# Patient Record
Sex: Female | Born: 1945
Health system: Southern US, Community
[De-identification: ages and names within clinical notes are randomized; demographics above are authoritative.]

## PROBLEM LIST (undated history)

## (undated) ENCOUNTER — Ambulatory Visit: Source: Home / Self Care

## (undated) DIAGNOSIS — J45909 Unspecified asthma, uncomplicated: Secondary | ICD-10-CM

## (undated) DIAGNOSIS — F419 Anxiety disorder, unspecified: Secondary | ICD-10-CM

## (undated) DIAGNOSIS — M109 Gout, unspecified: Secondary | ICD-10-CM

## (undated) DIAGNOSIS — F329 Major depressive disorder, single episode, unspecified: Secondary | ICD-10-CM

## (undated) DIAGNOSIS — M549 Dorsalgia, unspecified: Secondary | ICD-10-CM

## (undated) DIAGNOSIS — M779 Enthesopathy, unspecified: Secondary | ICD-10-CM

## (undated) DIAGNOSIS — N182 Chronic kidney disease, stage 2 (mild): Secondary | ICD-10-CM

## (undated) DIAGNOSIS — R7303 Prediabetes: Secondary | ICD-10-CM

## (undated) DIAGNOSIS — R011 Cardiac murmur, unspecified: Secondary | ICD-10-CM

## (undated) DIAGNOSIS — D696 Thrombocytopenia, unspecified: Secondary | ICD-10-CM

## (undated) DIAGNOSIS — E669 Obesity, unspecified: Secondary | ICD-10-CM

## (undated) DIAGNOSIS — M1990 Unspecified osteoarthritis, unspecified site: Secondary | ICD-10-CM

## (undated) DIAGNOSIS — I10 Essential (primary) hypertension: Secondary | ICD-10-CM

## (undated) DIAGNOSIS — E785 Hyperlipidemia, unspecified: Secondary | ICD-10-CM

## (undated) DIAGNOSIS — R0602 Shortness of breath: Secondary | ICD-10-CM

## (undated) DIAGNOSIS — R12 Heartburn: Secondary | ICD-10-CM

## (undated) DIAGNOSIS — L309 Dermatitis, unspecified: Secondary | ICD-10-CM

## (undated) DIAGNOSIS — M2550 Pain in unspecified joint: Secondary | ICD-10-CM

## (undated) DIAGNOSIS — F32A Depression, unspecified: Secondary | ICD-10-CM

## (undated) HISTORY — DX: Pain in unspecified joint: M25.50

## (undated) HISTORY — DX: Obesity, unspecified: E66.9

## (undated) HISTORY — DX: Dorsalgia, unspecified: M54.9

## (undated) HISTORY — DX: Chronic kidney disease, stage 2 (mild): N18.2

## (undated) HISTORY — DX: Enthesopathy, unspecified: M77.9

## (undated) HISTORY — DX: Prediabetes: R73.03

## (undated) HISTORY — DX: Shortness of breath: R06.02

## (undated) HISTORY — DX: Unspecified osteoarthritis, unspecified site: M19.90

## (undated) HISTORY — DX: Heartburn: R12

---

## 1989-04-08 HISTORY — PX: ABDOMINAL HYSTERECTOMY: SHX81

## 1999-01-19 ENCOUNTER — Encounter: Admission: RE | Admit: 1999-01-19 | Discharge: 1999-01-19 | Payer: Self-pay | Admitting: *Deleted

## 1999-11-13 ENCOUNTER — Encounter: Admission: RE | Admit: 1999-11-13 | Discharge: 2000-02-11 | Payer: Self-pay | Admitting: *Deleted

## 2000-02-11 ENCOUNTER — Encounter: Admission: RE | Admit: 2000-02-11 | Discharge: 2000-02-11 | Payer: Self-pay | Admitting: *Deleted

## 2000-02-11 ENCOUNTER — Encounter: Payer: Self-pay | Admitting: *Deleted

## 2000-11-04 ENCOUNTER — Other Ambulatory Visit: Admission: RE | Admit: 2000-11-04 | Discharge: 2000-11-04 | Payer: Self-pay | Admitting: *Deleted

## 2001-01-30 HISTORY — PX: NM MYOCAR PERF WALL MOTION: HXRAD629

## 2001-02-20 ENCOUNTER — Encounter: Admission: RE | Admit: 2001-02-20 | Discharge: 2001-02-20 | Payer: Self-pay | Admitting: *Deleted

## 2001-02-20 ENCOUNTER — Encounter: Payer: Self-pay | Admitting: *Deleted

## 2001-11-23 ENCOUNTER — Encounter: Admission: RE | Admit: 2001-11-23 | Discharge: 2001-11-23 | Payer: Self-pay | Admitting: Family Medicine

## 2001-11-23 ENCOUNTER — Encounter: Payer: Self-pay | Admitting: Family Medicine

## 2002-02-22 ENCOUNTER — Encounter: Admission: RE | Admit: 2002-02-22 | Discharge: 2002-02-22 | Payer: Self-pay | Admitting: Family Medicine

## 2002-02-22 ENCOUNTER — Encounter: Payer: Self-pay | Admitting: Family Medicine

## 2003-02-08 ENCOUNTER — Encounter: Admission: RE | Admit: 2003-02-08 | Discharge: 2003-05-09 | Payer: Self-pay | Admitting: Family Medicine

## 2003-02-25 ENCOUNTER — Encounter: Admission: RE | Admit: 2003-02-25 | Discharge: 2003-02-25 | Payer: Self-pay | Admitting: Family Medicine

## 2003-04-07 ENCOUNTER — Ambulatory Visit: Admission: RE | Admit: 2003-04-07 | Discharge: 2003-04-07 | Payer: Self-pay | Admitting: Family Medicine

## 2003-05-11 ENCOUNTER — Encounter: Admission: RE | Admit: 2003-05-11 | Discharge: 2003-05-11 | Payer: Self-pay | Admitting: Family Medicine

## 2004-03-02 ENCOUNTER — Encounter: Admission: RE | Admit: 2004-03-02 | Discharge: 2004-03-02 | Payer: Self-pay | Admitting: Family Medicine

## 2004-12-19 ENCOUNTER — Encounter: Admission: RE | Admit: 2004-12-19 | Discharge: 2004-12-19 | Payer: Self-pay | Admitting: Family Medicine

## 2005-12-23 ENCOUNTER — Encounter: Admission: RE | Admit: 2005-12-23 | Discharge: 2005-12-23 | Payer: Self-pay | Admitting: Family Medicine

## 2006-01-25 ENCOUNTER — Emergency Department (HOSPITAL_COMMUNITY): Admission: EM | Admit: 2006-01-25 | Discharge: 2006-01-25 | Payer: Self-pay | Admitting: Emergency Medicine

## 2006-02-20 ENCOUNTER — Ambulatory Visit: Payer: Self-pay | Admitting: Hematology and Oncology

## 2006-03-05 LAB — CBC WITH DIFFERENTIAL/PLATELET
BASO%: 0.2 % (ref 0.0–2.0)
Basophils Absolute: 0 10*3/uL (ref 0.0–0.1)
EOS%: 0.9 % (ref 0.0–7.0)
Eosinophils Absolute: 0.1 10*3/uL (ref 0.0–0.5)
HCT: 38.6 % (ref 34.8–46.6)
HGB: 13.1 g/dL (ref 11.6–15.9)
LYMPH%: 19.4 % (ref 14.0–48.0)
MCH: 27.7 pg (ref 26.0–34.0)
MCHC: 33.8 g/dL (ref 32.0–36.0)
MCV: 81.9 fL (ref 81.0–101.0)
MONO#: 0.6 10*3/uL (ref 0.1–0.9)
MONO%: 6.5 % (ref 0.0–13.0)
NEUT#: 6.4 10*3/uL (ref 1.5–6.5)
NEUT%: 73 % (ref 39.6–76.8)
Platelets: 502 10*3/uL — ABNORMAL HIGH (ref 145–400)
RBC: 4.71 10*6/uL (ref 3.70–5.32)
RDW: 14.9 % — ABNORMAL HIGH (ref 11.3–14.5)
WBC: 8.7 10*3/uL (ref 3.9–10.0)
lymph#: 1.7 10*3/uL (ref 0.9–3.3)

## 2006-03-05 LAB — ERYTHROCYTE SEDIMENTATION RATE: Sed Rate: 39 mm/hr (ref 0–30)

## 2006-03-08 HISTORY — PX: DOPPLER ECHOCARDIOGRAPHY: SHX263

## 2006-03-11 LAB — BCR/ABL GENE REARRANGEMENT QNT, PCR
BCR/ABL Quantitative 1: NEGATIVE
BCR/ABL ratio: 0

## 2006-03-11 LAB — COMPREHENSIVE METABOLIC PANEL
ALT: 17 U/L (ref 0–35)
AST: 19 U/L (ref 0–37)
Albumin: 4.5 g/dL (ref 3.5–5.2)
Alkaline Phosphatase: 83 U/L (ref 39–117)
BUN: 17 mg/dL (ref 6–23)
CO2: 30 mEq/L (ref 19–32)
Calcium: 10.6 mg/dL — ABNORMAL HIGH (ref 8.4–10.5)
Chloride: 99 mEq/L (ref 96–112)
Creatinine, Ser: 0.84 mg/dL (ref 0.40–1.20)
Glucose, Bld: 96 mg/dL (ref 70–99)
Potassium: 3.5 mEq/L (ref 3.5–5.3)
Sodium: 140 mEq/L (ref 135–145)
Total Bilirubin: 0.6 mg/dL (ref 0.3–1.2)
Total Protein: 8.6 g/dL — ABNORMAL HIGH (ref 6.0–8.3)

## 2006-03-11 LAB — LACTATE DEHYDROGENASE: LDH: 190 U/L (ref 94–250)

## 2006-03-11 LAB — JAK2 GENOTYPR

## 2006-03-19 LAB — VON WILLEBRAND FACTOR MULTIMER
Factor-VIII Activity: 132 % (ref 75–150)
Ristocetin-Cofactor: 49 % — ABNORMAL LOW (ref 50–150)
Von Willebrand Ag: 98 % normal (ref 61–164)
Von Willebrand Multimers: NORMAL

## 2006-09-10 ENCOUNTER — Ambulatory Visit: Payer: Self-pay | Admitting: Hematology and Oncology

## 2006-09-12 LAB — BASIC METABOLIC PANEL
BUN: 12 mg/dL (ref 6–23)
CO2: 28 mEq/L (ref 19–32)
Calcium: 10.7 mg/dL — ABNORMAL HIGH (ref 8.4–10.5)
Chloride: 99 mEq/L (ref 96–112)
Creatinine, Ser: 0.8 mg/dL (ref 0.40–1.20)
Glucose, Bld: 75 mg/dL (ref 70–99)
Potassium: 3.5 mEq/L (ref 3.5–5.3)
Sodium: 139 mEq/L (ref 135–145)

## 2006-09-12 LAB — CBC WITH DIFFERENTIAL/PLATELET
BASO%: 1.2 % (ref 0.0–2.0)
Basophils Absolute: 0.1 10*3/uL (ref 0.0–0.1)
EOS%: 2 % (ref 0.0–7.0)
Eosinophils Absolute: 0.2 10*3/uL (ref 0.0–0.5)
HCT: 37 % (ref 34.8–46.6)
HGB: 12.7 g/dL (ref 11.6–15.9)
LYMPH%: 21.5 % (ref 14.0–48.0)
MCH: 27.4 pg (ref 26.0–34.0)
MCHC: 34.4 g/dL (ref 32.0–36.0)
MCV: 79.9 fL — ABNORMAL LOW (ref 81.0–101.0)
MONO#: 0.6 10*3/uL (ref 0.1–0.9)
MONO%: 7.5 % (ref 0.0–13.0)
NEUT#: 5.5 10*3/uL (ref 1.5–6.5)
NEUT%: 67.8 % (ref 39.6–76.8)
Platelets: 471 10*3/uL — ABNORMAL HIGH (ref 145–400)
RBC: 4.63 10*6/uL (ref 3.70–5.32)
RDW: 15.2 % — ABNORMAL HIGH (ref 11.3–14.5)
WBC: 8.2 10*3/uL (ref 3.9–10.0)
lymph#: 1.8 10*3/uL (ref 0.9–3.3)

## 2006-12-25 ENCOUNTER — Encounter: Admission: RE | Admit: 2006-12-25 | Discharge: 2006-12-25 | Payer: Self-pay | Admitting: Family Medicine

## 2007-03-11 ENCOUNTER — Ambulatory Visit: Payer: Self-pay | Admitting: Hematology and Oncology

## 2007-03-13 LAB — CBC WITH DIFFERENTIAL/PLATELET
BASO%: 0.3 % (ref 0.0–2.0)
Basophils Absolute: 0 10*3/uL (ref 0.0–0.1)
EOS%: 1.3 % (ref 0.0–7.0)
Eosinophils Absolute: 0.1 10*3/uL (ref 0.0–0.5)
HCT: 35.4 % (ref 34.8–46.6)
HGB: 12.4 g/dL (ref 11.6–15.9)
LYMPH%: 23.7 % (ref 14.0–48.0)
MCH: 28.1 pg (ref 26.0–34.0)
MCHC: 35 g/dL (ref 32.0–36.0)
MCV: 80.5 fL — ABNORMAL LOW (ref 81.0–101.0)
MONO#: 0.5 10*3/uL (ref 0.1–0.9)
MONO%: 6.5 % (ref 0.0–13.0)
NEUT#: 5.6 10*3/uL (ref 1.5–6.5)
NEUT%: 68.2 % (ref 39.6–76.8)
Platelets: 408 10*3/uL — ABNORMAL HIGH (ref 145–400)
RBC: 4.4 10*6/uL (ref 3.70–5.32)
RDW: 14.6 % — ABNORMAL HIGH (ref 11.3–14.5)
WBC: 8.1 10*3/uL (ref 3.9–10.0)
lymph#: 1.9 10*3/uL (ref 0.9–3.3)

## 2007-03-13 LAB — BASIC METABOLIC PANEL
BUN: 11 mg/dL (ref 6–23)
CO2: 30 mEq/L (ref 19–32)
Calcium: 9.9 mg/dL (ref 8.4–10.5)
Chloride: 98 mEq/L (ref 96–112)
Creatinine, Ser: 0.75 mg/dL (ref 0.40–1.20)
Glucose, Bld: 94 mg/dL (ref 70–99)
Potassium: 3.7 mEq/L (ref 3.5–5.3)
Sodium: 137 mEq/L (ref 135–145)

## 2007-03-13 LAB — HEMOGLOBIN A1C: Hgb A1c MFr Bld: 6.1 % (ref 4.6–6.1)

## 2007-03-17 LAB — ANA: Anti Nuclear Antibody(ANA): NEGATIVE

## 2007-12-29 ENCOUNTER — Encounter: Admission: RE | Admit: 2007-12-29 | Discharge: 2007-12-29 | Payer: Self-pay | Admitting: Family Medicine

## 2008-12-30 ENCOUNTER — Encounter: Admission: RE | Admit: 2008-12-30 | Discharge: 2008-12-30 | Payer: Self-pay | Admitting: Internal Medicine

## 2010-01-03 ENCOUNTER — Encounter: Admission: RE | Admit: 2010-01-03 | Discharge: 2010-01-03 | Payer: Self-pay | Admitting: Internal Medicine

## 2010-04-28 ENCOUNTER — Encounter: Payer: Self-pay | Admitting: Family Medicine

## 2010-12-31 ENCOUNTER — Other Ambulatory Visit: Payer: Self-pay | Admitting: Internal Medicine

## 2010-12-31 DIAGNOSIS — Z1231 Encounter for screening mammogram for malignant neoplasm of breast: Secondary | ICD-10-CM

## 2011-01-24 ENCOUNTER — Ambulatory Visit
Admission: RE | Admit: 2011-01-24 | Discharge: 2011-01-24 | Disposition: A | Discharge status: Home / Self Care | Payer: BC Managed Care – PPO | Source: Ambulatory Visit | Attending: Internal Medicine | Admitting: Internal Medicine

## 2011-01-24 DIAGNOSIS — Z1231 Encounter for screening mammogram for malignant neoplasm of breast: Secondary | ICD-10-CM

## 2011-12-31 ENCOUNTER — Other Ambulatory Visit: Payer: Self-pay | Admitting: Internal Medicine

## 2011-12-31 DIAGNOSIS — Z1231 Encounter for screening mammogram for malignant neoplasm of breast: Secondary | ICD-10-CM

## 2012-01-16 HISTORY — PX: DOPPLER ECHOCARDIOGRAPHY: SHX263

## 2012-01-30 ENCOUNTER — Ambulatory Visit
Admission: RE | Admit: 2012-01-30 | Discharge: 2012-01-30 | Disposition: A | Discharge status: Home / Self Care | Payer: BC Managed Care – PPO | Source: Ambulatory Visit | Attending: Internal Medicine | Admitting: Internal Medicine

## 2012-01-30 DIAGNOSIS — Z1231 Encounter for screening mammogram for malignant neoplasm of breast: Secondary | ICD-10-CM

## 2012-04-10 ENCOUNTER — Emergency Department (HOSPITAL_COMMUNITY): Payer: BC Managed Care – PPO

## 2012-04-10 ENCOUNTER — Emergency Department (HOSPITAL_COMMUNITY)
Admission: EM | Admit: 2012-04-10 | Discharge: 2012-04-10 | Disposition: A | Discharge status: Home / Self Care | Payer: BC Managed Care – PPO | Attending: Emergency Medicine | Admitting: Emergency Medicine

## 2012-04-10 ENCOUNTER — Encounter (HOSPITAL_COMMUNITY): Payer: Self-pay | Admitting: Emergency Medicine

## 2012-04-10 DIAGNOSIS — F411 Generalized anxiety disorder: Secondary | ICD-10-CM | POA: Insufficient documentation

## 2012-04-10 DIAGNOSIS — R5381 Other malaise: Secondary | ICD-10-CM | POA: Insufficient documentation

## 2012-04-10 DIAGNOSIS — Z79899 Other long term (current) drug therapy: Secondary | ICD-10-CM | POA: Insufficient documentation

## 2012-04-10 DIAGNOSIS — R6883 Chills (without fever): Secondary | ICD-10-CM | POA: Insufficient documentation

## 2012-04-10 DIAGNOSIS — R42 Dizziness and giddiness: Secondary | ICD-10-CM | POA: Insufficient documentation

## 2012-04-10 DIAGNOSIS — J45909 Unspecified asthma, uncomplicated: Secondary | ICD-10-CM | POA: Insufficient documentation

## 2012-04-10 DIAGNOSIS — R05 Cough: Secondary | ICD-10-CM | POA: Insufficient documentation

## 2012-04-10 DIAGNOSIS — R197 Diarrhea, unspecified: Secondary | ICD-10-CM | POA: Insufficient documentation

## 2012-04-10 DIAGNOSIS — E785 Hyperlipidemia, unspecified: Secondary | ICD-10-CM | POA: Insufficient documentation

## 2012-04-10 DIAGNOSIS — R109 Unspecified abdominal pain: Secondary | ICD-10-CM | POA: Insufficient documentation

## 2012-04-10 DIAGNOSIS — I10 Essential (primary) hypertension: Secondary | ICD-10-CM | POA: Insufficient documentation

## 2012-04-10 DIAGNOSIS — Z872 Personal history of diseases of the skin and subcutaneous tissue: Secondary | ICD-10-CM | POA: Insufficient documentation

## 2012-04-10 DIAGNOSIS — D72829 Elevated white blood cell count, unspecified: Secondary | ICD-10-CM | POA: Insufficient documentation

## 2012-04-10 DIAGNOSIS — R5383 Other fatigue: Secondary | ICD-10-CM | POA: Insufficient documentation

## 2012-04-10 DIAGNOSIS — R059 Cough, unspecified: Secondary | ICD-10-CM | POA: Insufficient documentation

## 2012-04-10 DIAGNOSIS — R112 Nausea with vomiting, unspecified: Secondary | ICD-10-CM | POA: Insufficient documentation

## 2012-04-10 DIAGNOSIS — R51 Headache: Secondary | ICD-10-CM | POA: Insufficient documentation

## 2012-04-10 DIAGNOSIS — M109 Gout, unspecified: Secondary | ICD-10-CM | POA: Insufficient documentation

## 2012-04-10 DIAGNOSIS — R011 Cardiac murmur, unspecified: Secondary | ICD-10-CM | POA: Insufficient documentation

## 2012-04-10 DIAGNOSIS — Z8659 Personal history of other mental and behavioral disorders: Secondary | ICD-10-CM | POA: Insufficient documentation

## 2012-04-10 HISTORY — DX: Unspecified asthma, uncomplicated: J45.909

## 2012-04-10 HISTORY — DX: Gout, unspecified: M10.9

## 2012-04-10 HISTORY — DX: Essential (primary) hypertension: I10

## 2012-04-10 HISTORY — DX: Depression, unspecified: F32.A

## 2012-04-10 HISTORY — DX: Anxiety disorder, unspecified: F41.9

## 2012-04-10 HISTORY — DX: Dermatitis, unspecified: L30.9

## 2012-04-10 HISTORY — DX: Major depressive disorder, single episode, unspecified: F32.9

## 2012-04-10 HISTORY — DX: Hyperlipidemia, unspecified: E78.5

## 2012-04-10 HISTORY — DX: Thrombocytopenia, unspecified: D69.6

## 2012-04-10 HISTORY — DX: Cardiac murmur, unspecified: R01.1

## 2012-04-10 LAB — COMPREHENSIVE METABOLIC PANEL
ALT: 20 U/L (ref 0–35)
AST: 24 U/L (ref 0–37)
Albumin: 4.2 g/dL (ref 3.5–5.2)
Alkaline Phosphatase: 102 U/L (ref 39–117)
BUN: 17 mg/dL (ref 6–23)
CO2: 26 mEq/L (ref 19–32)
Calcium: 10.6 mg/dL — ABNORMAL HIGH (ref 8.4–10.5)
Chloride: 96 mEq/L (ref 96–112)
Creatinine, Ser: 1 mg/dL (ref 0.50–1.10)
GFR calc Af Amer: 67 mL/min — ABNORMAL LOW (ref >90)
GFR calc non Af Amer: 58 mL/min — ABNORMAL LOW (ref >90)
Glucose, Bld: 125 mg/dL — ABNORMAL HIGH (ref 70–99)
Potassium: 3.5 mEq/L (ref 3.5–5.1)
Sodium: 137 mEq/L (ref 135–145)
Total Bilirubin: 0.7 mg/dL (ref 0.3–1.2)
Total Protein: 9 g/dL — ABNORMAL HIGH (ref 6.0–8.3)

## 2012-04-10 LAB — CBC WITH DIFFERENTIAL/PLATELET
Basophils Absolute: 0 10*3/uL (ref 0.0–0.1)
Basophils Relative: 0 % (ref 0–1)
Eosinophils Absolute: 0 10*3/uL (ref 0.0–0.7)
Eosinophils Relative: 0 % (ref 0–5)
HCT: 38.1 % (ref 36.0–46.0)
Hemoglobin: 13.1 g/dL (ref 12.0–15.0)
Lymphocytes Relative: 1 % — ABNORMAL LOW (ref 12–46)
Lymphs Abs: 0.2 10*3/uL — ABNORMAL LOW (ref 0.7–4.0)
MCH: 26.4 pg (ref 26.0–34.0)
MCHC: 34.4 g/dL (ref 30.0–36.0)
MCV: 76.7 fL — ABNORMAL LOW (ref 78.0–100.0)
Monocytes Absolute: 0.4 10*3/uL (ref 0.1–1.0)
Monocytes Relative: 3 % (ref 3–12)
Neutro Abs: 13.5 10*3/uL — ABNORMAL HIGH (ref 1.7–7.7)
Neutrophils Relative %: 96 % — ABNORMAL HIGH (ref 43–77)
Platelets: 500 10*3/uL — ABNORMAL HIGH (ref 150–400)
RBC: 4.97 MIL/uL (ref 3.87–5.11)
RDW: 15 % (ref 11.5–15.5)
WBC: 14 10*3/uL — ABNORMAL HIGH (ref 4.0–10.5)

## 2012-04-10 LAB — LIPASE, BLOOD: Lipase: 25 U/L (ref 11–59)

## 2012-04-10 MED ORDER — ONDANSETRON HCL 4 MG/2ML IJ SOLN
4.0000 mg | Freq: Once | INTRAMUSCULAR | Status: AC
  Administered 2012-04-10: 4 mg via INTRAVENOUS
  Filled 2012-04-10: qty 2

## 2012-04-10 MED ORDER — ONDANSETRON 8 MG PO TBDP: 8.0000 mg | ORAL_TABLET | Freq: Three times a day (TID) | ORAL | Status: DC | PRN

## 2012-04-10 MED ORDER — SODIUM CHLORIDE 0.9 % IV BOLUS (SEPSIS)
1000.0000 mL | Freq: Once | INTRAVENOUS | Status: AC
  Administered 2012-04-10: 1000 mL via INTRAVENOUS

## 2012-04-10 MED ORDER — SODIUM CHLORIDE 0.9 % IV SOLN: Freq: Once | INTRAVENOUS | Status: DC

## 2012-04-10 MED ORDER — ACETAMINOPHEN 325 MG PO TABS
650.0000 mg | ORAL_TABLET | Freq: Once | ORAL | Status: AC
  Administered 2012-04-10: 650 mg via ORAL
  Filled 2012-04-10: qty 2

## 2012-04-10 MED ORDER — DIPHENOXYLATE-ATROPINE 2.5-0.025 MG PO TABS
1.0000 | ORAL_TABLET | Freq: Once | ORAL | Status: AC
  Administered 2012-04-10: 1 via ORAL
  Filled 2012-04-10: qty 1

## 2012-04-10 NOTE — ED Notes (Addendum)
Pt here via ems for c/o n/v/ started last pm diarrhea,that started this am

## 2012-04-10 NOTE — ED Provider Notes (Signed)
History     CSN: [id number]  Arrival date & time 04/10/12  4098   First MD Initiated Contact with Patient 04/10/12 1018      Chief Complaint  Patient presents with  . Emesis    (Consider location/radiation/quality/duration/timing/severity/associated sxs/prior treatment) HPI Sonia Beck is a 67 y.o. female who presents to ED with complaint of nausea, vomiting, abdominal pain onset around 11pm last night. States started with watery diarrhea, followed by abdominal discomfort and vomiting. States unable to keep anything down including sips of water this morning. Reports also cough, chills. Nothing making symptoms better or worse. Reports more than 20 episodes of watery diarrhea over night and vomiting. Did not try taking any medications prior to coming in. No hx of abdominal problems or surgeries.   Past Medical History  Diagnosis Date  . Hypertension   . Hyperlipidemia   . Gout   . Depression   . Asthma   . Cardiac murmur   . Eczema   . Thrombocytopenia   . Anxiety     No past surgical history on file.  No family history on file.  History  Substance Use Topics  . Smoking status: Never Smoker   . Smokeless tobacco: Not on file  . Alcohol Use: No    OB History    Grav Para Term Preterm Abortions TAB SAB Ect Mult Living                  Review of Systems  Constitutional: Positive for chills and fatigue. Negative for fever.  HENT: Negative for congestion, sore throat, neck pain and neck stiffness.   Respiratory: Positive for cough. Negative for chest tightness and shortness of breath.   Cardiovascular: Negative.   Gastrointestinal: Positive for nausea, vomiting, abdominal pain and diarrhea. Negative for blood in stool.  Genitourinary: Negative for dysuria, frequency and flank pain.  Musculoskeletal: Negative for myalgias.  Skin: Negative.   Neurological: Positive for dizziness, weakness and light-headedness.  Psychiatric/Behavioral: The patient is  nervous/anxious.     Allergies  Review of patient's allergies indicates not on file.  Home Medications  No current outpatient prescriptions on file.  BP 175/82  Pulse 83  Temp 98.5 F (36.9 C) (Oral)  Resp 20  SpO2 96%  Physical Exam  Nursing note and vitals reviewed. Constitutional: She is oriented to person, place, and time. She appears well-developed and well-nourished. No distress.  Eyes: Conjunctivae normal are normal.  Neck: Neck supple.  Cardiovascular: Normal rate, regular rhythm and normal heart sounds.   Pulmonary/Chest: Effort normal and breath sounds normal. No respiratory distress. She has no wheezes. She has no rales.  Abdominal: Soft. Bowel sounds are normal. She exhibits no distension. There is tenderness. There is no rebound and no guarding.       Diffuse abdominal tenderness  Musculoskeletal: She exhibits no edema.  Neurological: She is alert and oriented to person, place, and time.  Skin: Skin is warm and dry.  Psychiatric: She has a normal mood and affect. Her behavior is normal.    ED Course  Procedures (including critical care time)  10:42 AM Pt seen and examined, pt with n/v/d onset abut 12 hrs ago. Pt non toxic appearing. No acute abdomen on exam. VS normal. Will get labs, IVfluids, anti emetics.   Results for orders placed during the hospital encounter of 04/10/12  CBC WITH DIFFERENTIAL      Component Value Range   WBC 14.0 (*) 4.0 - 10.5 K/uL   RBC  4.97  3.87 - 5.11 MIL/uL   Hemoglobin 13.1  12.0 - 15.0 g/dL   HCT 16.1  09.6 - 04.5 %   MCV 76.7 (*) 78.0 - 100.0 fL   MCH 26.4  26.0 - 34.0 pg   MCHC 34.4  30.0 - 36.0 g/dL   RDW 40.9  81.1 - 91.4 %   Platelets 500 (*) 150 - 400 K/uL   Neutrophils Relative 96 (*) 43 - 77 %   Neutro Abs 13.5 (*) 1.7 - 7.7 K/uL   Lymphocytes Relative 1 (*) 12 - 46 %   Lymphs Abs 0.2 (*) 0.7 - 4.0 K/uL   Monocytes Relative 3  3 - 12 %   Monocytes Absolute 0.4  0.1 - 1.0 K/uL   Eosinophils Relative 0  0 - 5 %     Eosinophils Absolute 0.0  0.0 - 0.7 K/uL   Basophils Relative 0  0 - 1 %   Basophils Absolute 0.0  0.0 - 0.1 K/uL  COMPREHENSIVE METABOLIC PANEL      Component Value Range   Sodium 137  135 - 145 mEq/L   Potassium 3.5  3.5 - 5.1 mEq/L   Chloride 96  96 - 112 mEq/L   CO2 26  19 - 32 mEq/L   Glucose, Bld 125 (*) 70 - 99 mg/dL   BUN 17  6 - 23 mg/dL   Creatinine, Ser 7.82  0.50 - 1.10 mg/dL   Calcium 95.6 (*) 8.4 - 10.5 mg/dL   Total Protein 9.0 (*) 6.0 - 8.3 g/dL   Albumin 4.2  3.5 - 5.2 g/dL   AST 24  0 - 37 U/L   ALT 20  0 - 35 U/L   Alkaline Phosphatase 102  39 - 117 U/L   Total Bilirubin 0.7  0.3 - 1.2 mg/dL   GFR calc non Af Amer 58 (*) >90 mL/min   GFR calc Af Amer 67 (*) >90 mL/min  LIPASE, BLOOD      Component Value Range   Lipase 25  11 - 59 U/L   Dg Abd Acute W/chest  04/10/2012  *RADIOLOGY REPORT*  Clinical Data: Nausea, vomiting, diarrhea, abdominal pain  ACUTE ABDOMEN SERIES (ABDOMEN 2 VIEW & CHEST 1 VIEW)  Comparison: None.  Findings:  Normal cardiac silhouette and mediastinal contours.  There is mild diffuse thickening of the interstitium.  There is mild elevation of the right hemidiaphragm.  No focal airspace opacity.  No definite pleural effusion or pneumothorax.  Nonobstructive bowel gas pattern.  No definite pneumoperitoneum, pneumatosis or portal venous gas.  Lumbar spine degenerative change is suspected though incompletely evaluated.  Degenerative change of the pubic symphysis.  IMPRESSION: 1.  Findings suggestive of airways disease.  No focal airspace opacities to suggest pneumonia. 2.  Nonobstructive bowel gas pattern.   Original Report Authenticated By: Tacey Ruiz, MD     2:00 PM Pt feeling much better, tolerating POs. Abdominal exam shows abdomen is soft, non tender at this time. Pt stable for d/c home with anti emetics    1. Nausea vomiting and diarrhea   2. Leukocytosis       MDM  Pt with n/v/d onset yesterday. Her labs and VS unremarkable other  than elevated WBC of 14.0.  Pt was treated with IV Fluids, zofran, lomotil. Her n/v improved, abdominal pain resolved, pt does continue to have diarrhea. Given non tender abdomen, doubt any peritonitis. Do not think any imaging is necessary at this time, however, instructed to  return if worsening. This was discussed with Dr. Rhunette Croft, who examined pt as well and agrees with the plan. Pt tolerating PO fluids prior to d/c home.   Filed Vitals:   04/10/12 1316  BP: 146/65  Pulse: 88  Temp: 100.2 F (37.9 C)  Resp: [address] Aleene Swanner, [place] 04/10/12 1417

## 2012-04-10 NOTE — ED Notes (Signed)
RN to obtain labs with start of IV 

## 2012-04-10 NOTE — ED Provider Notes (Signed)
Medical screening examination/treatment/procedure(s) were conducted as a shared visit with non-physician practitioner(s) and myself.  I personally evaluated the patient during the encounter.  Pt comes in with cc of abd pain. Pt had acute nausea, emesis, diarrhea. Has abd pain - mild, that is epigastric. She had leukocytosis, so I went to evaluate her for possible CT. Her abd exam at my evaluation is completely non tender. Pt has good followup, and she was requested to return to the ER immediately if she starts having fevers, worsening pain, vomiting, bloody diarrhea, lightheadedness.    Derwood Kaplan, MD 04/10/12 4132

## 2012-07-06 DIAGNOSIS — F329 Major depressive disorder, single episode, unspecified: Secondary | ICD-10-CM | POA: Diagnosis not present

## 2012-07-06 DIAGNOSIS — M109 Gout, unspecified: Secondary | ICD-10-CM | POA: Diagnosis not present

## 2012-07-06 DIAGNOSIS — I10 Essential (primary) hypertension: Secondary | ICD-10-CM | POA: Diagnosis not present

## 2012-07-06 DIAGNOSIS — E785 Hyperlipidemia, unspecified: Secondary | ICD-10-CM | POA: Diagnosis not present

## 2012-07-06 DIAGNOSIS — F3289 Other specified depressive episodes: Secondary | ICD-10-CM | POA: Diagnosis not present

## 2012-08-04 DIAGNOSIS — J309 Allergic rhinitis, unspecified: Secondary | ICD-10-CM | POA: Diagnosis not present

## 2012-09-12 DIAGNOSIS — R059 Cough, unspecified: Secondary | ICD-10-CM | POA: Diagnosis not present

## 2012-09-12 DIAGNOSIS — R05 Cough: Secondary | ICD-10-CM | POA: Diagnosis not present

## 2012-09-12 DIAGNOSIS — J019 Acute sinusitis, unspecified: Secondary | ICD-10-CM | POA: Diagnosis not present

## 2012-09-16 DIAGNOSIS — J019 Acute sinusitis, unspecified: Secondary | ICD-10-CM | POA: Diagnosis not present

## 2012-09-16 DIAGNOSIS — I10 Essential (primary) hypertension: Secondary | ICD-10-CM | POA: Diagnosis not present

## 2012-10-05 DIAGNOSIS — M109 Gout, unspecified: Secondary | ICD-10-CM | POA: Diagnosis not present

## 2012-10-05 DIAGNOSIS — I10 Essential (primary) hypertension: Secondary | ICD-10-CM | POA: Diagnosis not present

## 2012-10-12 DIAGNOSIS — I10 Essential (primary) hypertension: Secondary | ICD-10-CM | POA: Diagnosis not present

## 2012-10-12 DIAGNOSIS — E785 Hyperlipidemia, unspecified: Secondary | ICD-10-CM | POA: Diagnosis not present

## 2012-10-12 DIAGNOSIS — F411 Generalized anxiety disorder: Secondary | ICD-10-CM | POA: Diagnosis not present

## 2012-10-12 DIAGNOSIS — M109 Gout, unspecified: Secondary | ICD-10-CM | POA: Diagnosis not present

## 2012-11-08 ENCOUNTER — Encounter: Payer: Self-pay | Admitting: *Deleted

## 2012-11-09 ENCOUNTER — Encounter: Payer: Self-pay | Admitting: Cardiology

## 2012-11-10 ENCOUNTER — Ambulatory Visit (INDEPENDENT_AMBULATORY_CARE_PROVIDER_SITE_OTHER): Payer: Medicare Other | Admitting: Cardiology

## 2012-11-10 ENCOUNTER — Encounter: Payer: Self-pay | Admitting: Cardiology

## 2012-11-10 VITALS — BP 140/70 | HR 57 | Ht 61.0 in | Wt 159.4 lb

## 2012-11-10 DIAGNOSIS — E785 Hyperlipidemia, unspecified: Secondary | ICD-10-CM

## 2012-11-10 DIAGNOSIS — E669 Obesity, unspecified: Secondary | ICD-10-CM

## 2012-11-10 DIAGNOSIS — R011 Cardiac murmur, unspecified: Secondary | ICD-10-CM | POA: Diagnosis not present

## 2012-11-10 DIAGNOSIS — I10 Essential (primary) hypertension: Secondary | ICD-10-CM

## 2012-11-10 DIAGNOSIS — R9431 Abnormal electrocardiogram [ECG] [EKG]: Secondary | ICD-10-CM | POA: Diagnosis not present

## 2012-11-10 NOTE — Patient Instructions (Addendum)
No changes to current medication  You are doing very well. Follow up appointment in 2 years   .Your physician wants you to follow-up in 2 years -Dr Gar Gibbon will receive a reminder letter in the mail two months in advance. If you don't receive a letter, please call our office to schedule the follow-up appointment.

## 2012-11-11 ENCOUNTER — Other Ambulatory Visit: Payer: Self-pay

## 2012-11-13 ENCOUNTER — Encounter: Payer: Self-pay | Admitting: Cardiology

## 2012-11-13 DIAGNOSIS — E7849 Other hyperlipidemia: Secondary | ICD-10-CM | POA: Insufficient documentation

## 2012-11-13 DIAGNOSIS — E669 Obesity, unspecified: Secondary | ICD-10-CM | POA: Insufficient documentation

## 2012-11-13 DIAGNOSIS — R011 Cardiac murmur, unspecified: Secondary | ICD-10-CM | POA: Insufficient documentation

## 2012-11-13 DIAGNOSIS — I10 Essential (primary) hypertension: Secondary | ICD-10-CM | POA: Insufficient documentation

## 2012-11-13 DIAGNOSIS — R010 Benign and innocent cardiac murmurs: Secondary | ICD-10-CM | POA: Insufficient documentation

## 2012-11-13 NOTE — Progress Notes (Signed)
Patient ID: Sonia Beck, female   DOB: Jun 09, 1945, 67 y.o.   MRN: [id number] PCP: Thayer Headings, MD  Clinic Note: Chief Complaint  Patient presents with  . ROV 1 year    No complaints   HPI: Sonia Beck is a 67 y.o. female with a PMH below who presents today for one-year followup. Her most notable issue had been difficult to control blood pressure, and morbid obesity. Once our last year she weighed 224 pounds with a BMI of 41 she gained 14 pounds at that time. Her blood pressure 160/90 mmHg.  Interval History: Sonia Beck has been working with Dr. Thea Silversmith diligently over the past year with th MediFast diet plan that would exercise. This is a lifelong eating plan where she started off with a bars and certain meals and is now on the maintenance phase. She's lost 65 pounds. She is working out almost everyday of the week. 4 days a week at the [facility] to gradual exercise and cardiovascular status. On the other days she's walking at home. She looks remarkably well and is smiling. She is actually no complaints beside that she hopes she can keep her weight off. She is still off of losartan altogether. She is on half a dose of amlodipine now at 5 mg. She's cut her Crestor dose in half as well.  Otherwise, her cardiac review of systems is as follows: no chest pain or dyspnea on exertion negative for - edema, irregular heartbeat, loss of consciousness, murmur, orthopnea, palpitations, paroxysmal nocturnal dyspnea, rapid heart rate or shortness of breath. No lightheadedness, dizziness, wooziness, syncope/near-syncope. No TIA or amaurosis fugax in nature no melena, hematochezia nature it. No claudication.  Past Medical History  Diagnosis Date  . Hypertension   . Hyperlipidemia   . Gout   . Depression   . Asthma   . Cardiac murmur Echo October 2013    Aortic sclerosis, not stenosis  . Eczema   . Thrombocytopenia   . Anxiety   . Obesity (BMI 30-39.9)     Former weight 224  pounds with BMI of 41 -- 65 pound weight loss in one year    Prior Cardiac Evaluation and Past Surgical History: Past Surgical History  Procedure Laterality Date  . Doppler echocardiography  03/2006    ESSENTIALLY norm,andno valvular lesions despite hearing murmur  . Nm myocar perf wall motion  01/30/2001    EF 69% ,LOW RISK,mild ant wall thinning due to breast attenuation  . Doppler echocardiography  01/16/2012    ef =>55% lv norm   Allergies  Allergen Reactions  . Erythromycin     Stomach cramps.  . Fosamax (Alendronate)     Joint pain.   Current Outpatient Prescriptions  Medication Sig Dispense Refill  . albuterol (PROVENTIL HFA;VENTOLIN HFA) 108 (90 BASE) MCG/ACT inhaler Inhale 2 puffs into the lungs every 6 (six) hours as needed. For shortness of breath.      . ALPRAZolam (XANAX) 0.25 MG tablet Take 0.25 mg by mouth 2 (two) times daily as needed. For anxiety.      [name] Kitchen amLODipine (NORVASC) 5 MG tablet Take 5 mg by mouth daily.      . budesonide (PULMICORT) 180 MCG/ACT inhaler Inhale 1 puff into the lungs 2 (two) times daily.      . calcium-vitamin D (OSCAL WITH D) 500-200 MG-UNIT per tablet Take 1 tablet by mouth daily.      [name] Kitchen FLUoxetine (PROZAC) 20 MG capsule Take 20 mg by mouth  daily.      . montelukast (SINGULAIR) 10 MG tablet Take 10 mg by mouth at bedtime.      . potassium chloride SA (K-DUR,KLOR-CON) 20 MEQ tablet Take 20 mEq by mouth daily.      . rosuvastatin (CRESTOR) 5 MG tablet Take 5 mg by mouth daily.      [name] Kitchen triamterene-hydrochlorothiazide (MAXZIDE-25) 37.5-25 MG per tablet Take 1 tablet by mouth daily.       No current facility-administered medications for this visit.    History   Social History  . Marital Status: Single    Spouse Name: N/A    Number of Children: N/A  . Years of Education: N/A   Occupational History  . Not on file.   Social History Main Topics  . Smoking status: Never Smoker   . Smokeless tobacco: Never Used  . Alcohol Use: No  .  Drug Use: No  . Sexually Active: Not on file   Other Topics Concern  . Not on file   Social History Narrative   She is a single with one child and adopted grandson who lives with her. She does not drink or smoke.      She is now on the maintenance phase of the MediFast diet. Exercising at least 4 times a week at the [facility] doing cardio exercises, otherwise walking at home and the other days, and remaining active otherwise.   ROS: A comprehensive Review of Systems - Negative except Minor aches and pains if she overdoes it with exercise.  PHYSICAL EXAM BP 140/70  Pulse 57  Ht 5\' 1"  (1.549 m)  Wt 159 lb 6.4 oz (72.303 kg)  BMI 30.13 kg/m2 General appearance: alert, cooperative, appears stated age, no distress, moderately obese and well-nourished and well-groomed. Dramatically improved weight overall. Very pleasant mood mass effect. HEENT: [place]/AT, EOMI, MMM, anicteric sclera Neck: no adenopathy, no carotid bruit, no JVD, supple, symmetrical, trachea midline and thyroid not enlarged, symmetric, no tenderness/mass/nodules Lungs: clear to auscultation bilaterally, normal percussion bilaterally and nonlabored, good air movement Heart: regular rate and rhythm, S1, S2 normal, no S3 or S4, systolic murmur: systolic ejection 1/6, crescendo, decrescendo and Early peaking at 2nd right intercostal space, no click and no rub Abdomen: soft, non-tender; bowel sounds normal; no masses,  no organomegaly and remains mildly obese but much less so Extremities: extremities normal, atraumatic, no cyanosis or edema, no edema, redness or tenderness in the calves or thighs and no ulcers, gangrene or trophic changes Pulses: 2+ and symmetric Neurologic: Grossly normal  [id number] today: Yes Rate: 57 , Rhythm: Sinus bradycardia, nonspecific ST-T changes, otherwise normal.  Recent Labs: 10/05/2012  Total cholesterol 135, triglycerides 45, HDL 53, LDL 73 -- outstanding  Chemistries notable for  potassium of 3.8, BUN/creatinine 15/0.8,  TSH low normal 0.4 to; uric acid 6.2  CBC stable with H&H 12.1/36.8, platelets 419 and WBC 8.5.  ASSESSMENT / PLAN: Extremely stable overall. Blood pressure much better controlled as is her cholesterol with weight loss.  I congratulated with her and Dr. Thea Silversmith for how well she has done with this diet and exercise program. [facility] [place] has gone into this with full force, and has reaped  the benefits.  Weight is stable she is, I think she is fine for 2 year followup. She can come back sooner if need be. She does want to go back to sleep for piece of mind sake but is happy to you that she is doing well enough to  be at the two-year followup visits.  Obesity (BMI 30-39.9) Very impressive diet and exercise plan with weight loss and 65 pounds. Now the focus will be on maintaining this loss. I congratulated her efforts. With these results her blood pressure and cholesterol will be better controlled.  She still is BMI of just over 30, so she still meets the obese criteria, but is almost below that threshold.  Cardiac murmur She usually do you have that her murmur was most from aortic sclerosis not stenosis. I explained her the difference, and she is very happy.  Hypertension Much better control, with much less medication. She is actually cut back on the medications that we were having the pile on previously for poorly controlled hypertension.  Hyperlipidemia Excellent control. On lower dose of Crestor. Weight loss has had a dramatic affect was as well.   Orders Placed This Encounter  Procedures  . EKG 12-Lead   Meds ordered this encounter  Medication adjustments   . rosuvastatin (CRESTOR) 5 MG tablet    Sig: Take 5 mg by mouth daily.  [name] Kitchen triamterene-hydrochlorothiazide (MAXZIDE-25) 37.5-25 MG per tablet    Sig: Take 1 tablet by mouth daily.  [name] Kitchen amLODipine (NORVASC) 5 MG tablet    Sig: Take 5 mg by mouth daily.    Sonia Beck W. Herbie Baltimore, M.D., M.S. [facility]  [facility] [address] [place], [place]  [address]  [phone] Pager # [phone]

## 2012-11-13 NOTE — Assessment & Plan Note (Signed)
Much better control, with much less medication. She is actually cut back on the medications that we were having the pile on previously for poorly controlled hypertension.

## 2012-11-13 NOTE — Assessment & Plan Note (Addendum)
Very impressive diet and exercise plan with weight loss and 65 pounds. Now the focus will be on maintaining this loss. I congratulated her efforts. With these results her blood pressure and cholesterol will be better controlled.  She still is BMI of just over 30, so she still meets the obese criteria, but is almost below that threshold.

## 2012-11-13 NOTE — Assessment & Plan Note (Signed)
She usually do you have that her murmur was most from aortic sclerosis not stenosis. I explained her the difference, and she is very happy.

## 2012-11-13 NOTE — Assessment & Plan Note (Signed)
Excellent control. On lower dose of Crestor. Weight loss has had a dramatic affect was as well.

## 2012-12-02 ENCOUNTER — Telehealth: Payer: Self-pay | Admitting: *Deleted

## 2012-12-02 NOTE — Telephone Encounter (Signed)
Error

## 2012-12-15 DIAGNOSIS — M6281 Muscle weakness (generalized): Secondary | ICD-10-CM | POA: Diagnosis not present

## 2012-12-15 DIAGNOSIS — M19079 Primary osteoarthritis, unspecified ankle and foot: Secondary | ICD-10-CM | POA: Diagnosis not present

## 2012-12-17 DIAGNOSIS — M19079 Primary osteoarthritis, unspecified ankle and foot: Secondary | ICD-10-CM | POA: Diagnosis not present

## 2012-12-17 DIAGNOSIS — M6281 Muscle weakness (generalized): Secondary | ICD-10-CM | POA: Diagnosis not present

## 2012-12-22 DIAGNOSIS — M19079 Primary osteoarthritis, unspecified ankle and foot: Secondary | ICD-10-CM | POA: Diagnosis not present

## 2012-12-22 DIAGNOSIS — M6281 Muscle weakness (generalized): Secondary | ICD-10-CM | POA: Diagnosis not present

## 2012-12-24 DIAGNOSIS — M19079 Primary osteoarthritis, unspecified ankle and foot: Secondary | ICD-10-CM | POA: Diagnosis not present

## 2012-12-24 DIAGNOSIS — M6281 Muscle weakness (generalized): Secondary | ICD-10-CM | POA: Diagnosis not present

## 2013-01-05 DIAGNOSIS — M6281 Muscle weakness (generalized): Secondary | ICD-10-CM | POA: Diagnosis not present

## 2013-01-05 DIAGNOSIS — M19079 Primary osteoarthritis, unspecified ankle and foot: Secondary | ICD-10-CM | POA: Diagnosis not present

## 2013-01-19 DIAGNOSIS — M6281 Muscle weakness (generalized): Secondary | ICD-10-CM | POA: Diagnosis not present

## 2013-01-19 DIAGNOSIS — M19079 Primary osteoarthritis, unspecified ankle and foot: Secondary | ICD-10-CM | POA: Diagnosis not present

## 2013-01-21 DIAGNOSIS — M6281 Muscle weakness (generalized): Secondary | ICD-10-CM | POA: Diagnosis not present

## 2013-01-21 DIAGNOSIS — H2510 Age-related nuclear cataract, unspecified eye: Secondary | ICD-10-CM | POA: Diagnosis not present

## 2013-01-21 DIAGNOSIS — I10 Essential (primary) hypertension: Secondary | ICD-10-CM | POA: Diagnosis not present

## 2013-01-21 DIAGNOSIS — H5210 Myopia, unspecified eye: Secondary | ICD-10-CM | POA: Diagnosis not present

## 2013-01-21 DIAGNOSIS — H25099 Other age-related incipient cataract, unspecified eye: Secondary | ICD-10-CM | POA: Diagnosis not present

## 2013-01-21 DIAGNOSIS — M19079 Primary osteoarthritis, unspecified ankle and foot: Secondary | ICD-10-CM | POA: Diagnosis not present

## 2013-01-21 DIAGNOSIS — H52229 Regular astigmatism, unspecified eye: Secondary | ICD-10-CM | POA: Diagnosis not present

## 2013-01-26 ENCOUNTER — Other Ambulatory Visit: Payer: Self-pay

## 2013-01-26 DIAGNOSIS — M19079 Primary osteoarthritis, unspecified ankle and foot: Secondary | ICD-10-CM | POA: Diagnosis not present

## 2013-01-26 DIAGNOSIS — M6281 Muscle weakness (generalized): Secondary | ICD-10-CM | POA: Diagnosis not present

## 2013-01-26 DIAGNOSIS — Z1231 Encounter for screening mammogram for malignant neoplasm of breast: Secondary | ICD-10-CM

## 2013-01-28 DIAGNOSIS — I10 Essential (primary) hypertension: Secondary | ICD-10-CM | POA: Diagnosis not present

## 2013-01-28 DIAGNOSIS — E785 Hyperlipidemia, unspecified: Secondary | ICD-10-CM | POA: Diagnosis not present

## 2013-01-28 DIAGNOSIS — Z23 Encounter for immunization: Secondary | ICD-10-CM | POA: Diagnosis not present

## 2013-01-28 DIAGNOSIS — M109 Gout, unspecified: Secondary | ICD-10-CM | POA: Diagnosis not present

## 2013-01-28 DIAGNOSIS — F411 Generalized anxiety disorder: Secondary | ICD-10-CM | POA: Diagnosis not present

## 2013-02-02 DIAGNOSIS — M6281 Muscle weakness (generalized): Secondary | ICD-10-CM | POA: Diagnosis not present

## 2013-02-02 DIAGNOSIS — M19079 Primary osteoarthritis, unspecified ankle and foot: Secondary | ICD-10-CM | POA: Diagnosis not present

## 2013-02-04 DIAGNOSIS — M19079 Primary osteoarthritis, unspecified ankle and foot: Secondary | ICD-10-CM | POA: Diagnosis not present

## 2013-02-04 DIAGNOSIS — M6281 Muscle weakness (generalized): Secondary | ICD-10-CM | POA: Diagnosis not present

## 2013-02-09 DIAGNOSIS — M6281 Muscle weakness (generalized): Secondary | ICD-10-CM | POA: Diagnosis not present

## 2013-02-09 DIAGNOSIS — M19079 Primary osteoarthritis, unspecified ankle and foot: Secondary | ICD-10-CM | POA: Diagnosis not present

## 2013-02-11 ENCOUNTER — Other Ambulatory Visit: Payer: Self-pay

## 2013-02-11 DIAGNOSIS — M19079 Primary osteoarthritis, unspecified ankle and foot: Secondary | ICD-10-CM | POA: Diagnosis not present

## 2013-02-11 DIAGNOSIS — M6281 Muscle weakness (generalized): Secondary | ICD-10-CM | POA: Diagnosis not present

## 2013-02-18 DIAGNOSIS — M6281 Muscle weakness (generalized): Secondary | ICD-10-CM | POA: Diagnosis not present

## 2013-02-18 DIAGNOSIS — M19079 Primary osteoarthritis, unspecified ankle and foot: Secondary | ICD-10-CM | POA: Diagnosis not present

## 2013-02-23 ENCOUNTER — Ambulatory Visit
Admission: RE | Admit: 2013-02-23 | Discharge: 2013-02-23 | Disposition: A | Discharge status: Home / Self Care | Payer: Medicare Other | Source: Ambulatory Visit

## 2013-02-23 DIAGNOSIS — Z1231 Encounter for screening mammogram for malignant neoplasm of breast: Secondary | ICD-10-CM

## 2013-02-23 DIAGNOSIS — M6281 Muscle weakness (generalized): Secondary | ICD-10-CM | POA: Diagnosis not present

## 2013-02-23 DIAGNOSIS — M19079 Primary osteoarthritis, unspecified ankle and foot: Secondary | ICD-10-CM | POA: Diagnosis not present

## 2013-02-25 ENCOUNTER — Other Ambulatory Visit: Payer: Self-pay | Admitting: Internal Medicine

## 2013-02-25 DIAGNOSIS — M19079 Primary osteoarthritis, unspecified ankle and foot: Secondary | ICD-10-CM | POA: Diagnosis not present

## 2013-02-25 DIAGNOSIS — R928 Other abnormal and inconclusive findings on diagnostic imaging of breast: Secondary | ICD-10-CM

## 2013-02-25 DIAGNOSIS — Z23 Encounter for immunization: Secondary | ICD-10-CM | POA: Diagnosis not present

## 2013-02-25 DIAGNOSIS — M6281 Muscle weakness (generalized): Secondary | ICD-10-CM | POA: Diagnosis not present

## 2013-03-02 DIAGNOSIS — M6281 Muscle weakness (generalized): Secondary | ICD-10-CM | POA: Diagnosis not present

## 2013-03-02 DIAGNOSIS — M19079 Primary osteoarthritis, unspecified ankle and foot: Secondary | ICD-10-CM | POA: Diagnosis not present

## 2013-03-11 ENCOUNTER — Ambulatory Visit
Admission: RE | Admit: 2013-03-11 | Discharge: 2013-03-11 | Disposition: A | Discharge status: Home / Self Care | Payer: Medicare Other | Source: Ambulatory Visit | Attending: Internal Medicine | Admitting: Internal Medicine

## 2013-03-11 DIAGNOSIS — R928 Other abnormal and inconclusive findings on diagnostic imaging of breast: Secondary | ICD-10-CM

## 2013-03-16 DIAGNOSIS — M6281 Muscle weakness (generalized): Secondary | ICD-10-CM | POA: Diagnosis not present

## 2013-03-16 DIAGNOSIS — M19079 Primary osteoarthritis, unspecified ankle and foot: Secondary | ICD-10-CM | POA: Diagnosis not present

## 2013-03-23 DIAGNOSIS — M6281 Muscle weakness (generalized): Secondary | ICD-10-CM | POA: Diagnosis not present

## 2013-03-23 DIAGNOSIS — M19079 Primary osteoarthritis, unspecified ankle and foot: Secondary | ICD-10-CM | POA: Diagnosis not present

## 2013-03-25 DIAGNOSIS — M19079 Primary osteoarthritis, unspecified ankle and foot: Secondary | ICD-10-CM | POA: Diagnosis not present

## 2013-03-25 DIAGNOSIS — M6281 Muscle weakness (generalized): Secondary | ICD-10-CM | POA: Diagnosis not present

## 2013-03-30 DIAGNOSIS — R110 Nausea: Secondary | ICD-10-CM | POA: Diagnosis not present

## 2013-03-30 DIAGNOSIS — J019 Acute sinusitis, unspecified: Secondary | ICD-10-CM | POA: Diagnosis not present

## 2013-03-30 DIAGNOSIS — R300 Dysuria: Secondary | ICD-10-CM | POA: Diagnosis not present

## 2013-05-04 DIAGNOSIS — M19079 Primary osteoarthritis, unspecified ankle and foot: Secondary | ICD-10-CM | POA: Diagnosis not present

## 2013-05-04 DIAGNOSIS — M6281 Muscle weakness (generalized): Secondary | ICD-10-CM | POA: Diagnosis not present

## 2013-05-06 DIAGNOSIS — M6281 Muscle weakness (generalized): Secondary | ICD-10-CM | POA: Diagnosis not present

## 2013-05-06 DIAGNOSIS — M19079 Primary osteoarthritis, unspecified ankle and foot: Secondary | ICD-10-CM | POA: Diagnosis not present

## 2013-05-11 DIAGNOSIS — M6281 Muscle weakness (generalized): Secondary | ICD-10-CM | POA: Diagnosis not present

## 2013-05-11 DIAGNOSIS — M19079 Primary osteoarthritis, unspecified ankle and foot: Secondary | ICD-10-CM | POA: Diagnosis not present

## 2013-05-13 DIAGNOSIS — M6281 Muscle weakness (generalized): Secondary | ICD-10-CM | POA: Diagnosis not present

## 2013-05-13 DIAGNOSIS — M19079 Primary osteoarthritis, unspecified ankle and foot: Secondary | ICD-10-CM | POA: Diagnosis not present

## 2013-05-18 DIAGNOSIS — M19079 Primary osteoarthritis, unspecified ankle and foot: Secondary | ICD-10-CM | POA: Diagnosis not present

## 2013-05-18 DIAGNOSIS — M6281 Muscle weakness (generalized): Secondary | ICD-10-CM | POA: Diagnosis not present

## 2013-05-20 DIAGNOSIS — M6281 Muscle weakness (generalized): Secondary | ICD-10-CM | POA: Diagnosis not present

## 2013-05-20 DIAGNOSIS — M19079 Primary osteoarthritis, unspecified ankle and foot: Secondary | ICD-10-CM | POA: Diagnosis not present

## 2013-06-22 DIAGNOSIS — M19079 Primary osteoarthritis, unspecified ankle and foot: Secondary | ICD-10-CM | POA: Diagnosis not present

## 2013-06-22 DIAGNOSIS — M6281 Muscle weakness (generalized): Secondary | ICD-10-CM | POA: Diagnosis not present

## 2013-06-24 DIAGNOSIS — M6281 Muscle weakness (generalized): Secondary | ICD-10-CM | POA: Diagnosis not present

## 2013-06-24 DIAGNOSIS — M19079 Primary osteoarthritis, unspecified ankle and foot: Secondary | ICD-10-CM | POA: Diagnosis not present

## 2013-07-01 DIAGNOSIS — M19079 Primary osteoarthritis, unspecified ankle and foot: Secondary | ICD-10-CM | POA: Diagnosis not present

## 2013-07-01 DIAGNOSIS — M6281 Muscle weakness (generalized): Secondary | ICD-10-CM | POA: Diagnosis not present

## 2013-07-06 DIAGNOSIS — M19079 Primary osteoarthritis, unspecified ankle and foot: Secondary | ICD-10-CM | POA: Diagnosis not present

## 2013-07-06 DIAGNOSIS — M6281 Muscle weakness (generalized): Secondary | ICD-10-CM | POA: Diagnosis not present

## 2013-07-08 DIAGNOSIS — M19079 Primary osteoarthritis, unspecified ankle and foot: Secondary | ICD-10-CM | POA: Diagnosis not present

## 2013-07-08 DIAGNOSIS — M6281 Muscle weakness (generalized): Secondary | ICD-10-CM | POA: Diagnosis not present

## 2013-07-20 DIAGNOSIS — M6281 Muscle weakness (generalized): Secondary | ICD-10-CM | POA: Diagnosis not present

## 2013-07-20 DIAGNOSIS — M19079 Primary osteoarthritis, unspecified ankle and foot: Secondary | ICD-10-CM | POA: Diagnosis not present

## 2013-07-22 DIAGNOSIS — M19079 Primary osteoarthritis, unspecified ankle and foot: Secondary | ICD-10-CM | POA: Diagnosis not present

## 2013-07-22 DIAGNOSIS — M6281 Muscle weakness (generalized): Secondary | ICD-10-CM | POA: Diagnosis not present

## 2013-07-26 DIAGNOSIS — M899 Disorder of bone, unspecified: Secondary | ICD-10-CM | POA: Diagnosis not present

## 2013-07-26 DIAGNOSIS — Z0000 Encounter for general adult medical examination without abnormal findings: Secondary | ICD-10-CM | POA: Diagnosis not present

## 2013-07-26 DIAGNOSIS — E669 Obesity, unspecified: Secondary | ICD-10-CM | POA: Diagnosis not present

## 2013-07-26 DIAGNOSIS — I10 Essential (primary) hypertension: Secondary | ICD-10-CM | POA: Diagnosis not present

## 2013-07-26 DIAGNOSIS — M949 Disorder of cartilage, unspecified: Secondary | ICD-10-CM | POA: Diagnosis not present

## 2013-07-26 DIAGNOSIS — M109 Gout, unspecified: Secondary | ICD-10-CM | POA: Diagnosis not present

## 2013-07-27 DIAGNOSIS — M19079 Primary osteoarthritis, unspecified ankle and foot: Secondary | ICD-10-CM | POA: Diagnosis not present

## 2013-07-27 DIAGNOSIS — M6281 Muscle weakness (generalized): Secondary | ICD-10-CM | POA: Diagnosis not present

## 2013-08-02 ENCOUNTER — Other Ambulatory Visit: Payer: Self-pay | Admitting: Internal Medicine

## 2013-08-02 ENCOUNTER — Other Ambulatory Visit: Payer: Self-pay

## 2013-08-02 DIAGNOSIS — R921 Mammographic calcification found on diagnostic imaging of breast: Secondary | ICD-10-CM

## 2013-08-03 DIAGNOSIS — M6281 Muscle weakness (generalized): Secondary | ICD-10-CM | POA: Diagnosis not present

## 2013-08-03 DIAGNOSIS — M19079 Primary osteoarthritis, unspecified ankle and foot: Secondary | ICD-10-CM | POA: Diagnosis not present

## 2013-08-05 DIAGNOSIS — I10 Essential (primary) hypertension: Secondary | ICD-10-CM | POA: Diagnosis not present

## 2013-08-05 DIAGNOSIS — M109 Gout, unspecified: Secondary | ICD-10-CM | POA: Diagnosis not present

## 2013-08-05 DIAGNOSIS — M6281 Muscle weakness (generalized): Secondary | ICD-10-CM | POA: Diagnosis not present

## 2013-08-05 DIAGNOSIS — F329 Major depressive disorder, single episode, unspecified: Secondary | ICD-10-CM | POA: Diagnosis not present

## 2013-08-05 DIAGNOSIS — M19079 Primary osteoarthritis, unspecified ankle and foot: Secondary | ICD-10-CM | POA: Diagnosis not present

## 2013-08-05 DIAGNOSIS — E785 Hyperlipidemia, unspecified: Secondary | ICD-10-CM | POA: Diagnosis not present

## 2013-08-10 DIAGNOSIS — M6281 Muscle weakness (generalized): Secondary | ICD-10-CM | POA: Diagnosis not present

## 2013-08-10 DIAGNOSIS — M19079 Primary osteoarthritis, unspecified ankle and foot: Secondary | ICD-10-CM | POA: Diagnosis not present

## 2013-08-12 DIAGNOSIS — M19079 Primary osteoarthritis, unspecified ankle and foot: Secondary | ICD-10-CM | POA: Diagnosis not present

## 2013-08-12 DIAGNOSIS — M6281 Muscle weakness (generalized): Secondary | ICD-10-CM | POA: Diagnosis not present

## 2013-09-22 ENCOUNTER — Ambulatory Visit
Admission: RE | Admit: 2013-09-22 | Discharge: 2013-09-22 | Disposition: A | Discharge status: Home / Self Care | Payer: Medicare Other | Source: Ambulatory Visit | Attending: Internal Medicine | Admitting: Internal Medicine

## 2013-09-22 DIAGNOSIS — R928 Other abnormal and inconclusive findings on diagnostic imaging of breast: Secondary | ICD-10-CM | POA: Diagnosis not present

## 2013-09-22 DIAGNOSIS — R921 Mammographic calcification found on diagnostic imaging of breast: Secondary | ICD-10-CM

## 2013-09-30 ENCOUNTER — Telehealth: Payer: Self-pay | Admitting: Cardiology

## 2013-09-30 NOTE — Telephone Encounter (Signed)
Please call him,concerning Sonia Beck.

## 2013-10-04 NOTE — Telephone Encounter (Signed)
Dr Thea SilversmithMackenzie called again today,wanted to make sure Dr Herbie BaltimoreHarding had received his message. He wants to talk to him,concerning Sonia Beck.

## 2013-10-06 NOTE — Telephone Encounter (Signed)
Will try to call on Thursday -- will have time then.  Marykay LexHARDING,DAVID W, MD

## 2014-01-21 ENCOUNTER — Other Ambulatory Visit: Payer: Self-pay

## 2014-02-04 DIAGNOSIS — M109 Gout, unspecified: Secondary | ICD-10-CM | POA: Diagnosis not present

## 2014-02-04 DIAGNOSIS — I10 Essential (primary) hypertension: Secondary | ICD-10-CM | POA: Diagnosis not present

## 2014-02-04 DIAGNOSIS — M859 Disorder of bone density and structure, unspecified: Secondary | ICD-10-CM | POA: Diagnosis not present

## 2014-02-04 DIAGNOSIS — E785 Hyperlipidemia, unspecified: Secondary | ICD-10-CM | POA: Diagnosis not present

## 2014-02-11 DIAGNOSIS — E785 Hyperlipidemia, unspecified: Secondary | ICD-10-CM | POA: Diagnosis not present

## 2014-02-11 DIAGNOSIS — Z23 Encounter for immunization: Secondary | ICD-10-CM | POA: Diagnosis not present

## 2014-02-11 DIAGNOSIS — F329 Major depressive disorder, single episode, unspecified: Secondary | ICD-10-CM | POA: Diagnosis not present

## 2014-02-11 DIAGNOSIS — I10 Essential (primary) hypertension: Secondary | ICD-10-CM | POA: Diagnosis not present

## 2014-02-11 DIAGNOSIS — M109 Gout, unspecified: Secondary | ICD-10-CM | POA: Diagnosis not present

## 2014-03-09 DIAGNOSIS — H2513 Age-related nuclear cataract, bilateral: Secondary | ICD-10-CM | POA: Diagnosis not present

## 2014-03-09 DIAGNOSIS — H40013 Open angle with borderline findings, low risk, bilateral: Secondary | ICD-10-CM | POA: Diagnosis not present

## 2014-03-09 DIAGNOSIS — H25013 Cortical age-related cataract, bilateral: Secondary | ICD-10-CM | POA: Diagnosis not present

## 2014-03-22 ENCOUNTER — Other Ambulatory Visit: Payer: Self-pay | Admitting: Internal Medicine

## 2014-03-22 DIAGNOSIS — R921 Mammographic calcification found on diagnostic imaging of breast: Secondary | ICD-10-CM

## 2014-03-29 DIAGNOSIS — H40013 Open angle with borderline findings, low risk, bilateral: Secondary | ICD-10-CM | POA: Diagnosis not present

## 2014-04-07 ENCOUNTER — Ambulatory Visit
Admission: RE | Admit: 2014-04-07 | Discharge: 2014-04-07 | Disposition: A | Discharge status: Home / Self Care | Payer: Medicare Other | Source: Ambulatory Visit | Attending: Internal Medicine | Admitting: Internal Medicine

## 2014-04-07 DIAGNOSIS — R921 Mammographic calcification found on diagnostic imaging of breast: Secondary | ICD-10-CM

## 2014-05-12 IMAGING — MG MM DIAGNOSTIC LTD RIGHT
2 series · 2 of 2 positions shown · non-contrast
Comparison: 02/23/2013

CLINICAL DATA: Calcifications right breast identified on recent
screening mammogram/tomogram.

EXAM:
DIGITAL DIAGNOSTIC  RIGHT MAMMOGRAM

[R CC]
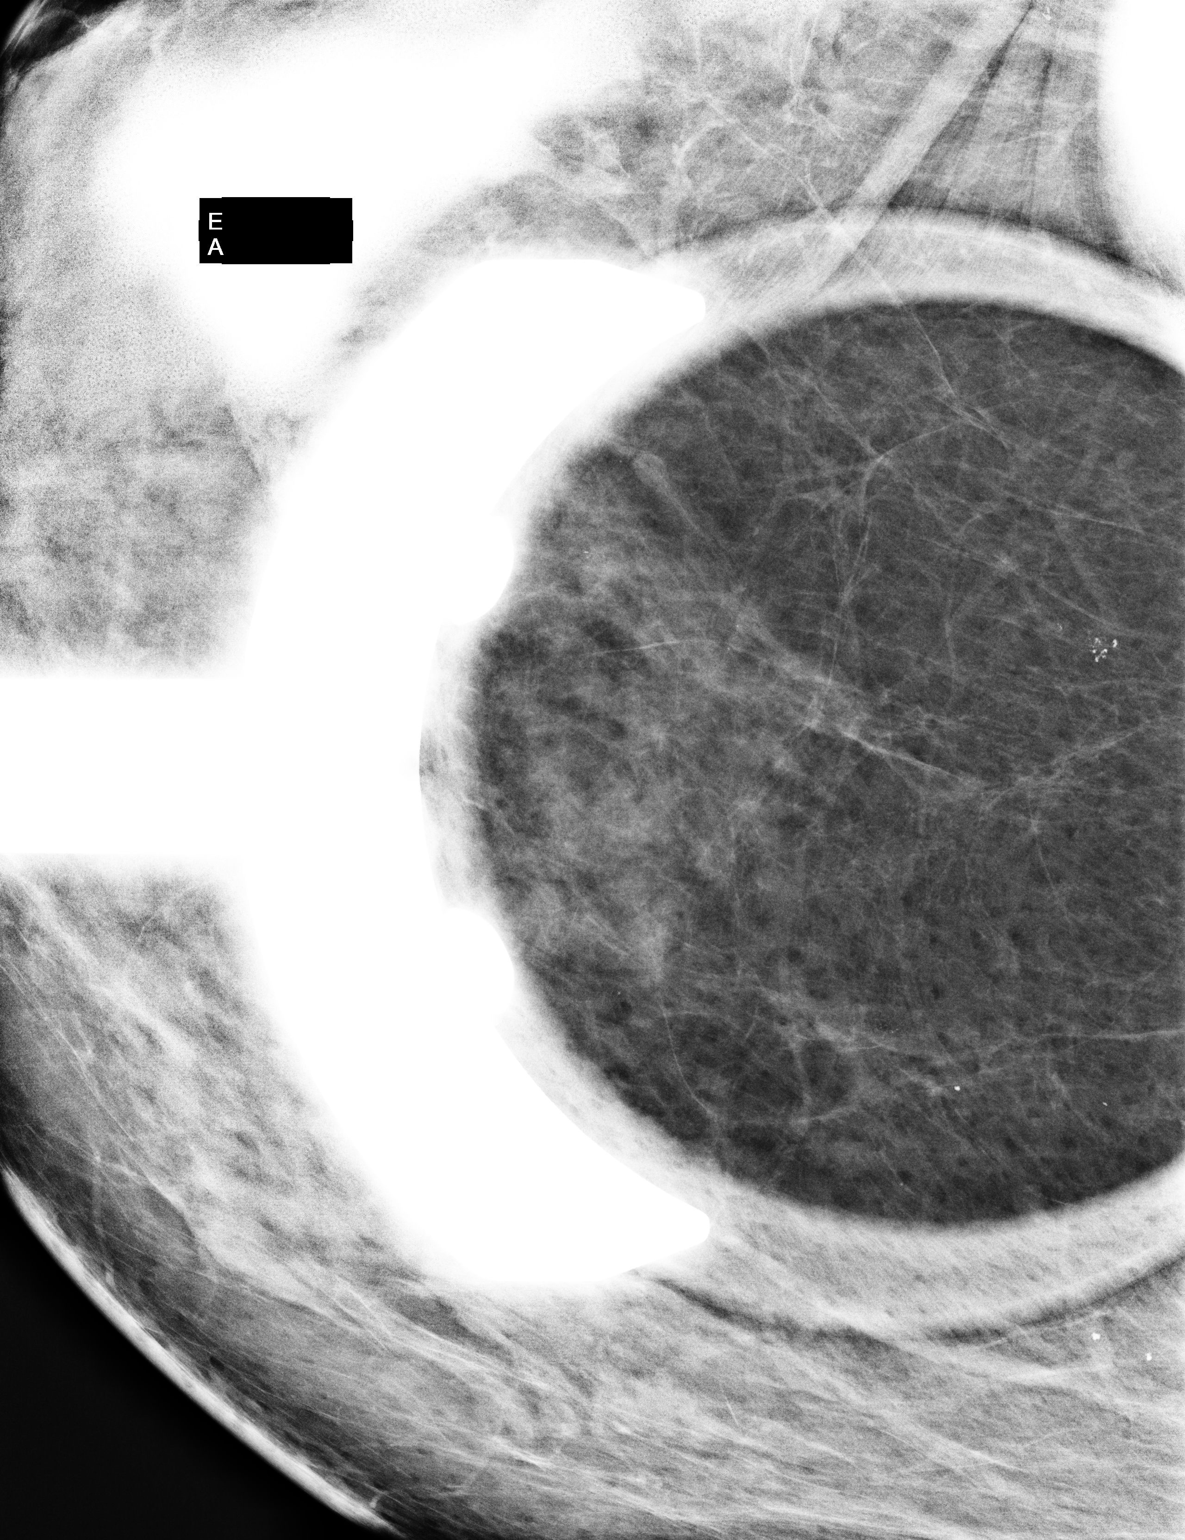

[R ML]
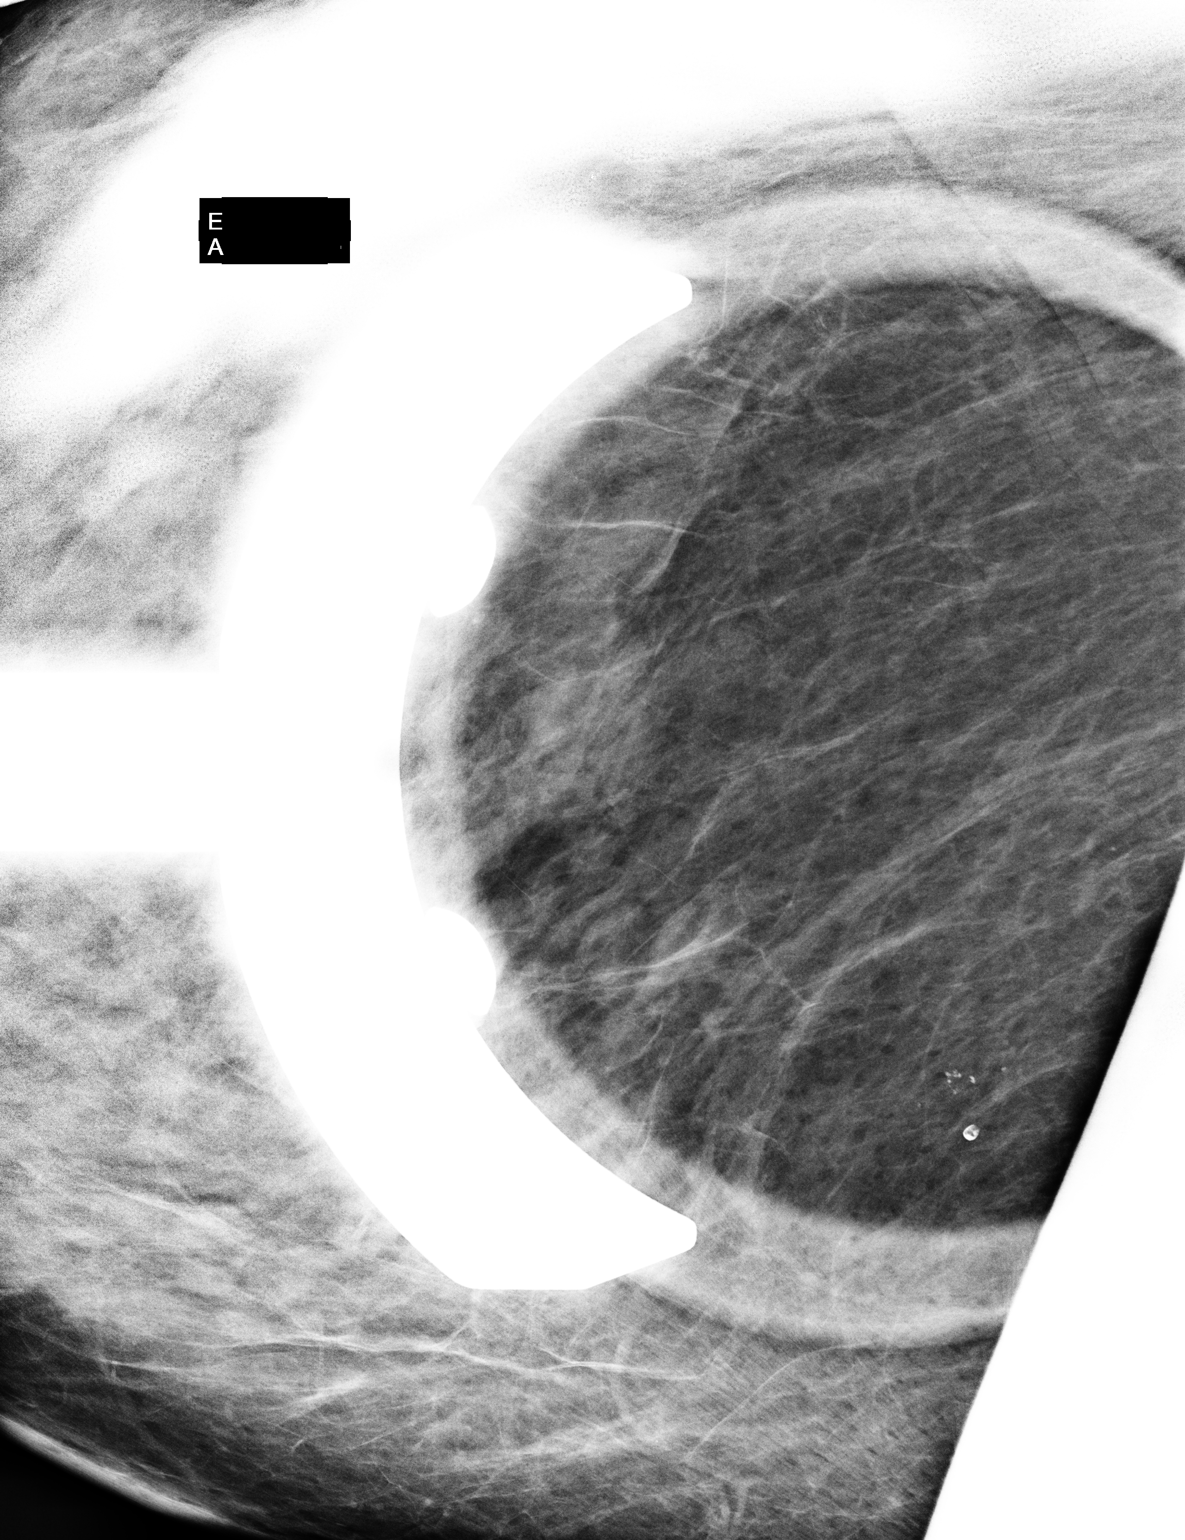

[2 of 2 positions shown; findings below may reference images not displayed]

ACR Breast Density Category c: The breast tissue is heterogeneously
dense, which may obscure small masses.
FINDINGS: Magnification views of the lower inner quadrant of the right breast
demonstrate a 3 mm cluster of coarse calcifications, several of
which have lucent centers. These calcifications are favored to be
developing within fibroadenomatoid changes/degenerating
fibroadenoma. It is noted that the patient has long-standing benign
fibroadenoma-related calcifications in the medial (opposite) breast.
There are no suspicious linear forms.
IMPRESSION: Probable benign discuss that probably benign calcifications in the
right breast.

RECOMMENDATION:
Diagnostic right mammogram in 6 months.

I have discussed the findings and recommendations with the patient.
Results were also provided in writing at the conclusion of the
visit. If applicable, a reminder letter will be sent to the patient
regarding the next appointment.

BI-RADS CATEGORY  3: Probably benign finding(s) - short interval
follow-up suggested.

## 2014-06-28 DIAGNOSIS — J309 Allergic rhinitis, unspecified: Secondary | ICD-10-CM | POA: Diagnosis not present

## 2014-07-05 DIAGNOSIS — Z01419 Encounter for gynecological examination (general) (routine) without abnormal findings: Secondary | ICD-10-CM | POA: Diagnosis not present

## 2014-07-05 DIAGNOSIS — L298 Other pruritus: Secondary | ICD-10-CM | POA: Diagnosis not present

## 2014-07-05 DIAGNOSIS — K130 Diseases of lips: Secondary | ICD-10-CM | POA: Diagnosis not present

## 2014-07-05 DIAGNOSIS — I10 Essential (primary) hypertension: Secondary | ICD-10-CM | POA: Diagnosis not present

## 2014-07-15 DIAGNOSIS — I10 Essential (primary) hypertension: Secondary | ICD-10-CM | POA: Diagnosis not present

## 2014-07-29 DIAGNOSIS — I10 Essential (primary) hypertension: Secondary | ICD-10-CM | POA: Diagnosis not present

## 2014-08-08 DIAGNOSIS — I10 Essential (primary) hypertension: Secondary | ICD-10-CM | POA: Diagnosis not present

## 2014-08-08 DIAGNOSIS — Z1389 Encounter for screening for other disorder: Secondary | ICD-10-CM | POA: Diagnosis not present

## 2014-08-08 DIAGNOSIS — Z0000 Encounter for general adult medical examination without abnormal findings: Secondary | ICD-10-CM | POA: Diagnosis not present

## 2014-08-08 DIAGNOSIS — M859 Disorder of bone density and structure, unspecified: Secondary | ICD-10-CM | POA: Diagnosis not present

## 2014-08-08 DIAGNOSIS — E785 Hyperlipidemia, unspecified: Secondary | ICD-10-CM | POA: Diagnosis not present

## 2014-08-08 DIAGNOSIS — M109 Gout, unspecified: Secondary | ICD-10-CM | POA: Diagnosis not present

## 2014-08-15 DIAGNOSIS — M8580 Other specified disorders of bone density and structure, unspecified site: Secondary | ICD-10-CM | POA: Diagnosis not present

## 2014-08-15 DIAGNOSIS — Z23 Encounter for immunization: Secondary | ICD-10-CM | POA: Diagnosis not present

## 2014-08-15 DIAGNOSIS — I10 Essential (primary) hypertension: Secondary | ICD-10-CM | POA: Diagnosis not present

## 2014-08-15 DIAGNOSIS — E785 Hyperlipidemia, unspecified: Secondary | ICD-10-CM | POA: Diagnosis not present

## 2014-08-15 DIAGNOSIS — M109 Gout, unspecified: Secondary | ICD-10-CM | POA: Diagnosis not present

## 2014-10-03 ENCOUNTER — Other Ambulatory Visit: Payer: Self-pay

## 2014-11-04 ENCOUNTER — Encounter: Payer: Self-pay | Admitting: Cardiology

## 2014-11-04 ENCOUNTER — Ambulatory Visit (INDEPENDENT_AMBULATORY_CARE_PROVIDER_SITE_OTHER): Payer: Medicare Other | Admitting: Cardiology

## 2014-11-04 VITALS — BP 126/72 | HR 70 | Ht 62.0 in | Wt 177.0 lb

## 2014-11-04 DIAGNOSIS — E785 Hyperlipidemia, unspecified: Secondary | ICD-10-CM | POA: Diagnosis not present

## 2014-11-04 DIAGNOSIS — R011 Cardiac murmur, unspecified: Secondary | ICD-10-CM | POA: Diagnosis not present

## 2014-11-04 DIAGNOSIS — I10 Essential (primary) hypertension: Secondary | ICD-10-CM | POA: Diagnosis not present

## 2014-11-04 DIAGNOSIS — E669 Obesity, unspecified: Secondary | ICD-10-CM | POA: Diagnosis not present

## 2014-11-04 NOTE — Progress Notes (Signed)
PCP: Thayer Headings, MD  Clinic Note: Chief Complaint  Patient presents with  . Follow-up    2year followup;  no chest pain, no shortness of breath, no edema, no pain in legs, no cramping in legs, no lightheadedness, no dizziness    HPI: Sonia Beck is a 69 y.o. female with a PMH below who presents today for 2 yr f/u. Her most issue has been moderate obesity and difficult to control blood pressure. She had started on a diet regimen with Dr. Ronne Binning [facility]) and has lost quite a bit of weight.   She had previously been able to stop them her blood pressure medications with her weight loss, but now is back on most of them. Restarted on Cozaar 50 mg (not currently reported) along with amlodipine and Maxzide.  Sonia Beck was last seen in 11/2012 - per our plan was for 2 yr f/u  Recent Hospitalizations: n/a  Studies Reviewed: no new studies  Interval History: Sonia Beck presents today really without any major complaints or symptoms. The bee sting she notes that she does feel that more fatigued during the more strenuous portion of her diet cycle. She has admittedly "gotten off the wagon "and gained back some weight".  She is now in the 170s. Dr. Ronne Binning recommended a goal weight of 150, which she thinks may be a little too much. She states that she just gets so hungry when she is on the initial stages of the cycle.  From a cardiac standpoint, she really denies any symptoms to suggest angina or heart failure. She occasionally has some palpitations but nothing significant enough to consider an arrhythmia.  No chest pain or shortness of breath with rest or exertion.  No PND, orthopnea or edema.  No lightheadedness, dizziness, weakness or syncope/near syncope. No TIA/amaurosis fugax symptoms.  No claudication.  Gets more tired when on MediFast diet - starts to get more fatigued & feels like she needs to eat. Had a bad month of July. PCP goal wgt is 150 & she is not sure of that. (Gained  17 lb since last visit)  Past Medical History  Diagnosis Date  . Hypertension   . Hyperlipidemia   . Gout   . Depression   . Asthma   . Cardiac murmur Echo October 2013    Aortic sclerosis, not stenosis  . Eczema   . Thrombocytopenia   . Anxiety   . Obesity (BMI 30-39.9)     Former weight 224 pounds with BMI of 41 -- 65 pound weight loss in one year    Past Surgical History  Procedure Laterality Date  . Doppler echocardiography  03/2006    ESSENTIALLY norm,andno valvular lesions despite hearing murmur  . Nm myocar perf wall motion  01/30/2001    EF 69% ,LOW RISK,mild ant wall thinning due to breast attenuation  . Doppler echocardiography  01/16/2012    ef =>55% lv norm    ROS: A comprehensive was performed. Review of Systems  Constitutional: Positive for malaise/fatigue (Plan titrating her diet). Negative for weight loss.  Gastrointestinal: Negative for constipation and blood in stool.  Genitourinary: Negative for hematuria.  Musculoskeletal: Negative for falls.  Neurological: Negative for dizziness.  Endo/Heme/Allergies: Does not bruise/bleed easily.  Psychiatric/Behavioral: Negative.   All other systems reviewed and are negative.  Prior to Admission medications   Medication Sig Start Date End Date Taking? Authorizing Provider  albuterol (PROVENTIL HFA;VENTOLIN HFA) 108 (90 BASE) MCG/ACT inhaler Inhale 2 puffs into the lungs every 6 (six)  hours as needed. For shortness of breath.   Yes Historical Provider, MD  ALPRAZolam (XANAX) 0.25 MG tablet Take 0.25 mg by mouth 2 (two) times daily as needed. For anxiety.   Yes Historical Provider, MD  amLODipine (NORVASC) 10 MG tablet Take 1 tablet by mouth daily. Take 1 tab daily 11/01/14  Yes Historical Provider, MD  calcium-vitamin D (OSCAL WITH D) 500-200 MG-UNIT per tablet Take 1 tablet by mouth daily.   Yes Historical Provider, MD  FLUoxetine (PROZAC) 20 MG capsule Take 20 mg by mouth daily.   Yes Historical Provider, MD    montelukast (SINGULAIR) 10 MG tablet Take 10 mg by mouth at bedtime.   Yes Historical Provider, MD  potassium chloride SA (K-DUR,KLOR-CON) 20 MEQ tablet Take 20 mEq by mouth daily.   Yes Historical Provider, MD  rosuvastatin (CRESTOR) 5 MG tablet Take 5 mg by mouth once a week.   Yes Historical Provider, MD  triamterene-hydrochlorothiazide (MAXZIDE-25) 37.5-25 MG per tablet Take 1 tablet by mouth daily.   Yes Historical Provider, MD  losartan (COZAAR) 50 MG tablet Take 1 tablet by mouth daily. Take 1 tab daily 08/08/14   Historical Provider, MD  ondansetron (ZOFRAN) 8 MG tablet As needed 08/15/14   Historical Provider, MD     Allergies  Allergen Reactions  . Erythromycin     Stomach cramps.  . Fosamax [Alendronate]     Joint pain.    History   Social History  . Marital Status: Single    Spouse Name: N/A  . Number of Children: N/A  . Years of Education: N/A   Occupational History  . Not on file.   Social History Main Topics  . Smoking status: Never Smoker   . Smokeless tobacco: Never Used  . Alcohol Use: No  . Drug Use: No  . Sexual Activity: Not on file   Other Topics Concern  . Not on file   Social History Narrative   She is a single with one child and adopted grandson who lives with her. She does not drink or smoke.      She is now on the maintenance phase of the MediFast diet. Exercising at least 4 times a week [place] doing cardio exercises, otherwise walking at home and the other days, and remaining active otherwise.   Family History  Problem Relation Age of Onset  . Heart disease Mother   . Heart attack Brother 55   Mother had Thoracic Aneurysm Rupture  Wt Readings from Last 3 Encounters:  11/04/14 80.287 kg (177 lb)  11/10/12 72.303 kg (159 lb 6.4 oz)    PHYSICAL EXAM BP 126/72 mmHg  Pulse 70  Ht 5\' 2"  (1.575 m)  Wt 80.287 kg (177 lb)  BMI 32.37 kg/m2 General appearance: alert, cooperative, appears stated age, no distress,  moderately obese and well-nourished and well-groomed. Very pleasant mood & effect. HEENT: [place]/AT, EOMI, MMM, anicteric sclera Neck: no adenopathy, no carotid bruit, no JVD, supple, symmetrical, trachea midline and thyroid not enlarged, symmetric, no tenderness/mass/nodules Lungs: clear to auscultation bilaterally, normal percussion bilaterally and nonlabored, good air movement Heart: regular rate and rhythm, S1, S2 normal, no S3 or S4, systolic murmur: systolic ejection 1/6, crescendo, decrescendo and Early peaking at 2nd right intercostal space, no click and no rub Abdomen: soft, non-tender; bowel sounds normal; no masses, no organomegaly and remains mildly obese but much less so Extremities: extremities normal, atraumatic, no cyanosis or edema, no edema, redness or tenderness  in the calves or thighs and no ulcers, gangrene or trophic changes Pulses: 2+ and symmetric Neurologic: Grossly normal   Adult ECG Report  Rate: 70 ;  Rhythm: normal sinus rhythm and Nonspecific ST and T wave abnormality  Narrative Interpretation: stable EKG   Other studies Reviewed: Additional studies/ records that were reviewed today include:  Recent Labs:  None available     ASSESSMENT / PLAN: Problem List Items Addressed This Visit    Cardiac murmur (Chronic)    Likely related to aortic sclerosis. I don't hear much of her murmur to suggest that this is any worse. At this point, I don't think we need to recheck an echocardiogram, however it may be prudent to consider rechecking an echocardiogram if the murmur does change.  She also is concerned about her mothers history of thoracic aortic aneurysm rupture. She does have risk factors of hypertension and dyslipidemia. I plan to discuss appropriate screening methods with vascular surgery to determine the appropriate test. This probably can be checked/ordered at the time of her followup visit which we will make one year.      Relevant Orders   EKG 12-Lead    Essential hypertension (Chronic)    Unfortunately, she had to go back on her medications. Currently well controlled we'll drug dose of amlodipine, Maxzide and Cozaar.      Relevant Medications   amLODipine (NORVASC) 10 MG tablet   rosuvastatin (CRESTOR) 5 MG tablet   losartan (COZAAR) 50 MG tablet   Other Relevant Orders   EKG 12-Lead   Hyperlipidemia - Primary (Chronic)    On low-dose Crestor. Monitored by PCP. Not sure what is happening with her labs now that she's gained back some of her weight. Hopefully as she gets back into the weight loss plan, this will stabilize.      Relevant Medications   amLODipine (NORVASC) 10 MG tablet   rosuvastatin (CRESTOR) 5 MG tablet   losartan (COZAAR) 50 MG tablet   Other Relevant Orders   EKG 12-Lead   Obesity (BMI 30-39.9) (Chronic)    He gained about 12 pounds since her last visit 2 years ago. By Dr. Zebedee Iba recommendations her, her goal weight is 150 pounds. However I think maybe 155 may be more reasonable.         Current medicines are reviewed at length with the patient today. (+/- concerns) n/a The following changes have been made: n/a  NO CHANGE IN MEDICATION   WILL CONTACT YOU AFTER DISCUSSING WITH VASCULAR SURGEON---FAMILY HX OF AAA  Your physician wants you to follow-up in 12 MONTHS DR HARDING.   Marykay Lex, M.D., M.S. Interventional Cardiologist   Pager # [phone]

## 2014-11-04 NOTE — Progress Notes (Deleted)
Patient ID: Sonia Beck, female   DOB: [birth year], 69 y.o.   MRN: [id number] PCP: Thayer Headings, MD  Clinic Note: Chief Complaint  Patient presents with  . Follow-up    2year followup;  no chest pain, no shortness of breath, no edema, no pain in legs, no cramping in legs, no lightheadedness, no dizziness   HPI: Sonia Beck is a 69 y.o. female with a PMH below who presents today for ~2-year followup. Her most notable issue had been difficult to control blood pressure, and morbid obesity.   Last visit - 11/2012  Interval History: Sonia Beck has been working with Dr. Thea Silversmith diligently over the past year with th MediFast diet plan that would exercise. This is a lifelong eating plan where she started off with a bars and certain meals and is now on the maintenance phase. She's lost 65 pounds. She is working out almost everyday of the week. 4 days a week at [place] to gradual exercise and cardiovascular status. On the other days she's walking at home. She looks remarkably well and is smiling. She is actually no complaints beside that she hopes she can keep her weight off. She is still off of losartan altogether. She is on half a dose of amlodipine now at 5 mg. She's cut her Crestor dose in half as well.  Otherwise, her cardiac review of systems is as follows: no chest pain or dyspnea on exertion negative for - edema, irregular heartbeat, loss of consciousness, murmur, orthopnea, palpitations, paroxysmal nocturnal dyspnea, rapid heart rate or shortness of breath. No lightheadedness, dizziness, wooziness, syncope/near-syncope. No TIA or amaurosis fugax in nature no melena, hematochezia nature it. No claudication.  Past Medical History  Diagnosis Date  . Hypertension   . Hyperlipidemia   . Gout   . Depression   . Asthma   . Cardiac murmur Echo October 2013    Aortic sclerosis, not stenosis  . Eczema   . Thrombocytopenia   . Anxiety   . Obesity (BMI 30-39.9)     Former weight 224 pounds with BMI of 41 -- 65 pound weight loss in one year    Prior Cardiac Evaluation and Past Surgical History: Past Surgical History  Procedure Laterality Date  . Doppler echocardiography  03/2006    ESSENTIALLY norm,andno valvular lesions despite hearing murmur  . Nm myocar perf wall motion  01/30/2001    EF 69% ,LOW RISK,mild ant wall thinning due to breast attenuation  . Doppler echocardiography  01/16/2012    ef =>55% lv norm   Allergies  Allergen Reactions  . Erythromycin     Stomach cramps.  . Fosamax [Alendronate]     Joint pain.   Current Outpatient Prescriptions  Medication Sig Dispense Refill  . albuterol (PROVENTIL HFA;VENTOLIN HFA) 108 (90 BASE) MCG/ACT inhaler Inhale 2 puffs into the lungs every 6 (six) hours as needed. For shortness of breath.    . ALPRAZolam (XANAX) 0.25 MG tablet Take 0.25 mg by mouth 2 (two) times daily as needed. For anxiety.    [name] Kitchen amLODipine (NORVASC) 10 MG tablet Take 1 tablet by mouth daily. Take 1 tab daily    . calcium-vitamin D (OSCAL WITH D) 500-200 MG-UNIT per tablet Take 1 tablet by mouth daily.    [name] Kitchen FLUoxetine (PROZAC) 20 MG capsule Take 20 mg by mouth daily.    . montelukast (SINGULAIR) 10 MG tablet Take 10 mg by mouth at bedtime.    . potassium chloride SA (K-DUR,KLOR-CON)  20 MEQ tablet Take 20 mEq by mouth daily.    . rosuvastatin (CRESTOR) 5 MG tablet Take 5 mg by mouth once a week.    . triamterene-hydrochlorothiazide (MAXZIDE-25) 37.5-25 MG per tablet Take 1 tablet by mouth daily.    [name] Kitchen losartan (COZAAR) 50 MG tablet Take 1 tablet by mouth daily. Take 1 tab daily    . ondansetron (ZOFRAN) 8 MG tablet As needed     No current facility-administered medications for this visit.    History   Social History  . Marital Status: Single    Spouse Name: N/A  . Number of Children: N/A  . Years of Education: N/A   Occupational History  . Not on file.   Social History Main Topics  . Smoking status: Never Smoker     . Smokeless tobacco: Never Used  . Alcohol Use: No  . Drug Use: No  . Sexual Activity: Not on file   Other Topics Concern  . Not on file   Social History Narrative   She is a single with one child and adopted grandson who lives with her. She does not drink or smoke.      She is now on the maintenance phase of the MediFast diet. Exercising at least 4 times a week at [place] doing cardio exercises, otherwise walking at home and the other days, and remaining active otherwise.   ROS: A comprehensive Review of Systems - Negative except Minor aches and pains if she overdoes it with exercise.  PHYSICAL EXAM BP 126/72 mmHg  Pulse 70  Ht 5\' 2"  (1.575 m)  Wt 80.287 kg (177 lb)  BMI 32.37 kg/m2 General appearance: alert, cooperative, appears stated age, no distress, moderately obese and well-nourished and well-groomed. Dramatically improved weight overall. Very pleasant mood mass effect. HEENT: [place]/AT, EOMI, MMM, anicteric sclera Neck: no adenopathy, no carotid bruit, no JVD, supple, symmetrical, trachea midline and thyroid not enlarged, symmetric, no tenderness/mass/nodules Lungs: clear to auscultation bilaterally, normal percussion bilaterally and nonlabored, good air movement Heart: regular rate and rhythm, S1, S2 normal, no S3 or S4, systolic murmur: systolic ejection 1/6, crescendo, decrescendo and Early peaking at 2nd right intercostal space, no click and no rub Abdomen: soft, non-tender; bowel sounds normal; no masses,  no organomegaly and remains mildly obese but much less so Extremities: extremities normal, atraumatic, no cyanosis or edema, no edema, redness or tenderness in the calves or thighs and no ulcers, gangrene or trophic changes Pulses: 2+ and symmetric Neurologic: Grossly normal  YQM:VHQIONGEX today: Yes Rate: 57 , Rhythm: Sinus bradycardia, nonspecific ST-T changes, otherwise normal.  Recent Labs: 10/05/2012  Total cholesterol 135, triglycerides 45,  HDL 53, LDL 73 -- outstanding  Chemistries notable for potassium of 3.8, BUN/creatinine 15/0.8,  TSH low normal 0.4 to; uric acid 6.2  CBC stable with H&H 12.1/36.8, platelets 419 and WBC 8.5.  ASSESSMENT / PLAN: Extremely stable overall. Blood pressure much better controlled as is her cholesterol with weight loss.  I congratulated with her and Dr. Thea Silversmith for how well she has done with this diet and exercise program. Sonia Beck has gone into this with full force, and has reaped  the benefits.  Weight is stable she is, I think she is fine for 2 year followup. She can come back sooner if need be. She does want to go back to sleep for piece of mind sake but is happy to you that she is doing well enough to be at the two-year  followup visits.  No problem-specific assessment & plan notes found for this encounter.  No orders of the defined types were placed in this encounter.   Meds ordered this encounter  Medication adjustments   . rosuvastatin (CRESTOR) 5 MG tablet    Sig: Take 5 mg by mouth daily.  [name] Kitchen triamterene-hydrochlorothiazide (MAXZIDE-25) 37.5-25 MG per tablet    Sig: Take 1 tablet by mouth daily.  [name] Kitchen amLODipine (NORVASC) 5 MG tablet    Sig: Take 5 mg by mouth daily.    DAVID W. Herbie Baltimore, M.D., M.S. [facility] [address] [place], [place]  [address]  [phone] Pager # [phone]

## 2014-11-04 NOTE — Patient Instructions (Addendum)
NO CHANGE IN MEDICATION   WILL CONTACT YOU AFTER DISCUSSING WITH VASCULAR SURGEON---FAMILY HX OF AAA  Your physician wants you to follow-up in 12 MONTHS DR HARDING.  You will receive a reminder letter in the mail two months in advance. If you don't receive a letter, please call our office to schedule the follow-up appointment.

## 2014-11-06 ENCOUNTER — Encounter: Payer: Self-pay | Admitting: Cardiology

## 2014-11-06 NOTE — Assessment & Plan Note (Addendum)
Likely related to aortic sclerosis. I don't hear much of her murmur to suggest that this is any worse. At this point, I don't think we need to recheck an echocardiogram, however it may be prudent to consider rechecking an echocardiogram if the murmur does change.  She also is concerned about her mothers history of thoracic aortic aneurysm rupture. She does have risk factors of hypertension and dyslipidemia. I plan to discuss appropriate screening methods with vascular surgery to determine the appropriate test. This probably can be checked/ordered at the time of her followup visit which we will make one year.

## 2014-11-06 NOTE — Assessment & Plan Note (Signed)
Unfortunately, she had to go back on her medications. Currently well controlled we'll drug dose of amlodipine, Maxzide and Cozaar.

## 2014-11-06 NOTE — Assessment & Plan Note (Signed)
On low-dose Crestor. Monitored by PCP. Not sure what is happening with her labs now that she's gained back some of her weight. Hopefully as she gets back into the weight loss plan, this will stabilize.

## 2014-11-06 NOTE — Assessment & Plan Note (Signed)
He gained about 12 pounds since her last visit 2 years ago. By Dr. Zebedee Iba recommendations her, her goal weight is 150 pounds. However I think maybe 155 may be more reasonable.

## 2015-02-09 DIAGNOSIS — Z23 Encounter for immunization: Secondary | ICD-10-CM | POA: Diagnosis not present

## 2015-03-07 DIAGNOSIS — I10 Essential (primary) hypertension: Secondary | ICD-10-CM | POA: Diagnosis not present

## 2015-03-07 DIAGNOSIS — M109 Gout, unspecified: Secondary | ICD-10-CM | POA: Diagnosis not present

## 2015-03-07 DIAGNOSIS — E785 Hyperlipidemia, unspecified: Secondary | ICD-10-CM | POA: Diagnosis not present

## 2015-03-07 DIAGNOSIS — M8580 Other specified disorders of bone density and structure, unspecified site: Secondary | ICD-10-CM | POA: Diagnosis not present

## 2015-03-07 DIAGNOSIS — E559 Vitamin D deficiency, unspecified: Secondary | ICD-10-CM | POA: Diagnosis not present

## 2015-03-14 DIAGNOSIS — M25561 Pain in right knee: Secondary | ICD-10-CM | POA: Diagnosis not present

## 2015-03-14 DIAGNOSIS — H40013 Open angle with borderline findings, low risk, bilateral: Secondary | ICD-10-CM | POA: Diagnosis not present

## 2015-03-14 DIAGNOSIS — I129 Hypertensive chronic kidney disease with stage 1 through stage 4 chronic kidney disease, or unspecified chronic kidney disease: Secondary | ICD-10-CM | POA: Diagnosis not present

## 2015-03-14 DIAGNOSIS — M179 Osteoarthritis of knee, unspecified: Secondary | ICD-10-CM | POA: Diagnosis not present

## 2015-03-14 DIAGNOSIS — H25013 Cortical age-related cataract, bilateral: Secondary | ICD-10-CM | POA: Diagnosis not present

## 2015-03-14 DIAGNOSIS — F339 Major depressive disorder, recurrent, unspecified: Secondary | ICD-10-CM | POA: Diagnosis not present

## 2015-03-14 DIAGNOSIS — N182 Chronic kidney disease, stage 2 (mild): Secondary | ICD-10-CM | POA: Diagnosis not present

## 2015-03-14 DIAGNOSIS — D696 Thrombocytopenia, unspecified: Secondary | ICD-10-CM | POA: Diagnosis not present

## 2015-03-14 DIAGNOSIS — H2513 Age-related nuclear cataract, bilateral: Secondary | ICD-10-CM | POA: Diagnosis not present

## 2015-03-23 DIAGNOSIS — J45909 Unspecified asthma, uncomplicated: Secondary | ICD-10-CM | POA: Diagnosis not present

## 2015-03-23 DIAGNOSIS — M1990 Unspecified osteoarthritis, unspecified site: Secondary | ICD-10-CM | POA: Diagnosis not present

## 2015-03-23 DIAGNOSIS — M25569 Pain in unspecified knee: Secondary | ICD-10-CM | POA: Diagnosis not present

## 2015-05-31 DIAGNOSIS — M25561 Pain in right knee: Secondary | ICD-10-CM | POA: Diagnosis not present

## 2015-05-31 DIAGNOSIS — R262 Difficulty in walking, not elsewhere classified: Secondary | ICD-10-CM | POA: Diagnosis not present

## 2015-05-31 DIAGNOSIS — M6281 Muscle weakness (generalized): Secondary | ICD-10-CM | POA: Diagnosis not present

## 2015-06-06 DIAGNOSIS — M25561 Pain in right knee: Secondary | ICD-10-CM | POA: Diagnosis not present

## 2015-06-06 DIAGNOSIS — M6281 Muscle weakness (generalized): Secondary | ICD-10-CM | POA: Diagnosis not present

## 2015-06-06 DIAGNOSIS — R262 Difficulty in walking, not elsewhere classified: Secondary | ICD-10-CM | POA: Diagnosis not present

## 2015-06-08 DIAGNOSIS — M6281 Muscle weakness (generalized): Secondary | ICD-10-CM | POA: Diagnosis not present

## 2015-06-08 DIAGNOSIS — M25561 Pain in right knee: Secondary | ICD-10-CM | POA: Diagnosis not present

## 2015-06-08 DIAGNOSIS — R262 Difficulty in walking, not elsewhere classified: Secondary | ICD-10-CM | POA: Diagnosis not present

## 2015-06-12 DIAGNOSIS — R262 Difficulty in walking, not elsewhere classified: Secondary | ICD-10-CM | POA: Diagnosis not present

## 2015-06-12 DIAGNOSIS — M25561 Pain in right knee: Secondary | ICD-10-CM | POA: Diagnosis not present

## 2015-06-12 DIAGNOSIS — M6281 Muscle weakness (generalized): Secondary | ICD-10-CM | POA: Diagnosis not present

## 2015-06-15 DIAGNOSIS — M6281 Muscle weakness (generalized): Secondary | ICD-10-CM | POA: Diagnosis not present

## 2015-06-15 DIAGNOSIS — R262 Difficulty in walking, not elsewhere classified: Secondary | ICD-10-CM | POA: Diagnosis not present

## 2015-06-15 DIAGNOSIS — M25561 Pain in right knee: Secondary | ICD-10-CM | POA: Diagnosis not present

## 2015-06-19 DIAGNOSIS — R262 Difficulty in walking, not elsewhere classified: Secondary | ICD-10-CM | POA: Diagnosis not present

## 2015-06-19 DIAGNOSIS — M25561 Pain in right knee: Secondary | ICD-10-CM | POA: Diagnosis not present

## 2015-06-19 DIAGNOSIS — M6281 Muscle weakness (generalized): Secondary | ICD-10-CM | POA: Diagnosis not present

## 2015-06-20 DIAGNOSIS — H40013 Open angle with borderline findings, low risk, bilateral: Secondary | ICD-10-CM | POA: Diagnosis not present

## 2015-06-26 DIAGNOSIS — M6281 Muscle weakness (generalized): Secondary | ICD-10-CM | POA: Diagnosis not present

## 2015-06-26 DIAGNOSIS — R262 Difficulty in walking, not elsewhere classified: Secondary | ICD-10-CM | POA: Diagnosis not present

## 2015-06-26 DIAGNOSIS — M25561 Pain in right knee: Secondary | ICD-10-CM | POA: Diagnosis not present

## 2015-06-28 DIAGNOSIS — M6281 Muscle weakness (generalized): Secondary | ICD-10-CM | POA: Diagnosis not present

## 2015-06-28 DIAGNOSIS — M25561 Pain in right knee: Secondary | ICD-10-CM | POA: Diagnosis not present

## 2015-06-28 DIAGNOSIS — R262 Difficulty in walking, not elsewhere classified: Secondary | ICD-10-CM | POA: Diagnosis not present

## 2015-07-03 DIAGNOSIS — M25561 Pain in right knee: Secondary | ICD-10-CM | POA: Diagnosis not present

## 2015-07-03 DIAGNOSIS — M6281 Muscle weakness (generalized): Secondary | ICD-10-CM | POA: Diagnosis not present

## 2015-07-03 DIAGNOSIS — R262 Difficulty in walking, not elsewhere classified: Secondary | ICD-10-CM | POA: Diagnosis not present

## 2015-07-05 DIAGNOSIS — M25561 Pain in right knee: Secondary | ICD-10-CM | POA: Diagnosis not present

## 2015-07-05 DIAGNOSIS — M6281 Muscle weakness (generalized): Secondary | ICD-10-CM | POA: Diagnosis not present

## 2015-07-05 DIAGNOSIS — R262 Difficulty in walking, not elsewhere classified: Secondary | ICD-10-CM | POA: Diagnosis not present

## 2015-07-10 ENCOUNTER — Other Ambulatory Visit: Payer: Self-pay | Admitting: Internal Medicine

## 2015-07-10 DIAGNOSIS — R921 Mammographic calcification found on diagnostic imaging of breast: Secondary | ICD-10-CM

## 2015-07-11 DIAGNOSIS — J309 Allergic rhinitis, unspecified: Secondary | ICD-10-CM | POA: Diagnosis not present

## 2015-07-12 DIAGNOSIS — M25561 Pain in right knee: Secondary | ICD-10-CM | POA: Diagnosis not present

## 2015-07-12 DIAGNOSIS — M6281 Muscle weakness (generalized): Secondary | ICD-10-CM | POA: Diagnosis not present

## 2015-07-12 DIAGNOSIS — R262 Difficulty in walking, not elsewhere classified: Secondary | ICD-10-CM | POA: Diagnosis not present

## 2015-07-17 DIAGNOSIS — R262 Difficulty in walking, not elsewhere classified: Secondary | ICD-10-CM | POA: Diagnosis not present

## 2015-07-17 DIAGNOSIS — M25561 Pain in right knee: Secondary | ICD-10-CM | POA: Diagnosis not present

## 2015-07-17 DIAGNOSIS — M6281 Muscle weakness (generalized): Secondary | ICD-10-CM | POA: Diagnosis not present

## 2015-07-18 ENCOUNTER — Ambulatory Visit
Admission: RE | Admit: 2015-07-18 | Discharge: 2015-07-18 | Disposition: A | Discharge status: Home / Self Care | Payer: Medicare Other | Source: Ambulatory Visit | Attending: Internal Medicine | Admitting: Internal Medicine

## 2015-07-18 DIAGNOSIS — R921 Mammographic calcification found on diagnostic imaging of breast: Secondary | ICD-10-CM | POA: Diagnosis not present

## 2015-07-19 DIAGNOSIS — M25561 Pain in right knee: Secondary | ICD-10-CM | POA: Diagnosis not present

## 2015-07-19 DIAGNOSIS — R262 Difficulty in walking, not elsewhere classified: Secondary | ICD-10-CM | POA: Diagnosis not present

## 2015-07-19 DIAGNOSIS — M6281 Muscle weakness (generalized): Secondary | ICD-10-CM | POA: Diagnosis not present

## 2015-07-26 DIAGNOSIS — M25561 Pain in right knee: Secondary | ICD-10-CM | POA: Diagnosis not present

## 2015-07-26 DIAGNOSIS — R262 Difficulty in walking, not elsewhere classified: Secondary | ICD-10-CM | POA: Diagnosis not present

## 2015-07-26 DIAGNOSIS — M6281 Muscle weakness (generalized): Secondary | ICD-10-CM | POA: Diagnosis not present

## 2015-07-31 DIAGNOSIS — R262 Difficulty in walking, not elsewhere classified: Secondary | ICD-10-CM | POA: Diagnosis not present

## 2015-07-31 DIAGNOSIS — M6281 Muscle weakness (generalized): Secondary | ICD-10-CM | POA: Diagnosis not present

## 2015-07-31 DIAGNOSIS — M25561 Pain in right knee: Secondary | ICD-10-CM | POA: Diagnosis not present

## 2015-08-02 DIAGNOSIS — M25561 Pain in right knee: Secondary | ICD-10-CM | POA: Diagnosis not present

## 2015-08-02 DIAGNOSIS — M6281 Muscle weakness (generalized): Secondary | ICD-10-CM | POA: Diagnosis not present

## 2015-08-02 DIAGNOSIS — R262 Difficulty in walking, not elsewhere classified: Secondary | ICD-10-CM | POA: Diagnosis not present

## 2015-08-07 DIAGNOSIS — M6281 Muscle weakness (generalized): Secondary | ICD-10-CM | POA: Diagnosis not present

## 2015-08-07 DIAGNOSIS — R262 Difficulty in walking, not elsewhere classified: Secondary | ICD-10-CM | POA: Diagnosis not present

## 2015-08-07 DIAGNOSIS — M25561 Pain in right knee: Secondary | ICD-10-CM | POA: Diagnosis not present

## 2015-08-09 DIAGNOSIS — R262 Difficulty in walking, not elsewhere classified: Secondary | ICD-10-CM | POA: Diagnosis not present

## 2015-08-09 DIAGNOSIS — M6281 Muscle weakness (generalized): Secondary | ICD-10-CM | POA: Diagnosis not present

## 2015-08-09 DIAGNOSIS — M25561 Pain in right knee: Secondary | ICD-10-CM | POA: Diagnosis not present

## 2015-08-14 DIAGNOSIS — R262 Difficulty in walking, not elsewhere classified: Secondary | ICD-10-CM | POA: Diagnosis not present

## 2015-08-14 DIAGNOSIS — M25561 Pain in right knee: Secondary | ICD-10-CM | POA: Diagnosis not present

## 2015-08-14 DIAGNOSIS — M6281 Muscle weakness (generalized): Secondary | ICD-10-CM | POA: Diagnosis not present

## 2015-08-16 DIAGNOSIS — R262 Difficulty in walking, not elsewhere classified: Secondary | ICD-10-CM | POA: Diagnosis not present

## 2015-08-16 DIAGNOSIS — M25561 Pain in right knee: Secondary | ICD-10-CM | POA: Diagnosis not present

## 2015-08-16 DIAGNOSIS — M6281 Muscle weakness (generalized): Secondary | ICD-10-CM | POA: Diagnosis not present

## 2015-08-24 DIAGNOSIS — R05 Cough: Secondary | ICD-10-CM | POA: Diagnosis not present

## 2015-08-28 DIAGNOSIS — M6281 Muscle weakness (generalized): Secondary | ICD-10-CM | POA: Diagnosis not present

## 2015-08-28 DIAGNOSIS — R262 Difficulty in walking, not elsewhere classified: Secondary | ICD-10-CM | POA: Diagnosis not present

## 2015-08-28 DIAGNOSIS — M25561 Pain in right knee: Secondary | ICD-10-CM | POA: Diagnosis not present

## 2015-09-07 DIAGNOSIS — M6281 Muscle weakness (generalized): Secondary | ICD-10-CM | POA: Diagnosis not present

## 2015-09-07 DIAGNOSIS — M25561 Pain in right knee: Secondary | ICD-10-CM | POA: Diagnosis not present

## 2015-09-07 DIAGNOSIS — R262 Difficulty in walking, not elsewhere classified: Secondary | ICD-10-CM | POA: Diagnosis not present

## 2015-09-11 DIAGNOSIS — R262 Difficulty in walking, not elsewhere classified: Secondary | ICD-10-CM | POA: Diagnosis not present

## 2015-09-11 DIAGNOSIS — M25561 Pain in right knee: Secondary | ICD-10-CM | POA: Diagnosis not present

## 2015-09-11 DIAGNOSIS — M6281 Muscle weakness (generalized): Secondary | ICD-10-CM | POA: Diagnosis not present

## 2015-09-12 DIAGNOSIS — M6281 Muscle weakness (generalized): Secondary | ICD-10-CM | POA: Diagnosis not present

## 2015-09-12 DIAGNOSIS — M25561 Pain in right knee: Secondary | ICD-10-CM | POA: Diagnosis not present

## 2015-09-12 DIAGNOSIS — R262 Difficulty in walking, not elsewhere classified: Secondary | ICD-10-CM | POA: Diagnosis not present

## 2015-09-20 DIAGNOSIS — M25561 Pain in right knee: Secondary | ICD-10-CM | POA: Diagnosis not present

## 2015-09-20 DIAGNOSIS — R262 Difficulty in walking, not elsewhere classified: Secondary | ICD-10-CM | POA: Diagnosis not present

## 2015-09-20 DIAGNOSIS — M6281 Muscle weakness (generalized): Secondary | ICD-10-CM | POA: Diagnosis not present

## 2015-09-26 DIAGNOSIS — M109 Gout, unspecified: Secondary | ICD-10-CM | POA: Diagnosis not present

## 2015-09-26 DIAGNOSIS — Z1389 Encounter for screening for other disorder: Secondary | ICD-10-CM | POA: Diagnosis not present

## 2015-09-26 DIAGNOSIS — M859 Disorder of bone density and structure, unspecified: Secondary | ICD-10-CM | POA: Diagnosis not present

## 2015-09-26 DIAGNOSIS — M8580 Other specified disorders of bone density and structure, unspecified site: Secondary | ICD-10-CM | POA: Diagnosis not present

## 2015-09-26 DIAGNOSIS — E785 Hyperlipidemia, unspecified: Secondary | ICD-10-CM | POA: Diagnosis not present

## 2015-09-26 DIAGNOSIS — I10 Essential (primary) hypertension: Secondary | ICD-10-CM | POA: Diagnosis not present

## 2015-09-26 DIAGNOSIS — E559 Vitamin D deficiency, unspecified: Secondary | ICD-10-CM | POA: Diagnosis not present

## 2015-09-26 DIAGNOSIS — I129 Hypertensive chronic kidney disease with stage 1 through stage 4 chronic kidney disease, or unspecified chronic kidney disease: Secondary | ICD-10-CM | POA: Diagnosis not present

## 2015-09-26 DIAGNOSIS — Z0000 Encounter for general adult medical examination without abnormal findings: Secondary | ICD-10-CM | POA: Diagnosis not present

## 2015-10-03 DIAGNOSIS — F339 Major depressive disorder, recurrent, unspecified: Secondary | ICD-10-CM | POA: Diagnosis not present

## 2015-10-03 DIAGNOSIS — I129 Hypertensive chronic kidney disease with stage 1 through stage 4 chronic kidney disease, or unspecified chronic kidney disease: Secondary | ICD-10-CM | POA: Diagnosis not present

## 2015-10-03 DIAGNOSIS — E785 Hyperlipidemia, unspecified: Secondary | ICD-10-CM | POA: Diagnosis not present

## 2015-10-05 DIAGNOSIS — R262 Difficulty in walking, not elsewhere classified: Secondary | ICD-10-CM | POA: Diagnosis not present

## 2015-10-05 DIAGNOSIS — M6281 Muscle weakness (generalized): Secondary | ICD-10-CM | POA: Diagnosis not present

## 2015-10-05 DIAGNOSIS — M25561 Pain in right knee: Secondary | ICD-10-CM | POA: Diagnosis not present

## 2015-12-01 ENCOUNTER — Other Ambulatory Visit: Payer: Self-pay

## 2016-01-14 ENCOUNTER — Emergency Department (HOSPITAL_COMMUNITY): Payer: Medicare Other

## 2016-01-14 ENCOUNTER — Encounter (HOSPITAL_COMMUNITY): Payer: Self-pay

## 2016-01-14 ENCOUNTER — Emergency Department (HOSPITAL_COMMUNITY)
Admission: EM | Admit: 2016-01-14 | Discharge: 2016-01-14 | Disposition: A | Discharge status: Home / Self Care | Payer: Medicare Other | Attending: Emergency Medicine | Admitting: Emergency Medicine

## 2016-01-14 DIAGNOSIS — R112 Nausea with vomiting, unspecified: Secondary | ICD-10-CM | POA: Diagnosis not present

## 2016-01-14 DIAGNOSIS — R42 Dizziness and giddiness: Secondary | ICD-10-CM | POA: Diagnosis not present

## 2016-01-14 DIAGNOSIS — I10 Essential (primary) hypertension: Secondary | ICD-10-CM | POA: Diagnosis not present

## 2016-01-14 DIAGNOSIS — J45909 Unspecified asthma, uncomplicated: Secondary | ICD-10-CM | POA: Insufficient documentation

## 2016-01-14 DIAGNOSIS — R404 Transient alteration of awareness: Secondary | ICD-10-CM | POA: Diagnosis not present

## 2016-01-14 DIAGNOSIS — E876 Hypokalemia: Secondary | ICD-10-CM

## 2016-01-14 DIAGNOSIS — R109 Unspecified abdominal pain: Secondary | ICD-10-CM | POA: Diagnosis not present

## 2016-01-14 LAB — CBC WITH DIFFERENTIAL/PLATELET
Basophils Absolute: 0 10*3/uL (ref 0.0–0.1)
Basophils Relative: 0 %
Eosinophils Absolute: 0 10*3/uL (ref 0.0–0.7)
Eosinophils Relative: 0 %
HCT: 37.4 % (ref 36.0–46.0)
Hemoglobin: 12.7 g/dL (ref 12.0–15.0)
Lymphocytes Relative: 16 %
Lymphs Abs: 1.9 10*3/uL (ref 0.7–4.0)
MCH: 26.2 pg (ref 26.0–34.0)
MCHC: 34 g/dL (ref 30.0–36.0)
MCV: 77.1 fL — ABNORMAL LOW (ref 78.0–100.0)
Monocytes Absolute: 0.6 10*3/uL (ref 0.1–1.0)
Monocytes Relative: 5 %
Neutro Abs: 8.9 10*3/uL — ABNORMAL HIGH (ref 1.7–7.7)
Neutrophils Relative %: 79 %
Platelets: 446 10*3/uL — ABNORMAL HIGH (ref 150–400)
RBC: 4.85 MIL/uL (ref 3.87–5.11)
RDW: 15.6 % — ABNORMAL HIGH (ref 11.5–15.5)
WBC: 11.4 10*3/uL — ABNORMAL HIGH (ref 4.0–10.5)

## 2016-01-14 LAB — URINALYSIS, ROUTINE W REFLEX MICROSCOPIC
Bilirubin Urine: NEGATIVE
Glucose, UA: NEGATIVE mg/dL
Hgb urine dipstick: NEGATIVE
Ketones, ur: NEGATIVE mg/dL
Leukocytes, UA: NEGATIVE
Nitrite: NEGATIVE
Protein, ur: NEGATIVE mg/dL
Specific Gravity, Urine: 1.01 (ref 1.005–1.030)
pH: 8 (ref 5.0–8.0)

## 2016-01-14 LAB — COMPREHENSIVE METABOLIC PANEL
ALT: 14 U/L (ref 14–54)
AST: 24 U/L (ref 15–41)
Albumin: 4.1 g/dL (ref 3.5–5.0)
Alkaline Phosphatase: 92 U/L (ref 38–126)
Anion gap: 12 (ref 5–15)
BUN: 11 mg/dL (ref 6–20)
CO2: 26 mmol/L (ref 22–32)
Calcium: 9.8 mg/dL (ref 8.9–10.3)
Chloride: 98 mmol/L — ABNORMAL LOW (ref 101–111)
Creatinine, Ser: 0.71 mg/dL (ref 0.44–1.00)
GFR calc Af Amer: >60 mL/min (ref >60)
GFR calc non Af Amer: >60 mL/min (ref >60)
Glucose, Bld: 123 mg/dL — ABNORMAL HIGH (ref 65–99)
Potassium: 2.8 mmol/L — ABNORMAL LOW (ref 3.5–5.1)
Sodium: 136 mmol/L (ref 135–145)
Total Bilirubin: 0.5 mg/dL (ref 0.3–1.2)
Total Protein: 9.2 g/dL — ABNORMAL HIGH (ref 6.5–8.1)

## 2016-01-14 LAB — TROPONIN I: Troponin I: <0.03 ng/mL (ref <0.03)

## 2016-01-14 LAB — LIPASE, BLOOD: Lipase: 27 U/L (ref 11–51)

## 2016-01-14 MED ORDER — ONDANSETRON HCL 4 MG/2ML IJ SOLN
4.0000 mg | Freq: Once | INTRAMUSCULAR | Status: AC
  Administered 2016-01-14: 4 mg via INTRAVENOUS
  Filled 2016-01-14: qty 2

## 2016-01-14 MED ORDER — POTASSIUM CHLORIDE CRYS ER 20 MEQ PO TBCR
40.0000 meq | EXTENDED_RELEASE_TABLET | Freq: Once | ORAL | Status: AC
  Administered 2016-01-14: 40 meq via ORAL
  Filled 2016-01-14: qty 2

## 2016-01-14 MED ORDER — SIMETHICONE 40 MG/0.6ML PO SUSP (UNIT DOSE)
40.0000 mg | Freq: Once | ORAL | Status: AC
  Administered 2016-01-14: 40 mg via ORAL
  Filled 2016-01-14: qty 0.6

## 2016-01-14 MED ORDER — POTASSIUM CHLORIDE 10 MEQ/100ML IV SOLN
10.0000 meq | Freq: Once | INTRAVENOUS | Status: AC
  Administered 2016-01-14: 10 meq via INTRAVENOUS
  Filled 2016-01-14: qty 100

## 2016-01-14 NOTE — ED Triage Notes (Signed)
She states she felt badly while at church today; felt a bit lightheaded and then vomited. She became diaphoretic at the time she vomited, which she states happens whenever she vomits. She had also c/o some right-sided abd./flank area discomfort, which has abated somewhat. She ambulates to our b.r. And back upon arrival to E.D. Without issue.

## 2016-01-14 NOTE — ED Notes (Signed)
Pts O2 was 93% while ambulating

## 2016-01-14 NOTE — Discharge Instructions (Signed)
You have been evaluated for your symptoms.  No evidence of heart complications on our exam.  Your potassium level is low, please take your supplementation and follow up with your doctor next week for recheck.  Return to the ER if your condition worsen or if you have other concerns.

## 2016-01-14 NOTE — ED Provider Notes (Signed)
[facility]-EMERGENCY DEPT Provider Note   CSN: [id number] Arrival date & time: 01/14/16  1226     History   Chief Complaint Chief Complaint  Patient presents with  . Emesis    HPI Sonia Beck is a 70 y.o. female.  HPI   70 year old female with history of asthma, hypertension, thrombocytopenia presenting with concerns of nausea. Patient reports morning she developed some "gas pain" in her epigastric region. Pain eventually resides to the right upper quadrant. She went to church when this pain developed. She states she has pain like this in the past which usually resolves with taking Tylenol. She was asking around the Tylenol but no one has it. Although the pain subsided patient became nauseous and diaphoretic.  She had a bowel movement with some loose stools and gas pain seems to improve. While sitting on the bench with the nausea, church members encourage patient to come to ER for further evaluation. Patient was brought here via EMS. Aside from mild nausea at this time, she has no other symptoms. Pt denies fever, chills, headache, chest pain, short of breath, productive cough, hemoptysis, back pain, dysuria, numbness or weakness. She denies any prior history of cardiac disease. She is a nonsmoker.   Past Medical History:  Diagnosis Date  . Anxiety   . Asthma   . Cardiac murmur Echo October 2013   Aortic sclerosis, not stenosis  . Depression   . Eczema   . Gout   . Hyperlipidemia   . Hypertension   . Obesity (BMI 30-39.9)    Former weight 224 pounds with BMI of 41 -- 65 pound weight loss in one year  . Thrombocytopenia [facility](HCC)     Patient Active Problem List   Diagnosis Date Noted  . Obesity (BMI 30-39.9) 11/13/2012  . Cardiac murmur   . Essential hypertension   . Hyperlipidemia     Past Surgical History:  Procedure Laterality Date  . DOPPLER ECHOCARDIOGRAPHY  03/2006   ESSENTIALLY norm,andno valvular lesions despite hearing murmur  . DOPPLER ECHOCARDIOGRAPHY  01/16/2012    ef =>55% lv norm  . NM MYOCAR PERF WALL MOTION  01/30/2001   EF 69% ,LOW RISK,mild ant wall thinning due to breast attenuation    OB History    No data available       Home Medications    Prior to Admission medications   Medication Sig Start Date End Date Taking? Authorizing Provider  albuterol (PROVENTIL HFA;VENTOLIN HFA) 108 (90 BASE) MCG/ACT inhaler Inhale 2 puffs into the lungs every 6 (six) hours as needed. For shortness of breath.    Historical Provider, MD  ALPRAZolam Prudy Feeler(XANAX) 0.25 MG tablet Take 0.25 mg by mouth 2 (two) times daily as needed. For anxiety.    Historical Provider, MD  amLODipine (NORVASC) 10 MG tablet Take 1 tablet by mouth daily. Take 1 tab daily 11/01/14   Historical Provider, MD  calcium-vitamin D (OSCAL WITH D) 500-200 MG-UNIT per tablet Take 1 tablet by mouth daily.    Historical Provider, MD  FLUoxetine (PROZAC) 20 MG capsule Take 20 mg by mouth daily.    Historical Provider, MD  losartan (COZAAR) 50 MG tablet Take 1 tablet by mouth daily. Take 1 tab daily 08/08/14   Historical Provider, MD  montelukast (SINGULAIR) 10 MG tablet Take 10 mg by mouth at bedtime.    Historical Provider, MD  ondansetron (ZOFRAN) 8 MG tablet As needed 08/15/14   Historical Provider, MD  potassium chloride SA (K-DUR,KLOR-CON) 20 MEQ tablet Take  20 mEq by mouth daily.    Historical Provider, MD  rosuvastatin (CRESTOR) 5 MG tablet Take 5 mg by mouth once a week.    Historical Provider, MD  triamterene-hydrochlorothiazide (MAXZIDE-25) 37.5-25 MG per tablet Take 1 tablet by mouth daily.    Historical Provider, MD    Family History Family History  Problem Relation Age of Onset  . Heart disease Mother   . Heart attack Brother 58    Social History Social History  Substance Use Topics  . Smoking status: Never Smoker  . Smokeless tobacco: Never Used  . Alcohol use No     Allergies   Erythromycin and Fosamax [alendronate]   Review of Systems Review of Systems  All other  systems reviewed and are negative.    Physical Exam Updated Vital Signs BP 182/72   Pulse 92   Temp 97.9 F (36.6 C) (Oral)   Resp 17   SpO2 98%   Physical Exam  Constitutional: She appears well-developed and well-nourished. No distress.  HENT:  Head: Atraumatic.  Eyes: Conjunctivae are normal.  Neck: Neck supple.  Cardiovascular: Normal rate and regular rhythm.   Murmur heard. Pulmonary/Chest: Effort normal and breath sounds normal.  Abdominal: Soft. Bowel sounds are normal. She exhibits no distension. There is no tenderness.  Negative Murphy sign.  Neurological: She is alert.  Skin: No rash noted.  Psychiatric: She has a normal mood and affect.  Nursing note and vitals reviewed.    ED Treatments / Results  Labs (all labs ordered are listed, but only abnormal results are displayed) Labs Reviewed  COMPREHENSIVE METABOLIC PANEL - Abnormal; Notable for the following:       Result Value   Potassium 2.8 (*)    Chloride 98 (*)    Glucose, Bld 123 (*)    Total Protein 9.2 (*)    All other components within normal limits  CBC WITH DIFFERENTIAL/PLATELET - Abnormal; Notable for the following:    WBC 11.4 (*)    MCV 77.1 (*)    RDW 15.6 (*)    Platelets 446 (*)    Neutro Abs 8.9 (*)    All other components within normal limits  URINALYSIS, ROUTINE W REFLEX MICROSCOPIC (NOT AT [facility])  LIPASE, BLOOD  TROPONIN I    EKG  EKG Interpretation None     ED ECG REPORT   Date: 01/14/2016  Rate: 68  Rhythm: normal sinus rhythm  QRS Axis: normal  Intervals: QT prolonged  ST/T Wave abnormalities: nonspecific T wave changes  Conduction Disutrbances:none  Narrative Interpretation:   Old EKG Reviewed: unchanged  I have personally reviewed the EKG tracing and agree with the computerized printout as noted.   Radiology Dg Chest 2 View  Result Date: 01/14/2016 CLINICAL DATA:  Lightheadedness, nausea and right-sided flank pain. EXAM: CHEST  2 VIEW COMPARISON:  08/24/2015  FINDINGS: The cardiac silhouette is mildly enlarged, stable. Mediastinal contours appear intact. Main pulmonary arterial shadows are prominent. There is no evidence of focal airspace consolidation, pleural effusion or pneumothorax. Mild prominence of the interstitium. Osseous structures are without acute abnormality. Soft tissues are grossly normal. IMPRESSION: Probable mild chronic pulmonary arterial hypertension. Stable mild cardiomegaly. Electronically Signed   By: Ted Mcalpine M.D.   On: 01/14/2016 14:09    Procedures Procedures (including critical care time)  Medications Ordered in ED Medications  potassium chloride 10 mEq in 100 mL IVPB (10 mEq Intravenous New Bag/Given 01/14/16 1512)  ondansetron (ZOFRAN) injection 4 mg (4 mg Intravenous Given  01/14/16 1325)  potassium chloride SA (K-DUR,KLOR-CON) CR tablet 40 mEq (40 mEq Oral Given 01/14/16 1513)     Initial Impression / Assessment and Plan / ED Course  I have reviewed the triage vital signs and the nursing notes.  Pertinent labs & imaging results that were available during my care of the patient were reviewed by me and considered in my medical decision making (see chart for details).  Clinical Course    BP 154/68   Pulse 90   Temp 97.9 F (36.6 C) (Oral)   Resp 16   SpO2 100%    Final Clinical Impressions(s) / ED Diagnoses   Final diagnoses:  Nausea and vomiting, intractability of vomiting not specified, unspecified vomiting type  Hypokalemia    New Prescriptions New Prescriptions   No medications on file   1:21 PM Pt here with c/o nausea.  Initially had upper abd pain but no active abd pain currently.  abd soft/nontender on exam. Given age, will perform cardiac work up.  Care discussed with Dr. Estell Harpin.  3:27 PM EKG without acute ischemic changes.  Prolonged QTc 546.  Labs remarkable for K+ 2.8.    Pt sts after having a BM she feels much better.   3:39 PM Pt resting comfortably, She report she has hx of  hypokalemia likely 2/2 diuretic use.  She takes K+ supplementation daily, did not take any today.  Supplementation given in ER.  Encourage pt to f/u for recheck next week.  Pt stable for discharge.     Fayrene Helper, PA-C 01/14/16 1551    Fayrene Helper, PA-C 01/14/16 1552    Bethann Berkshire, MD 01/15/16 [phone]

## 2016-01-14 NOTE — ED Notes (Signed)
Bed: [id number] Expected date: 01/14/16 Expected time: 12:30 PM Means of arrival: Ambulance Comments: Dizziness and nausea at [employer]

## 2016-01-18 DIAGNOSIS — R110 Nausea: Secondary | ICD-10-CM | POA: Diagnosis not present

## 2016-02-07 DIAGNOSIS — Z23 Encounter for immunization: Secondary | ICD-10-CM | POA: Diagnosis not present

## 2016-03-13 DIAGNOSIS — M109 Gout, unspecified: Secondary | ICD-10-CM | POA: Diagnosis not present

## 2016-03-13 DIAGNOSIS — I10 Essential (primary) hypertension: Secondary | ICD-10-CM | POA: Diagnosis not present

## 2016-03-13 DIAGNOSIS — M8580 Other specified disorders of bone density and structure, unspecified site: Secondary | ICD-10-CM | POA: Diagnosis not present

## 2016-03-13 DIAGNOSIS — I129 Hypertensive chronic kidney disease with stage 1 through stage 4 chronic kidney disease, or unspecified chronic kidney disease: Secondary | ICD-10-CM | POA: Diagnosis not present

## 2016-03-13 DIAGNOSIS — E559 Vitamin D deficiency, unspecified: Secondary | ICD-10-CM | POA: Diagnosis not present

## 2016-03-13 DIAGNOSIS — E785 Hyperlipidemia, unspecified: Secondary | ICD-10-CM | POA: Diagnosis not present

## 2016-03-19 DIAGNOSIS — H2513 Age-related nuclear cataract, bilateral: Secondary | ICD-10-CM | POA: Diagnosis not present

## 2016-03-19 DIAGNOSIS — H40013 Open angle with borderline findings, low risk, bilateral: Secondary | ICD-10-CM | POA: Diagnosis not present

## 2016-03-19 DIAGNOSIS — H35363 Drusen (degenerative) of macula, bilateral: Secondary | ICD-10-CM | POA: Diagnosis not present

## 2016-03-19 DIAGNOSIS — H25013 Cortical age-related cataract, bilateral: Secondary | ICD-10-CM | POA: Diagnosis not present

## 2016-03-20 DIAGNOSIS — E785 Hyperlipidemia, unspecified: Secondary | ICD-10-CM | POA: Diagnosis not present

## 2016-03-20 DIAGNOSIS — I10 Essential (primary) hypertension: Secondary | ICD-10-CM | POA: Diagnosis not present

## 2016-03-20 DIAGNOSIS — M109 Gout, unspecified: Secondary | ICD-10-CM | POA: Diagnosis not present

## 2016-05-15 DIAGNOSIS — R05 Cough: Secondary | ICD-10-CM | POA: Diagnosis not present

## 2016-09-20 ENCOUNTER — Other Ambulatory Visit: Payer: Self-pay | Admitting: Internal Medicine

## 2016-09-20 DIAGNOSIS — Z1231 Encounter for screening mammogram for malignant neoplasm of breast: Secondary | ICD-10-CM

## 2016-10-02 ENCOUNTER — Ambulatory Visit (INDEPENDENT_AMBULATORY_CARE_PROVIDER_SITE_OTHER): Payer: Medicare Other | Admitting: Cardiology

## 2016-10-02 ENCOUNTER — Ambulatory Visit: Payer: Medicare Other | Admitting: Cardiology

## 2016-10-02 VITALS — BP 152/92 | HR 64 | Ht 62.0 in | Wt 210.0 lb

## 2016-10-02 DIAGNOSIS — Z01818 Encounter for other preprocedural examination: Secondary | ICD-10-CM

## 2016-10-02 DIAGNOSIS — Z8249 Family history of ischemic heart disease and other diseases of the circulatory system: Secondary | ICD-10-CM

## 2016-10-02 DIAGNOSIS — E784 Other hyperlipidemia: Secondary | ICD-10-CM

## 2016-10-02 DIAGNOSIS — E669 Obesity, unspecified: Secondary | ICD-10-CM

## 2016-10-02 DIAGNOSIS — R011 Cardiac murmur, unspecified: Secondary | ICD-10-CM

## 2016-10-02 DIAGNOSIS — I10 Essential (primary) hypertension: Secondary | ICD-10-CM

## 2016-10-02 DIAGNOSIS — E7849 Other hyperlipidemia: Secondary | ICD-10-CM

## 2016-10-02 MED ORDER — LOSARTAN POTASSIUM 100 MG PO TABS: 100.0000 mg | ORAL_TABLET | Freq: Every day | ORAL | 3 refills | Status: DC

## 2016-10-02 NOTE — Patient Instructions (Addendum)
SCHEDULE AT [address] Your physician has requested that you have an echocardiogram. Echocardiography is a painless test that uses sound waves to create images of your heart. It provides your doctor with information about the size and shape of your heart and how well your heart's chambers and valves are working. This procedure takes approximately one hour. There are no restrictions for this procedure.   Non-Cardiac CT scanning, (CAT scanning), is a noninvasive, special x-ray that produces cross-sectional images of the body using x-rays and a computer - rule out  abd aneurysm. CT scans help physicians diagnose and treat medical conditions. For some CT exams, a contrast material is used to enhance visibility in the area of the body being studied. CT scans provide greater clarity and reveal more details than regular x-ray exams.WILL NEED LAB WORK FOR CT SCAN   Medication change Increase  LOSARTAN 100 MG - ONE TABLET DAILY    Your physician wants you to follow-up in 12 MONTHS WITH DR HARDING. You will receive a reminder letter in the mail two months in advance. If you don't receive a letter, please call our office to schedule the follow-up appointment.

## 2016-10-02 NOTE — Progress Notes (Signed)
PCP: Thayer Headings, MD  Clinic Note: Chief Complaint  Patient presents with  . Follow-up    pt denied chest and SOB    HPI: Sonia Beck is a 71 y.o. female with a PMH below who presents today for 2 yr f/u For blood pressure monitoring. With history of murmur, obesity and hypertension/hyperlipidemia. She is a patient of Dr. Ronne Binning (who unfortunately is on medical leave, and covered by a PA). She had been on a relatively strict diet regimen called MediFast.  She had done very well and lost quite a bit of weight and had been him come off of blood pressure medications, but now she is no longer on that diet and is starting to gain weight back. With that her blood pressures improved worsened as well.  Sonia Beck was last seenIn July 2016.  Recent Hospitalizations: She had an ER visit in October 2017 for nausea and vomiting.  Studies Personally Reviewed - (if available, images/films reviewed: From Epic Chart or Care Everywhere)  *No new studies since 2013  Labs recently checked by PCP. Not available at time of this visit.  Interval History: Sonia Beck presents today really overall doing fairly well. She is kind of upset about her weight gain and tends to try to get back into a diet plan that is not as strict as what she was on. She is now doing weight watchers and is trying to do walking maybe 2-3 days a week. He has not really had any cardiac symptoms of chest tightness or pressure with rest or exertion. She recently saw her PCP a few weeks ago and her blood pressure is well-controlled. She also had labs checked.  She has mild edema, but nothing significant. Now that she is a little more deconditioned and not exercising she does get some exertional dyspnea, but not really to the extent of concern. No PND, orthopnea or edema. No palpitations, lightheadedness, dizziness, weakness or syncope/near syncope. No TIA/amaurosis fugax symptoms. No claudication.  ROS: A comprehensive  was performed. Review of Systems  Constitutional: Negative for malaise/fatigue.  HENT: Negative for congestion.   Respiratory: Negative for cough, sputum production and wheezing.   Cardiovascular:       Per history of present illness  Gastrointestinal: Negative for blood in stool, heartburn and melena.  Genitourinary: Negative for hematuria and urgency.  Musculoskeletal: Positive for joint pain (Normal arthritis pains).  Neurological: Negative for dizziness and focal weakness.  Endo/Heme/Allergies: Positive for environmental allergies.  Psychiatric/Behavioral: Negative for depression and memory loss. The patient is not nervous/anxious and does not have insomnia.   All other systems reviewed and are negative.  I have reviewed and (if needed) personally updated the patient's problem list, medications, allergies, past medical and surgical history, social and family history.   Past Medical History:  Diagnosis Date  . Anxiety   . Asthma   . Cardiac murmur Echo October 2013   Aortic sclerosis, not stenosis  . Depression   . Eczema   . Gout   . Hyperlipidemia   . Hypertension   . Obesity (BMI 30-39.9)    Former weight 224 pounds with BMI of 41 -- 65 pound weight loss in one year  . Thrombocytopenia (HCC)     Past Surgical History:  Procedure Laterality Date  . DOPPLER ECHOCARDIOGRAPHY  03/2006   ESSENTIALLY norm,andno valvular lesions despite hearing murmur  . DOPPLER ECHOCARDIOGRAPHY  01/16/2012   ef =>55% lv norm  . NM MYOCAR PERF WALL MOTION  01/30/2001  EF 69% ,LOW RISK,mild ant wall thinning due to breast attenuation    Current Meds  Medication Sig  . albuterol (PROVENTIL HFA;VENTOLIN HFA) 108 (90 BASE) MCG/ACT inhaler Inhale 2 puffs into the lungs every 6 (six) hours as needed for shortness of breath.   . ALPRAZolam (XANAX) 0.25 MG tablet Take 0.25 mg by mouth 2 (two) times daily as needed for anxiety.   [name] Kitchen. amLODipine (NORVASC) 10 MG tablet Take 10 mg by mouth daily.     . calcium-vitamin D (OSCAL WITH D) 500-200 MG-UNIT per tablet Take 1 tablet by mouth daily.  [name] Kitchen. FLUoxetine (PROZAC) 20 MG capsule Take 20 mg by mouth daily.  . montelukast (SINGULAIR) 10 MG tablet Take 10 mg by mouth at bedtime.  . ondansetron (ZOFRAN) 8 MG tablet Take 8 mg by mouth every 8 (eight) hours as needed for nausea or vomiting.   . potassium chloride SA (K-DUR,KLOR-CON) 20 MEQ tablet Take 20 mEq by mouth daily.  . rosuvastatin (CRESTOR) 5 MG tablet Take 5 mg by mouth every Sunday.   . triamterene-hydrochlorothiazide (MAXZIDE-25) 37.5-25 MG per tablet Take 1 tablet by mouth daily.  . [DISCONTINUED] losartan (COZAAR) 50 MG tablet Take 50 mg by mouth daily.     Allergies  Allergen Reactions  . Erythromycin Other (See Comments)    Stomach cramps.  . Fosamax [Alendronate] Other (See Comments)    Joint pain.    Social History   Social History  . Marital status: Single    Spouse name: N/A  . Number of children: N/A  . Years of education: N/A   Social History Main Topics  . Smoking status: Never Smoker  . Smokeless tobacco: Never Used  . Alcohol use No  . Drug use: No  . Sexual activity: Not Asked   Other Topics Concern  . None   Social History Narrative   She is a single with one child and adopted grandson who lives with her. She does not drink or smoke.      She is now on the maintenance phase of the MediFast diet. Exercising at least 4 times a week [place] doing cardio exercises, otherwise walking at home and the other days, and remaining active otherwise.    family history includes Heart attack (age of onset: 4650) in her brother; Heart disease in her mother.  Wt Readings from Last 3 Encounters:  10/02/16 210 lb (95.3 kg)  11/04/14 177 lb (80.3 kg)  11/10/12 159 lb 6.4 oz (72.3 kg)  Had been on a very difficult to maintain diet - gained all weight back when stopped.  PHYSICAL EXAM BP (!) 152/92   Pulse 64   Ht 5\' 2"  (1.575 m)   Wt 210  lb (95.3 kg)   BMI 38.41 kg/m  General appearance: alert, cooperative, appears stated age, no distress. ~morbidly obese; well-groomed. HEENT: [place]/AT, EOMI, MMM, anicteric sclera Neck: no adenopathy, no carotid bruit and no JVD Lungs: clear to auscultation bilaterally, normal percussion bilaterally and non-labored Heart: regular rate and rhythm, S1 &S2 normal, no click, rub or gallop; soft 1/6 SEM at RUSB. Early peaking without radiation. Abdomen: soft, non-tender; bowel sounds normal; no masses,  no organomegaly; no HJR Extremities: extremities normal, atraumatic, no cyanosis, or edema Pulses: 2+ and symmetric;  Skin: mobility and turgor normal and no evidence of bleeding or bruising  Neurologic: Mental status: Alert & oriented x 3, thought content appropriate; non-focal exam.  Pleasant mood & affect.   Adult  ECG Report  Rate: 64 ;  Rhythm: normal sinus rhythm and Nonspecific ST-T wave changes; otherwise normal axis, intervals and durations  Narrative Interpretation: stable   Other studies Reviewed: Additional studies/ records that were reviewed today include:  Recent Labs:   Recently checked by PCP - not available @ time of visit.    ASSESSMENT / PLAN: Problem List Items Addressed This Visit    Cardiac murmur (Chronic)    She is due for follow-up of her echocardiogram just to ensure that the air sclerosis is not progressing to stenosis.  Plan: 2-D echo      Relevant Orders   EKG 12-Lead (Completed)   ECHOCARDIOGRAM COMPLETE   Basic metabolic panel   CT ANGIO ABDOMEN W &/OR WO CONTRAST   Essential hypertension - Primary (Chronic)    She is hypertensive today, but she said her blood pressure greater PCPs office.  I think that she mentioned being in the 130s over 80s. With that in mind, the fact that she is hypertensive here will then increase her Cozaar to 100 mg daily. We are rechecking her renal function for CT angiogram, so we can make sure that were not worsening her renal  function higher dose of Cozaar with Maxide.      Relevant Medications   losartan (COZAAR) 100 MG tablet   Other Relevant Orders   EKG 12-Lead (Completed)   ECHOCARDIOGRAM COMPLETE   Basic metabolic panel   CT ANGIO ABDOMEN W &/OR WO CONTRAST   Family history of abdominal aortic aneurysm (AAA) (Chronic)    Actually history of thoracic aortic aneurysm. She is most concerned by this last time. Given her risk factors of hypertension, hyperlipidemia and obesity. We will order screening CT angiogram. I think that's probably better than the Doppler given her body habitus.  We do need to check renal function prior to ordering CT angiogram      Relevant Orders   EKG 12-Lead (Completed)   CT ANGIO ABDOMEN W &/OR WO CONTRAST   ECHOCARDIOGRAM COMPLETE   Basic metabolic panel   CT ANGIO ABDOMEN W &/OR WO CONTRAST   Hyperlipidemia (Chronic)    She is on Crestor. Her PCP is monitoring her lipids. They were just checked. She seems to think the overall goal. For her risk factors her LDL target should be less than 100 and closer to 70.      Relevant Medications   losartan (COZAAR) 100 MG tablet   Other Relevant Orders   EKG 12-Lead (Completed)   Obesity (BMI 30-39.9) (Chronic)    She has put back on about all the weight that she lost. She is now starting to do Weight Watchers and exercising to 3 days a week. She has good expectations of weight loss. We talked about the importance of a more gradual weight loss plan.      Relevant Orders   EKG 12-Lead (Completed)    Other Visit Diagnoses    Pre-op testing       Relevant Orders   Basic metabolic panel      Current medicines are reviewed at length with the patient today. (+/- concerns) None The following changes have been made: Increase losartan to 100 mg  Patient Instructions  SCHEDULE AT [address] Your physician has requested that you have an echocardiogram. Echocardiography is a painless test that uses sound  waves to create images of your heart. It provides your doctor with information about the size and shape of your heart  and how well your heart's chambers and valves are working. This procedure takes approximately one hour. There are no restrictions for this procedure.   Non-Cardiac CT scanning, (CAT scanning), is a noninvasive, special x-ray that produces cross-sectional images of the body using x-rays and a computer - rule out  abd aneurysm. CT scans help physicians diagnose and treat medical conditions. For some CT exams, a contrast material is used to enhance visibility in the area of the body being studied. CT scans provide greater clarity and reveal more details than regular x-ray exams.WILL NEED LAB WORK FOR CT SCAN   Medication change Increase  LOSARTAN 100 MG - ONE TABLET DAILY    Your physician wants you to follow-up in 12 MONTHS WITH DR Therman Hughlett. You will receive a reminder letter in the mail two months in advance. If you don't receive a letter, please call our office to schedule the follow-up appointment.    Studies Ordered:   Orders Placed This Encounter  Procedures  . CT ANGIO ABDOMEN W &/OR WO CONTRAST  . CT ANGIO ABDOMEN W &/OR WO CONTRAST  . Basic metabolic panel  . EKG 12-Lead  . ECHOCARDIOGRAM COMPLETE      Bryan Lemma, M.D., M.S. Interventional Cardiologist   Pager # [phone] Phone # [phone] [address] [place], [place] [address]

## 2016-10-05 ENCOUNTER — Encounter: Payer: Self-pay | Admitting: Cardiology

## 2016-10-05 NOTE — Assessment & Plan Note (Signed)
She is due for follow-up of her echocardiogram just to ensure that the air sclerosis is not progressing to stenosis.  Plan: 2-D echo

## 2016-10-05 NOTE — Assessment & Plan Note (Addendum)
She has put back on about all the weight that she lost. She is now starting to do Weight Watchers and exercising to 3 days a week. She has good expectations of weight loss. We talked about the importance of a more gradual weight loss plan.

## 2016-10-05 NOTE — Assessment & Plan Note (Signed)
She is hypertensive today, but she said her blood pressure greater PCPs office.  I think that she mentioned being in the 130s over 80s. With that in mind, the fact that she is hypertensive here will then increase her Cozaar to 100 mg daily. We are rechecking her renal function for CT angiogram, so we can make sure that were not worsening her renal function higher dose of Cozaar with Maxide.

## 2016-10-05 NOTE — Assessment & Plan Note (Addendum)
Actually history of thoracic aortic aneurysm. She is most concerned by this last time. Given her risk factors of hypertension, hyperlipidemia and obesity. We will order screening CT angiogram. I think that's probably better than the Doppler given her body habitus.  We do need to check renal function prior to ordering CT angiogram

## 2016-10-05 NOTE — Assessment & Plan Note (Addendum)
She is on Crestor. Her PCP is monitoring her lipids. They were just checked. She seems to think the overall goal. For her risk factors her LDL target should be less than 100 and closer to 70.

## 2016-10-06 HISTORY — PX: TRANSTHORACIC ECHOCARDIOGRAM: SHX275

## 2016-10-06 HISTORY — PX: DOPPLER ECHOCARDIOGRAPHY: SHX263

## 2016-10-08 ENCOUNTER — Ambulatory Visit (INDEPENDENT_AMBULATORY_CARE_PROVIDER_SITE_OTHER)
Admission: RE | Admit: 2016-10-08 | Discharge: 2016-10-08 | Disposition: A | Discharge status: Home / Self Care | Payer: Medicare Other | Source: Ambulatory Visit | Attending: Cardiology | Admitting: Cardiology

## 2016-10-08 DIAGNOSIS — R011 Cardiac murmur, unspecified: Secondary | ICD-10-CM

## 2016-10-08 DIAGNOSIS — Z8249 Family history of ischemic heart disease and other diseases of the circulatory system: Secondary | ICD-10-CM | POA: Diagnosis not present

## 2016-10-08 DIAGNOSIS — I10 Essential (primary) hypertension: Secondary | ICD-10-CM

## 2016-10-08 MED ORDER — IOPAMIDOL (ISOVUE-370) INJECTION 76%
100.0000 mL | Freq: Once | INTRAVENOUS | Status: AC | PRN
  Administered 2016-10-08: 100 mL via INTRAVENOUS

## 2016-10-10 ENCOUNTER — Ambulatory Visit
Admission: RE | Admit: 2016-10-10 | Discharge: 2016-10-10 | Disposition: A | Discharge status: Home / Self Care | Payer: Medicare Other | Source: Ambulatory Visit | Attending: Internal Medicine | Admitting: Internal Medicine

## 2016-10-10 DIAGNOSIS — Z1231 Encounter for screening mammogram for malignant neoplasm of breast: Secondary | ICD-10-CM

## 2016-10-11 ENCOUNTER — Ambulatory Visit (HOSPITAL_COMMUNITY): Payer: Medicare Other | Attending: Cardiovascular Disease

## 2016-10-11 ENCOUNTER — Telehealth: Payer: Self-pay | Admitting: *Deleted

## 2016-10-11 ENCOUNTER — Other Ambulatory Visit: Payer: Self-pay

## 2016-10-11 DIAGNOSIS — Z6838 Body mass index (BMI) 38.0-38.9, adult: Secondary | ICD-10-CM | POA: Diagnosis not present

## 2016-10-11 DIAGNOSIS — E785 Hyperlipidemia, unspecified: Secondary | ICD-10-CM | POA: Diagnosis not present

## 2016-10-11 DIAGNOSIS — I119 Hypertensive heart disease without heart failure: Secondary | ICD-10-CM | POA: Insufficient documentation

## 2016-10-11 DIAGNOSIS — E669 Obesity, unspecified: Secondary | ICD-10-CM | POA: Insufficient documentation

## 2016-10-11 DIAGNOSIS — Z8249 Family history of ischemic heart disease and other diseases of the circulatory system: Secondary | ICD-10-CM | POA: Diagnosis not present

## 2016-10-11 DIAGNOSIS — I10 Essential (primary) hypertension: Secondary | ICD-10-CM

## 2016-10-11 DIAGNOSIS — R011 Cardiac murmur, unspecified: Secondary | ICD-10-CM | POA: Diagnosis present

## 2016-10-11 NOTE — Telephone Encounter (Signed)
LEFT MESSAGE TO CALL BACK IN REGARDS TO TEST RESULTS 

## 2016-10-11 NOTE — Telephone Encounter (Signed)
-----   Message from Marykay Lexavid W Harding, MD sent at 10/11/2016 12:00 AM EDT ----- Randie HeinzGreat News - the CT Angiogram of the Abdomen showed only the Aorta to have mild tortuosity & mild plaquing with no evidence of narrowing or dilation (aneurysm or even to a lesser extent, ectasia). The major branch vessels were also essentially normal. It also shows a small hiatal hernia & a few scattered small diverticula. There is also some evidence of mild DJD/arthritis changes in the Lumbar (lower back) spine.   Bryan Lemmaavid Harding, MD  Pls forward to Thayer HeadingsMackenzie, Brian, MD (or his office)

## 2016-10-14 NOTE — Telephone Encounter (Signed)
Follow up    Pt is calling back regarding test results.

## 2016-10-14 NOTE — Telephone Encounter (Signed)
Follow Up    Pt requests a call back around 2PM today

## 2016-10-14 NOTE — Telephone Encounter (Signed)
Pt notified, pt will await ECHO results

## 2016-10-21 NOTE — Progress Notes (Unsigned)
Wed

## 2017-10-03 ENCOUNTER — Ambulatory Visit: Payer: Medicare Other | Admitting: Cardiology

## 2017-10-06 ENCOUNTER — Encounter: Payer: Self-pay | Admitting: Cardiology

## 2017-10-06 ENCOUNTER — Ambulatory Visit: Payer: Medicare Other | Admitting: Cardiology

## 2017-10-06 VITALS — BP 142/77 | HR 61 | Ht 61.0 in | Wt 168.2 lb

## 2017-10-06 DIAGNOSIS — Z8249 Family history of ischemic heart disease and other diseases of the circulatory system: Secondary | ICD-10-CM

## 2017-10-06 DIAGNOSIS — R011 Cardiac murmur, unspecified: Secondary | ICD-10-CM

## 2017-10-06 DIAGNOSIS — E669 Obesity, unspecified: Secondary | ICD-10-CM | POA: Diagnosis not present

## 2017-10-06 DIAGNOSIS — E7849 Other hyperlipidemia: Secondary | ICD-10-CM

## 2017-10-06 DIAGNOSIS — I10 Essential (primary) hypertension: Secondary | ICD-10-CM | POA: Diagnosis not present

## 2017-10-06 NOTE — Patient Instructions (Signed)
No medication changes   Monitor Blood pressure periodically- especially after exercise - wait 10- 15 minutes after exercising. You may keep a log of readings.  May show to primary as well can manage if needed.  May record once or twice week.     Your physician wants you to follow-up in 12 months ( July 2019) [facility]. You will receive a reminder letter in the mail two months in advance. If you don't receive a letter, please call our office to schedule the follow-up appointment.    If you need a refill on your cardiac medications before your next appointment, please call your pharmacy.

## 2017-10-06 NOTE — Progress Notes (Signed)
PCP: Georgianne Fick, MD  Clinic Note: Chief Complaint  Patient presents with  . Follow-up    12 months; cardiac risk factors    HPI: Sonia Beck is a 72 y.o. female with a PMH below who presents today for annual follow-up for hypertension, hyperlipidemia, obesity and history of heart murmur. She has been on and off of a MediFast diet with weight loss she came off of some of her blood pressure medicines. -This was being managed by her PCP (Dr. Ronne Binning.  Unfortunately he is now on medical leave.  Sonia Beck was last seen on October 02, 2016.  She is doing quite well.  Unfortunately had gained weight after proving unable to maintain her very strict diet.  She then started to go on weight watchers.  But otherwise from a cardiac standpoint besides mild edema was asymptomatic.  Recent Hospitalizations:   None  Studies Personally Reviewed - (if available, images/films reviewed: From Epic Chart or Care Everywhere)  Echo July 2018::  mild LVH. focal basal hypertrophy.  Normal systolic function - EF 55% to 16%. Wall motion was normal; grade 1 diastolic dysfunction.   mildly increased. PA - peak pressure: 33 mm Hg (S).  July 2018 CTA Abd: Mild aortoiliac arterial plaque without aneurysm.  Interval History: Sonia Beck presents today overall doing much better.  She is now on a relatively strict and has lost quite a bit of weight.  Regimen of adjusted diet and increased exercise, she is routinely going to the [facility] doing exercises but is  no longer trying any of the "fad diets ".  At the [facility], her blood pressures tend to run somewhere between the 130s to 140s systolic.  She tells me she is always nervous when she has her blood pressure checked. She is otherwise totally astigmatic.  Her edema is pretty well controlled.  No PND, orthopnea.  No chest tightness or pressure with rest or exertion. No palpitations, lightheadedness, dizziness, weakness or syncope/near syncope. No TIA/amaurosis  fugax symptoms. No claudication.  ROS: A comprehensive was performed. Review of Systems  Constitutional: Positive for weight loss. Negative for malaise/fatigue.  HENT: Negative for congestion and nosebleeds.   Respiratory: Negative for cough, shortness of breath and wheezing.   Cardiovascular: Negative for claudication and leg swelling.  Gastrointestinal: Negative for blood in stool, constipation, heartburn and melena.  Genitourinary: Negative for hematuria.  Musculoskeletal: Positive for joint pain. Negative for falls.  Neurological: Negative for dizziness, focal weakness and weakness.  Psychiatric/Behavioral: Negative for depression and memory loss. The patient is not nervous/anxious and does not have insomnia.   All other systems reviewed and are negative.  I have reviewed and (if needed) personally updated the patient's problem list, medications, allergies, past medical and surgical history, social and family history.   Past Medical History:  Diagnosis Date  . Anxiety   . Asthma   . Cardiac murmur Echo October 2013   Aortic sclerosis, not stenosis  . Depression   . Eczema   . Gout   . Hyperlipidemia   . Hypertension   . Obesity (BMI 30-39.9)    Former weight 224 pounds with BMI of 41 -- 65 pound weight loss in one year  . Thrombocytopenia (HCC)     Past Surgical History:  Procedure Laterality Date  . DOPPLER ECHOCARDIOGRAPHY  03/2006   ESSENTIALLY norm,andno valvular lesions despite hearing murmur  . DOPPLER ECHOCARDIOGRAPHY  01/16/2012   ef =>55% lv norm  . NM MYOCAR PERF WALL MOTION  01/30/2001   EF 69% ,LOW RISK,mild ant wall thinning due to breast attenuation    Current Meds  Medication Sig  . ALPRAZolam (XANAX) 0.25 MG tablet Take 0.25 mg by mouth 2 (two) times daily as needed for anxiety.   [name] Kitchen amLODipine (NORVASC) 10 MG tablet Take 10 mg by mouth daily.   . calcium-vitamin D (OSCAL WITH D) 500-200 MG-UNIT per tablet Take 1 tablet by mouth daily.  [name] Kitchen FLUoxetine  (PROZAC) 20 MG capsule Take 20 mg by mouth daily.  [name] Kitchen losartan (COZAAR) 100 MG tablet Take 1 tablet (100 mg total) by mouth daily.  . montelukast (SINGULAIR) 10 MG tablet Take 10 mg by mouth at bedtime.  . ondansetron (ZOFRAN) 8 MG tablet Take 8 mg by mouth every 8 (eight) hours as needed for nausea or vomiting.   . potassium chloride SA (K-DUR,KLOR-CON) 20 MEQ tablet Take 20 mEq by mouth daily.  . rosuvastatin (CRESTOR) 5 MG tablet Take 5 mg by mouth every Sunday.   . triamterene-hydrochlorothiazide (MAXZIDE-25) 37.5-25 MG per tablet Take 1 tablet by mouth daily.  . [DISCONTINUED] albuterol (PROVENTIL HFA;VENTOLIN HFA) 108 (90 BASE) MCG/ACT inhaler Inhale 2 puffs into the lungs every 6 (six) hours as needed for shortness of breath.     Allergies  Allergen Reactions  . Erythromycin Other (See Comments)    Stomach cramps.  . Fosamax [Alendronate] Other (See Comments)    Joint pain.    Social History   Tobacco Use  . Smoking status: Never Smoker  . Smokeless tobacco: Never Used  Substance Use Topics  . Alcohol use: No  . Drug use: No   Social History   Social History Narrative   She is a single with one child and adopted grandson who lives with her. She does not drink or smoke.      She is now on the maintenance phase of the MediFast diet. Exercising at least 4 times a [place] doing cardio exercises, otherwise walking at home and the other days, and remaining active otherwise.    family history includes Heart attack (age of onset: 64) in her brother; Heart disease in her mother.  Wt Readings from Last 3 Encounters:  10/06/17 168 lb 4 oz (76.3 kg)  10/02/16 210 lb (95.3 kg)  11/04/14 177 lb (80.3 kg)  - Lifestyle Modification - dietary change & increased exercise  PHYSICAL EXAM BP (!) 142/77   Pulse 61   Ht 5\' 1"  (1.549 m)   Wt 168 lb 4 oz (76.3 kg)   BMI 31.79 kg/m  Physical Exam  Constitutional: She is oriented to person, place, and time.  She appears well-developed and well-nourished. No distress.  HENT:  Head: Normocephalic and atraumatic.  Neck: No hepatojugular reflux and no JVD present. Carotid bruit is not present.  Cardiovascular: Normal rate and regular rhythm.  No extrasystoles are present. PMI is not displaced. Exam reveals no gallop and no friction rub.  Murmur heard.  Harsh crescendo-decrescendo early systolic murmur is present with a grade of 1/6 at the upper right sternal border radiating to the neck. Pulmonary/Chest: Effort normal and breath sounds normal. No respiratory distress. She has no wheezes. She has no rales.  Abdominal: Soft. Bowel sounds are normal. She exhibits no distension. There is no tenderness. There is no rebound.  Truncal obesity.  No HSM  Musculoskeletal: Normal range of motion. She exhibits no edema.  Neurological: She is alert and oriented to person, place, and time.  Psychiatric: She has a normal mood and affect. Her behavior is normal. Judgment and thought content normal.      Adult ECG Report  Rate: 61 ;  Rhythm: normal sinus rhythm and septal MI, age undetermined;   Narrative Interpretation: stable   Other studies Reviewed: Additional studies/ records that were reviewed today include:   Recent Labs: September 08, 2017 (from PCP) scanned to chart.  Na+ 142, K+ 4.3, Cl- 98, HCO3-27, BUN 14, Cr 0.79, Glu 80, Ca2+ 9.7; AST 21, ALT 16,  AlkP 88,   CBC: W 5.4, H/H 12.2/37.3, Plt 363  March 14, 2017: TC 168, TG 63, HDL 54, LDL 101  ASSESSMENT / PLAN: Problem List Items Addressed This Visit    Obesity (BMI 30-39.9) (Chronic)    Labile weights up and down.  Now with her lifestyle modification changes she has been back down to 168 with a target of 160 pounds.  Hopefully this will help her blood pressure, cholesterol and risk of diabetes. Continue exercise as well.      Hyperlipidemia due to dietary fat intake (Chronic)    On Crestor.  LDL is 101.  Hopefully with weight loss this will  improve.  Has not been checked yet for this year.  Will reassess.  Could potentially increase Crestor dose to 10 mg if necessary.  Will defer to PCP. --Goal LDL should be less than 100      Family history of abdominal aortic aneurysm (AAA) (Chronic)    Negative CTA abdomen.  Probably do not need further evaluation.      Relevant Orders   EKG 12-Lead (Completed)   Essential hypertension - Primary (Chronic)    Blood pressures always tend to run a little bit high when she is coming in for evaluation.  She says that she gets very anxious and nervous about getting her blood pressure check.  For now we will continue with her current medication, but asked that she monitor her pressures [facility].  If her pressures tend to run greater than 140 systolic, I think would need to then add a medication.  Unfortunately there is not a lot of options besides most likely switching from losartan to a more potent ARB such as irbesartan or on olmesartan.      Cardiac murmur (Chronic)    Follow-up echo last year did not comment on aortic stenosis.  Have been noted to be sclerotic on previous echoes.  Was just probably not mentioned.  Murmur seems to be relatively soft, and could be innocent flow murmur.      Relevant Orders   EKG 12-Lead (Completed)      I spent a total of 25 minutes with the patient and chart review. >  50% of the time was spent in direct patient consultation.   Current medicines are reviewed at length with the patient today.  (+/- concerns) none The following changes have been made:  None  Patient Instructions  No medication changes   Monitor Blood pressure periodically- especially after exercise - wait 10- 15 minutes after exercising. You may keep a log of readings.  May show to primary as well can manage if needed.  May record once or twice week.     Your physician wants you to follow-up in 12 months ( July 2019) [facility]. You will receive a reminder  letter in the mail two months in advance. If you don't receive a letter, please call our office to schedule the  follow-up appointment.    If you need a refill on your cardiac medications before your next appointment, please call your pharmacy.      Studies Ordered:   Orders Placed This Encounter  Procedures  . EKG 12-Lead      Bryan Lemma, M.D., M.S. Interventional Cardiologist   Pager # [phone] Phone # [phone] [address] [place], [place] [address]   Thank you for choosing Heartcare at [facility]!!

## 2017-10-09 ENCOUNTER — Encounter: Payer: Self-pay | Admitting: Cardiology

## 2017-10-09 NOTE — Assessment & Plan Note (Signed)
Blood pressures always tend to run a little bit high when she is coming in for evaluation.  She says that she gets very anxious and nervous about getting her blood pressure check.  For now we will continue with her current medication, but asked that she monitor her pressures [facility].  If her pressures tend to run greater than 140 systolic, I think would need to then add a medication.  Unfortunately there is not a lot of options besides most likely switching from losartan to a more potent ARB such as irbesartan or on olmesartan.

## 2017-10-09 NOTE — Assessment & Plan Note (Signed)
Negative CTA abdomen.  Probably do not need further evaluation.

## 2017-10-09 NOTE — Assessment & Plan Note (Signed)
On Crestor.  LDL is 101.  Hopefully with weight loss this will improve.  Has not been checked yet for this year.  Will reassess.  Could potentially increase Crestor dose to 10 mg if necessary.  Will defer to PCP. --Goal LDL should be less than 100

## 2017-10-09 NOTE — Assessment & Plan Note (Signed)
Labile weights up and down.  Now with her lifestyle modification changes she has been back down to 168 with a target of 160 pounds.  Hopefully this will help her blood pressure, cholesterol and risk of diabetes. Continue exercise as well.

## 2017-10-09 NOTE — Assessment & Plan Note (Signed)
Follow-up echo last year did not comment on aortic stenosis.  Have been noted to be sclerotic on previous echoes.  Was just probably not mentioned.  Murmur seems to be relatively soft, and could be innocent flow murmur.

## 2017-10-15 ENCOUNTER — Other Ambulatory Visit: Payer: Self-pay | Admitting: Cardiology

## 2017-10-17 ENCOUNTER — Other Ambulatory Visit: Payer: Self-pay | Admitting: Internal Medicine

## 2017-10-17 DIAGNOSIS — Z1231 Encounter for screening mammogram for malignant neoplasm of breast: Secondary | ICD-10-CM

## 2017-11-06 ENCOUNTER — Ambulatory Visit
Admission: RE | Admit: 2017-11-06 | Discharge: 2017-11-06 | Disposition: A | Discharge status: Home / Self Care | Payer: Medicare Other | Source: Ambulatory Visit | Attending: Internal Medicine | Admitting: Internal Medicine

## 2017-11-06 DIAGNOSIS — Z1231 Encounter for screening mammogram for malignant neoplasm of breast: Secondary | ICD-10-CM

## 2017-12-09 IMAGING — CT CT ANGIO ABDOMEN
2 of 6 series · 15 of 46 positions shown, 17 images · IV contrast (ISOVUE 370)
Comparison: None.

CLINICAL DATA: Family history of abdominal aortic aneurysm.
Hypertension, currently asymptomatic.

EXAM:
CT ANGIOGRAPHY ABDOMEN
TECHNIQUE: Multidetector CT imaging of the abdomen was performed using the
standard protocol during bolus administration of intravenous
contrast. Multiplanar reconstructed images and MIPs were obtained
and reviewed to evaluate the vascular anatomy.
CONTRAST:  100 mL Isovue 370 IV

[Series 4: arterial 3.0 i40f 2 · axial · arterial · 0.67mm/px · z∈[-240,-42]mm · 12 of 74 slices shown, 14 images]
[im 4/74  soft-tissue]
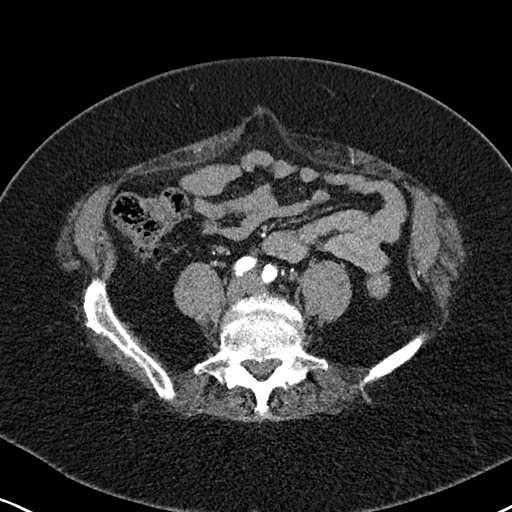
[im 4/74  bone]
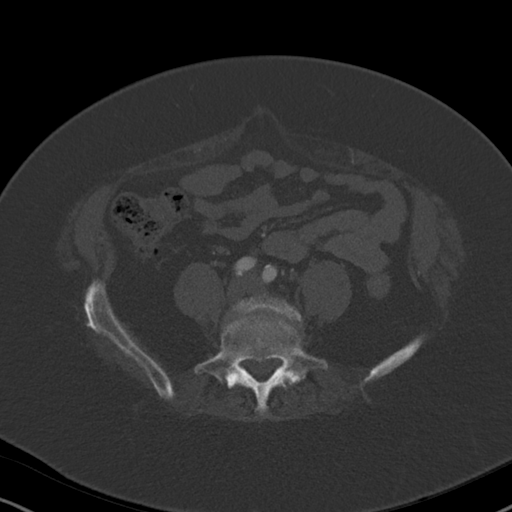
[im 10/74  soft-tissue]
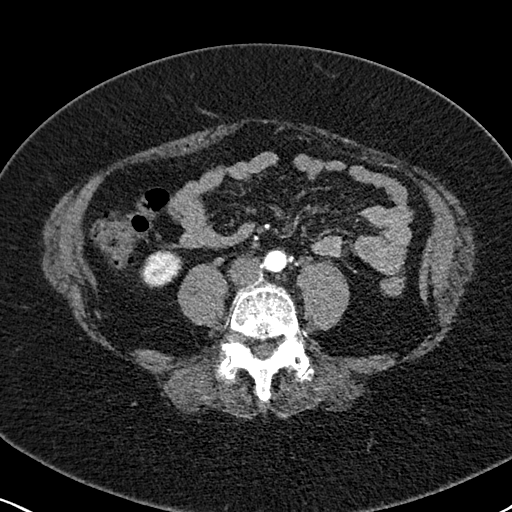
[im 16/74  soft-tissue]
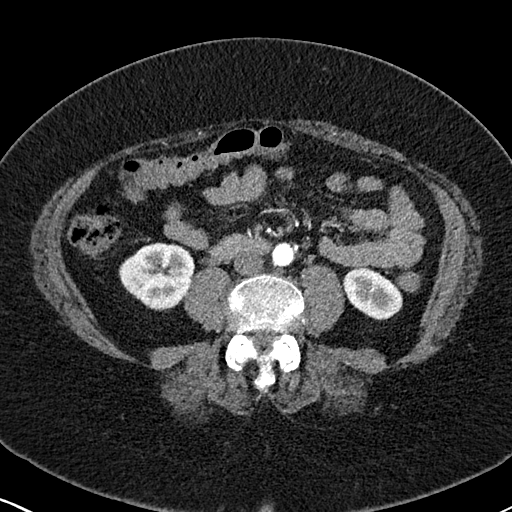
[im 22/74  soft-tissue]
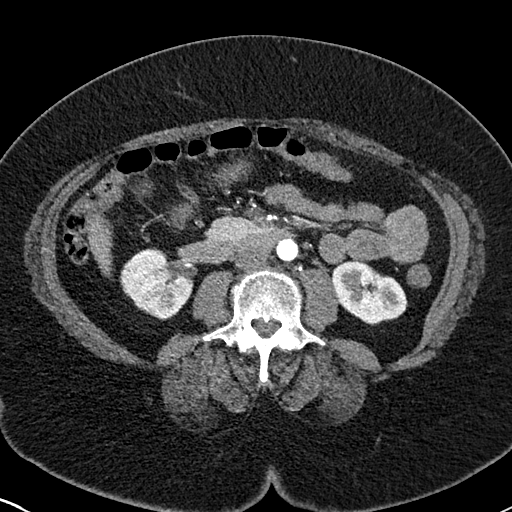
[im 28/74  soft-tissue]
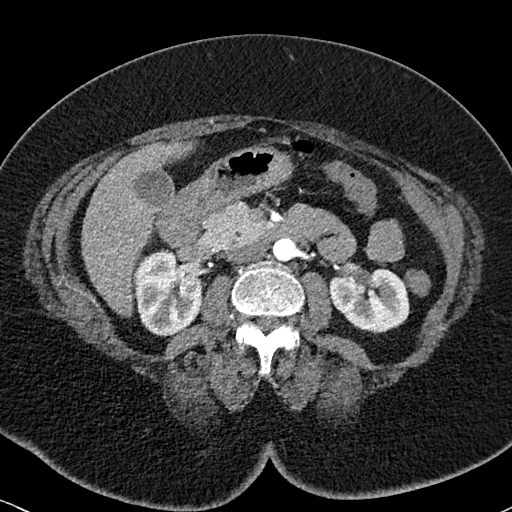
[im 34/74  soft-tissue]
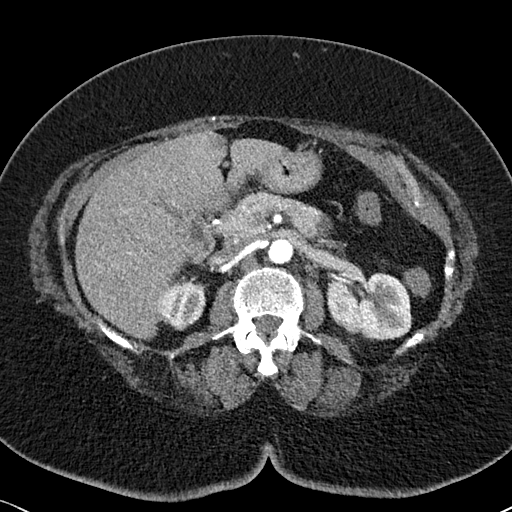
[im 40/74  soft-tissue]
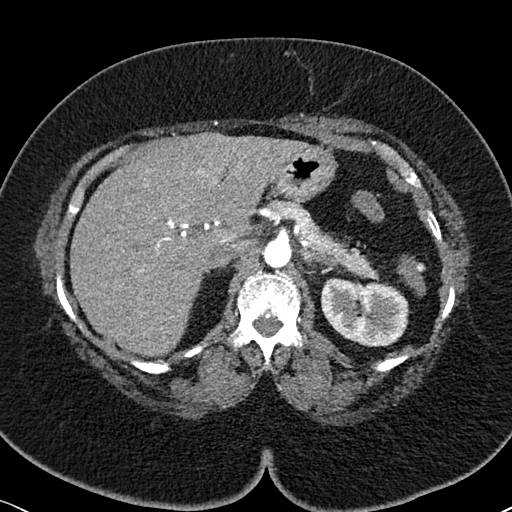
[im 46/74  soft-tissue]
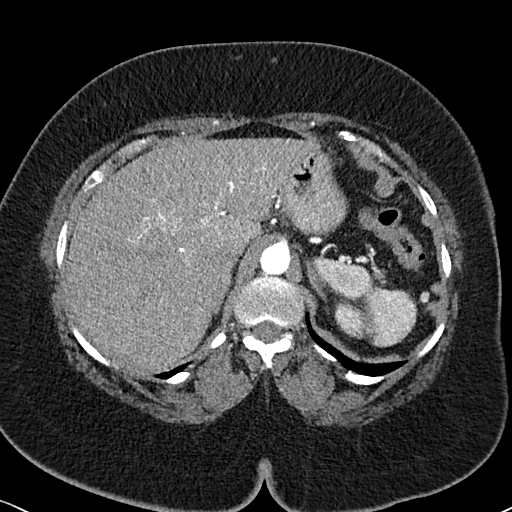
[im 52/74  soft-tissue]
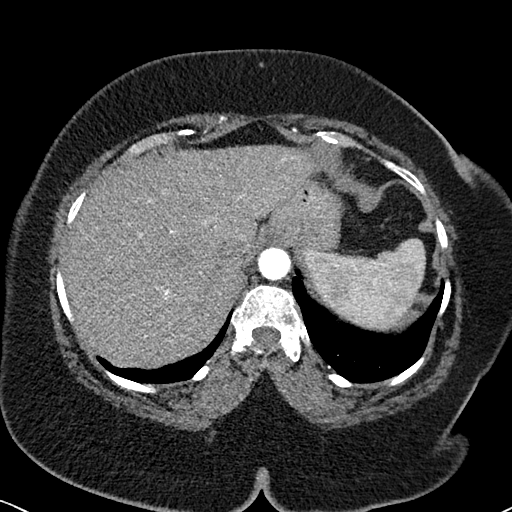
[im 52/74  bone]
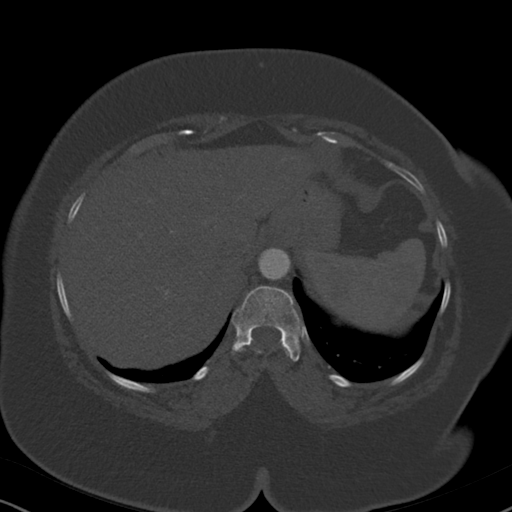
[im 58/74  soft-tissue]
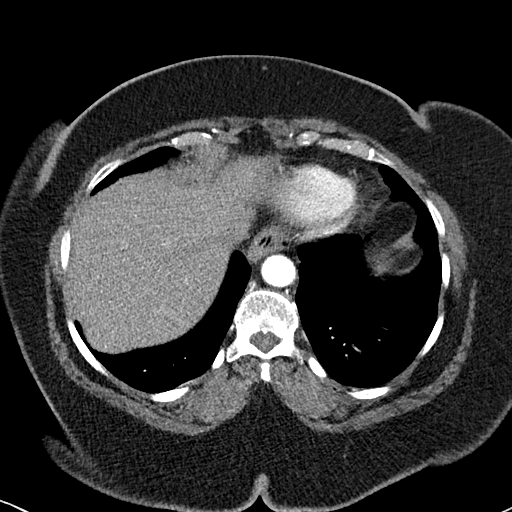
[im 64/74  soft-tissue]
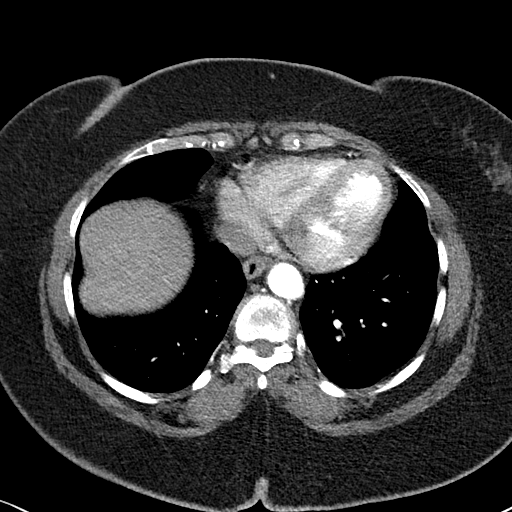
[im 70/74  soft-tissue]
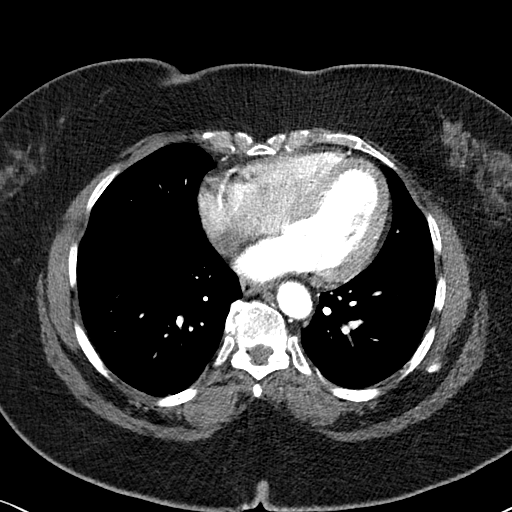

[Series 7: coronals · coronal · 0.50mm/px · 3 of 121 slices shown]
[im 31/121  soft-tissue]
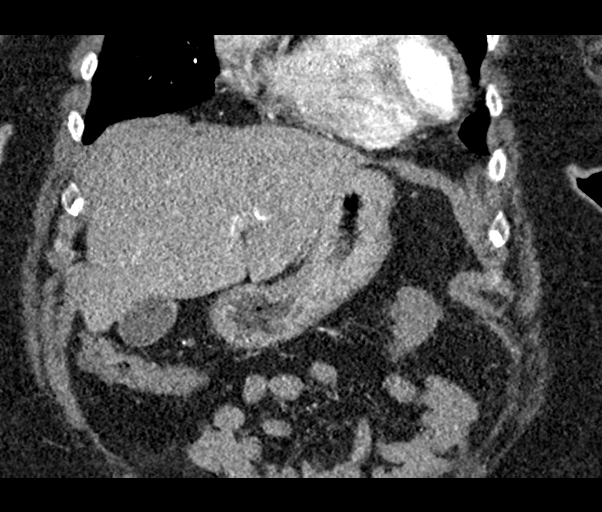
[im 61/121  soft-tissue]
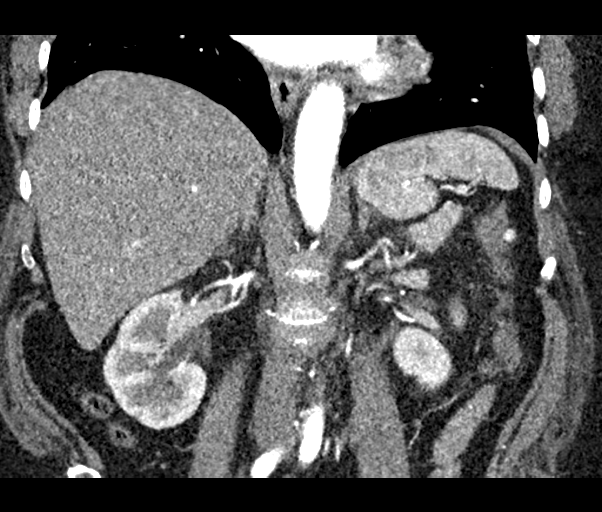
[im 91/121  soft-tissue]
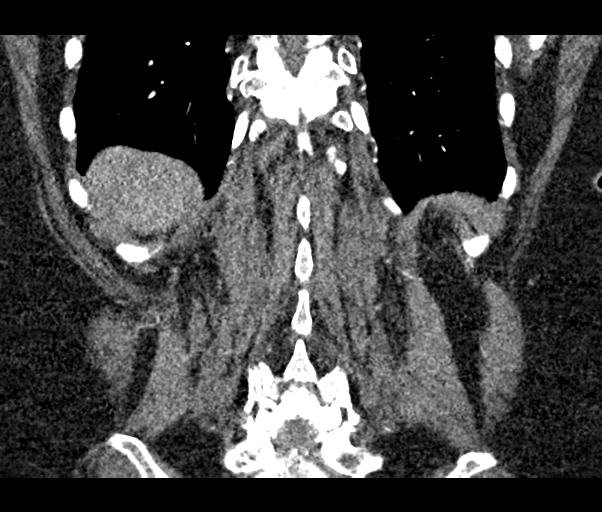

[15 of 46 positions shown; findings below may reference images not displayed]

FINDINGS: VASCULAR

Aorta: Mild tortuosity. No aneurysm, dissection or stenosis. Mild
atheromatous plaque in the infrarenal segment.

Celiac: Patent without evidence of aneurysm, dissection, vasculitis
or significant stenosis.

SMA: Patent without evidence of aneurysm, dissection, vasculitis or
significant stenosis. Replaced right hepatic arterial supply, an
anatomic variant.

Renals: Single right renal artery with partially calcified ostial
plaque resulting in no significant stenosis. Patent distally.

3 left renal arteries. The superior left renal artery is dominant,
widely patent. The middle left renal artery supplies much of the
lower pole. The inferior left renal artery is relatively diminutive,
has an aberrant origin below the level of the IMA, and supplies a
portion of the posterior division.

IMA: Patent without evidence of aneurysm, dissection, vasculitis or
significant stenosis.

Inflow: Visualized proximal common iliac arteries have some
scattered plaque but no stenosis or aneurysm.

Veins: No obvious venous abnormality within the limitations of this
arterial phase study.Dedicated venous phase imaging not obtained.

Review of the MIP images confirms the above findings.

NON-VASCULAR

Lower chest: No acute abnormality.

Hepatobiliary: No focal liver abnormality is seen. No gallstones,
gallbladder wall thickening, or biliary dilatation.

Pancreas: Unremarkable. No pancreatic ductal dilatation or
surrounding inflammatory changes.

Spleen: Normal in size without focal abnormality. Small accessory
splenules.

Adrenals/Urinary Tract: Adrenal glands are unremarkable. Kidneys are
normal, without renal calculi, focal lesion, or hydronephrosis.

Stomach/Bowel: Stomach is nondistended. Small hiatal hernia.
Visualized portions of small bowel and colon are nondilated. A few
small diverticula from the splenic flexure of the colon.

Lymphatic: No abdominal adenopathy localized.

Other: No ascites.  No free air.

Musculoskeletal: Small umbilical hernia containing only mesenteric
fat. Facet DJD in the lower lumbar spine. Grade 1 anterolisthesis
L5-S1 probably related to facet disease; no pars defect. Negative
for fracture or worrisome bone lesion.
IMPRESSION: VASCULAR

1. Mild aortoiliac arterial plaque without aneurysm.

NON-VASCULAR

1. No acute findings.
2. Small hiatal hernia.
3. Scattered colonic diverticula.
4. Lumbar spondylitic changes.

## 2018-01-13 ENCOUNTER — Other Ambulatory Visit: Payer: Self-pay | Admitting: Cardiology

## 2018-10-09 ENCOUNTER — Other Ambulatory Visit: Payer: Self-pay | Admitting: Cardiology

## 2018-10-12 NOTE — Telephone Encounter (Signed)
Rx(s) sent to pharmacy electronically.  

## 2018-11-02 ENCOUNTER — Other Ambulatory Visit: Payer: Self-pay | Admitting: Cardiology

## 2018-12-09 ENCOUNTER — Other Ambulatory Visit: Payer: Self-pay | Admitting: Cardiology

## 2018-12-17 ENCOUNTER — Other Ambulatory Visit: Payer: Self-pay | Admitting: Cardiology

## 2018-12-17 MED ORDER — LOSARTAN POTASSIUM 100 MG PO TABS: 100.0000 mg | ORAL_TABLET | Freq: Every day | ORAL | 0 refills | Status: DC

## 2018-12-17 NOTE — Telephone Encounter (Signed)
Requested Prescriptions   Signed Prescriptions Disp Refills  . losartan (COZAAR) 100 MG tablet 90 tablet 0    Sig: Take 1 tablet (100 mg total) by mouth daily. PLEASE KEEP UPCOMING APPOINTMENT FOR FURTHER REFILLS.    Authorizing Provider: Leonie Man    Ordering User: Raelene Bott, Weldon Nouri L

## 2018-12-17 NOTE — Telephone Encounter (Signed)
° ° °*  STAT* If patient is at the pharmacy, call can be transferred to refill team.   1. Which medications need to be refilled? (please list name of each medication and dose if known) losartan (COZAAR) 100 MG tablet  2. Which pharmacy/location (including street and city if local pharmacy) is medication to be sent to? [place]  3. Do they need a 30 day or 90 day supply? [place]

## 2019-01-28 ENCOUNTER — Other Ambulatory Visit: Payer: Self-pay | Admitting: Internal Medicine

## 2019-01-28 DIAGNOSIS — Z1231 Encounter for screening mammogram for malignant neoplasm of breast: Secondary | ICD-10-CM

## 2019-02-01 ENCOUNTER — Ambulatory Visit
Admission: RE | Admit: 2019-02-01 | Discharge: 2019-02-01 | Disposition: A | Discharge status: Home / Self Care | Payer: Medicare Other | Source: Ambulatory Visit | Attending: Internal Medicine | Admitting: Internal Medicine

## 2019-02-01 ENCOUNTER — Other Ambulatory Visit: Payer: Self-pay

## 2019-02-01 DIAGNOSIS — Z1231 Encounter for screening mammogram for malignant neoplasm of breast: Secondary | ICD-10-CM

## 2019-02-08 ENCOUNTER — Telehealth: Payer: Self-pay | Admitting: *Deleted

## 2019-02-08 NOTE — Telephone Encounter (Signed)
   TELEPHONE CALL NOTE  This patient has been deemed a candidate for follow-up tele-health visit to limit community exposure during the Covid-19 pandemic. I spoke with the patient via phone to discuss instructions.    A Virtual Office Visit appointment type has been scheduled for 02/10/19 with [facility], with "VIDEO" - EMAIL   I have  conFIRMED the patient is active in [place],      Raiford Simmonds, RN 02/08/2019 3:36 PM

## 2019-02-09 ENCOUNTER — Ambulatory Visit: Payer: Medicare Other | Admitting: Cardiology

## 2019-02-10 ENCOUNTER — Telehealth: Payer: Self-pay | Admitting: *Deleted

## 2019-02-10 ENCOUNTER — Telehealth (INDEPENDENT_AMBULATORY_CARE_PROVIDER_SITE_OTHER): Payer: Medicare Other | Admitting: Cardiology

## 2019-02-10 ENCOUNTER — Encounter: Payer: Self-pay | Admitting: Cardiology

## 2019-02-10 VITALS — Ht 62.0 in | Wt 208.0 lb

## 2019-02-10 DIAGNOSIS — I10 Essential (primary) hypertension: Secondary | ICD-10-CM | POA: Diagnosis not present

## 2019-02-10 DIAGNOSIS — E669 Obesity, unspecified: Secondary | ICD-10-CM | POA: Diagnosis not present

## 2019-02-10 DIAGNOSIS — E7849 Other hyperlipidemia: Secondary | ICD-10-CM | POA: Diagnosis not present

## 2019-02-10 MED ORDER — LOSARTAN POTASSIUM 100 MG PO TABS: 100.0000 mg | ORAL_TABLET | Freq: Every day | ORAL | 3 refills | Status: DC

## 2019-02-10 NOTE — Progress Notes (Signed)
Virtual Visit via Video Note   This visit type was conducted due to national recommendations for restrictions regarding the COVID-19 Pandemic (e.g. social distancing) in an effort to limit this patient's exposure and mitigate transmission in our community.  Due to her co-morbid illnesses, this patient is at least at moderate risk for complications without adequate follow up.  This format is felt to be most appropriate for this patient at this time.  All issues noted in this document were discussed and addressed.  A limited physical exam was performed with this format.  Please refer to the patient's chart for her consent to telehealth for [facility].   Patient has given verbal permission to conduct this visit via virtual appointment and to bill insurance 02/10/2019 5:24 PM     Evaluation Performed:  Follow-up visit  Date:  02/10/2019   ID:  Sonia Beck, DOB 1945-12-01, MRN [id number]  Patient Location: Home Provider Location: Home  PCP:  Georgianne Fick, MD  Cardiologist:  Bryan Lemma, MD  Electrophysiologist:  None   Chief Complaint:   Chief Complaint  Patient presents with  . 12 MONTH FOLOW UP    NO  CMPLAINTS , WEIGHTWENT UO TO 217 LBS  DURING  CIVD  STARTING OCT 5 WEIGHT WATCHERS  -SHE HAS LOST LBS - SHE IS NOW 208 LBS.     History of Present Illness:    Sonia Beck is a 73 y.o. female with PMH notable for cardiac risk factors of hypertension, hyperlipidemia, obesity as well as history of murmur who presents via audio/video conferencing for a telehealth visit today as a delayed annual follow-up.  In the past she has been on multiple different diet options including the MediFast diet working with Dr. Ronne Binning.  This was some those difficult for her to follow-up on and has now been trying to use weight watchers.  Sonia Beck was last seen in July 2019.  She was doing fairly well but was on a relatively strict diet.  She was doing routine exercises at [facility]  and was commenced to not do the "fad diets ".  Was having borderline blood pressures.  Otherwise asymptomatic.  Hospitalizations:  . None   Prior CV studies:   The following studies were reviewed today: . None:  Inerval History   Sonia Beck is being seen via telemedicine video call.  She actually just returned from shopping and was lying in bed during this phone call.  She stated that she was taking a nap where she did not sleep well last night.  She is little upset at herself because of her weight going up and is actively working on changing her diet.  As a result of her weight be higher she does notice a little bit harder for her to do routine activities and bending over etc.  As she has lost weight, this is notably improved.  Otherwise she is stable from a cardiology standpoint.  Had initially gained a lot of the weight during the COVID-19 lockdown, but is now on weight watchers and has successfully reduced back down to 208 pounds from 217.  Classes & [employer] not back up -- starting to do more exercises around the house & doing housework.   Cardiovascular ROS: positive for - a little DOE w/ increased weight negative for - chest pain, edema, irregular heartbeat, orthopnea, palpitations, paroxysmal nocturnal dyspnea, rapid heart rate, shortness of breath or TIA/amaurosis fugax, syncope / near syncope   ROS:  Please see  the history of present illness.    The patient does not have symptoms concerning for COVID-19 infection (fever, chills, cough, or new shortness of breath).  Review of Systems  Constitutional: Negative for malaise/fatigue (getting back to better energy levels).  HENT: Negative for congestion and nosebleeds.   Respiratory: Negative for shortness of breath.   Cardiovascular: Negative for leg swelling.  Gastrointestinal: Positive for heartburn (worse when lying down -- especially when wgt was up). Negative for blood in stool and melena.  Genitourinary: Negative for  hematuria.  Musculoskeletal: Positive for back pain and joint pain (knee OA pain & elbows). Negative for falls.  Neurological: Negative for dizziness and headaches.  Psychiatric/Behavioral: Negative for depression and memory loss. The patient is not nervous/anxious and does not have insomnia.    The patient is practicing social distancing.   Has a "standard bubble" of locations where she goes - for groceries etc.   Past Medical History:  Diagnosis Date  . Anxiety   . Asthma   . Cardiac murmur Echo October 2013   Aortic sclerosis, not stenosis  . Depression   . Eczema   . Gout   . Hyperlipidemia   . Hypertension   . Obesity (BMI 30-39.9)    Former weight 224 pounds with BMI of 41 -- 65 pound weight loss in one year  . Thrombocytopenia (HCC)    Past Surgical History:  Procedure Laterality Date  . DOPPLER ECHOCARDIOGRAPHY  03/2006   ESSENTIALLY norm,andno valvular lesions despite hearing murmur  . DOPPLER ECHOCARDIOGRAPHY  01/16/2012   ef =>55% lv norm  . NM MYOCAR PERF WALL MOTION  01/30/2001   EF 69% ,LOW RISK,mild ant wall thinning due to breast attenuation     Current Meds  Medication Sig  . acetaminophen (TYLENOL) 500 MG tablet Take 500 mg by mouth as needed. TAKE 2 TABLET AS NEEDED  . ALPRAZolam (XANAX) 0.25 MG tablet Take 0.25 mg by mouth 2 (two) times daily as needed for anxiety.   [name] Kitchen. amLODipine (NORVASC) 10 MG tablet Take 10 mg by mouth daily.   . calcium-vitamin D (OSCAL WITH D) 500-200 MG-UNIT per tablet Take 1 tablet by mouth daily.  [name] Kitchen. FLUoxetine (PROZAC) 20 MG capsule Take 20 mg by mouth daily.  [name] Kitchen. ibuprofen (ADVIL) 200 MG tablet Take 200 mg by mouth as needed. TAKE  400 MG  TO 600 MG AS NEEDED  . losartan (COZAAR) 100 MG tablet Take 1 tablet (100 mg total) by mouth daily.  . montelukast (SINGULAIR) 10 MG tablet Take 10 mg by mouth at bedtime.  . ondansetron (ZOFRAN) 8 MG tablet Take 8 mg by mouth every 8 (eight) hours as needed for nausea or vomiting.   .  pantoprazole (PROTONIX) 40 MG tablet Take 40 mg by mouth daily.  . potassium chloride SA (K-DUR,KLOR-CON) 20 MEQ tablet Take 20 mEq by mouth daily.  . rosuvastatin (CRESTOR) 5 MG tablet Take 5 mg by mouth every Sunday.   . triamterene-hydrochlorothiazide (MAXZIDE-25) 37.5-25 MG per tablet Take 1 tablet by mouth daily.  . [DISCONTINUED] losartan (COZAAR) 100 MG tablet Take 1 tablet (100 mg total) by mouth daily. PLEASE KEEP UPCOMING APPOINTMENT FOR FURTHER REFILLS.     Allergies:   Erythromycin and Fosamax [alendronate]   Social History   Tobacco Use  . Smoking status: Never Smoker  . Smokeless tobacco: Never Used  Substance Use Topics  . Alcohol use: No  . Drug use: No     Family Hx: The patient's family history  includes Heart attack (age of onset: 43) in her brother; Heart disease in her mother.   Labs/Other Tests and Data Reviewed:    EKG:  No ECG reviewed.  Recent Labs: No results found for requested labs within last 8760 hours.   Recent Lipid Panel -- June 2020 (plan to recheck in Dec after change in diet) -> TC 189, TG 70, HDL 66, LDL 109 (up from 101).  A1c 5.8.  Wt Readings from Last 3 Encounters:  02/10/19 208 lb (94.3 kg)  10/06/17 168 lb 4 oz (76.3 kg)  10/02/16 210 lb (95.3 kg)     Objective:    Vital Signs:  Ht 5\' 2"  (1.575 m)   Wt 208 lb (94.3 kg)   BMI 38.04 kg/m   -- BPs @ PCP - borderline (thought to be related to increased wgt -- due for recheck in December). VITAL SIGNS:  reviewed GEN:  Well nourished, well developed female in no acute distress. Obese - lying in bed. RESPIRATORY:  normal respiratory effort, symmetric expansion NEURO:  alert and oriented x 3, no obvious focal deficit PSYCH:  Normal  Mood & Affect   ASSESSMENT & PLAN:    Problem List Items Addressed This Visit    Essential hypertension - Primary (Chronic)   Relevant Medications   losartan (COZAAR) 100 MG tablet   Obesity (BMI 30-39.9) (Chronic)   Hyperlipidemia due to dietary  fat intake (Chronic)   Relevant Medications   losartan (COZAAR) 100 MG tablet     Overall she seems to be doing fairly well from a cardiology standpoint.  No issues at all with chest pain or pressure.  Some deconditioning related dyspnea but no other cardiac symptoms. She still has labile weights, but it is actively trying to lose.  She is notably heavier than she had been during her last visit with me.  I encouraged her to get back on her exercise as well as diet regimen.  She is doing fairly well since starting back on weight watchers.  Her labs were just checked by her PCP in June with plans to recheck in December since her cholesterol level has gone up.  May potentially recommend increasing to 10 mg Crestor if not yet controlled with weight loss.  Blood pressures have been well controlled according to her when she is gone to her clinic visits.  Majority of the conversation was reassurance, discussing Covid issues and her concerns with weight change, diet exercise etc.  We reviewed her blood pressures and cholesterol levels which are currently being monitored closely by her PCP.  I reiterated the message from her PCP and comforted her with her attempts at dietary change and exercise.  I congratulated her weight loss and encouraged her to get back to where she was last year.  --  COVID-19 Education: The signs and symptoms of COVID-19 were discussed with the patient and how to seek care for testing (follow up with PCP or arrange E-visit).   The importance of social distancing was discussed today.  Time:   Today, I have spent 28 minutes with the patient with telehealth technology discussing the above problems.    Additional 4-5 min charting.   Medication Adjustments/Labs and Tests Ordered: Current medicines are reviewed at length with the patient today.  Concerns regarding medicines are outlined above.   Patient Instructions   Medication Instructions:  none  *If you need a refill on  your cardiac medications before your next appointment, please call your pharmacy*  Lab  Work: Per PCP   Testing/Procedures: none  Follow-Up: At [facility], you and your health needs are our priority.  As part of our continuing mission to provide you with exceptional heart care, we have created designated Provider Care Teams.  These Care Teams include your primary Cardiologist (physician) and Advanced Practice Providers (APPs -  Physician Assistants and Nurse Practitioners) who all work together to provide you with the care you need, when you need it.  Your next appointment:   9 months- Aug 2021  The format for your next appointment:   In Person  Provider:   Bryan Lemma, MD  Other Instructions Keep up the good work with with getting back to exercising & controlling your diet.      Signed, Bryan Lemma, MD  02/10/2019 5:24 PM    [facility]

## 2019-02-10 NOTE — Patient Instructions (Addendum)
  Medication Instructions:  none  *If you need a refill on your cardiac medications before your next appointment, please call your pharmacy*  Lab Work: Per PCP   Testing/Procedures: none  Follow-Up: At [facility], you and your health needs are our priority.  As part of our continuing mission to provide you with exceptional heart care, we have created designated Provider Care Teams.  These Care Teams include your primary Cardiologist (physician) and Advanced Practice Providers (APPs -  Physician Assistants and Nurse Practitioners) who all work together to provide you with the care you need, when you need it.  Your next appointment:   9 months- Aug 2021  The format for your next appointment:   In Person  Provider:   Glenetta Hew, MD  Other Instructions Keep up the good work with with getting back to exercising & controlling your diet.

## 2019-02-10 NOTE — Telephone Encounter (Signed)
SPOKE TO PATIENT. INSTRUCTION WAS GIVEN FROM TODAY'S VIRTUAL VISIT.  AVS SUMMARY HAS BEEN SENT VIA MYCHART.  FOLLOW UP APPT 9 MONTHS - AUG 2021.   PATIENT VERBALIZED UNDERSTANDING.

## 2019-03-17 ENCOUNTER — Other Ambulatory Visit: Payer: Self-pay

## 2019-03-17 DIAGNOSIS — Z20822 Contact with and (suspected) exposure to covid-19: Secondary | ICD-10-CM

## 2019-03-19 LAB — NOVEL CORONAVIRUS, NAA: SARS-CoV-2, NAA: NOT DETECTED

## 2019-04-09 DIAGNOSIS — A770 Spotted fever due to Rickettsia rickettsii: Secondary | ICD-10-CM

## 2019-04-09 HISTORY — PX: COLONOSCOPY: SHX174

## 2019-04-09 HISTORY — DX: Spotted fever due to Rickettsia rickettsii: A77.0

## 2019-05-17 DIAGNOSIS — H40013 Open angle with borderline findings, low risk, bilateral: Secondary | ICD-10-CM | POA: Diagnosis not present

## 2019-05-17 DIAGNOSIS — H25013 Cortical age-related cataract, bilateral: Secondary | ICD-10-CM | POA: Diagnosis not present

## 2019-05-17 DIAGNOSIS — H2513 Age-related nuclear cataract, bilateral: Secondary | ICD-10-CM | POA: Diagnosis not present

## 2019-05-17 DIAGNOSIS — H04123 Dry eye syndrome of bilateral lacrimal glands: Secondary | ICD-10-CM | POA: Diagnosis not present

## 2019-05-23 ENCOUNTER — Ambulatory Visit: Payer: Medicare PPO | Attending: Internal Medicine

## 2019-05-23 DIAGNOSIS — Z23 Encounter for immunization: Secondary | ICD-10-CM | POA: Insufficient documentation

## 2019-05-23 NOTE — Progress Notes (Signed)
   Covid-19 Vaccination Clinic  Name:  Sonia Beck    MRN: [id number] DOB: [birth year]  05/23/2019  [name] was observed post Covid-19 immunization for 30 minutes based on pre-vaccination screening without incidence. She was provided with Vaccine Information Sheet and instruction to access the V-Safe system.   [name] was instructed to call 911 with any severe reactions post vaccine: [name] Kitchen Difficulty breathing  . Swelling of your face and throat  . A fast heartbeat  . A bad rash all over your body  . Dizziness and weakness    Immunizations Administered    Name Date Dose VIS Date Route   Pfizer COVID-19 Vaccine 05/23/2019 11:21 AM 0.3 mL 03/19/2019 Intramuscular   Manufacturer: [employer], [employer]   Lot: [id number]   NDC: [address]-0

## 2019-06-15 ENCOUNTER — Ambulatory Visit: Payer: Medicare PPO | Attending: Internal Medicine

## 2019-06-15 DIAGNOSIS — Z23 Encounter for immunization: Secondary | ICD-10-CM | POA: Insufficient documentation

## 2019-06-15 NOTE — Progress Notes (Signed)
   Covid-19 Vaccination Clinic  Name:  Sonia Beck    MRN: [id number] DOB: July 06, 1945  06/15/2019  [name] was observed post Covid-19 immunization for 15 minutes without incident. She was provided with Vaccine Information Sheet and instruction to access the V-Safe system.   [name] was instructed to call 911 with any severe reactions post vaccine: [name] Kitchen Difficulty breathing  . Swelling of face and throat  . A fast heartbeat  . A bad rash all over body  . Dizziness and weakness   Immunizations Administered    Name Date Dose VIS Date Route   Pfizer COVID-19 Vaccine 06/15/2019  2:05 PM 0.3 mL 03/19/2019 Intramuscular   Manufacturer: [employer], [employer]   Lot: [id number]   NDC: [address]-7

## 2019-06-26 DIAGNOSIS — M1990 Unspecified osteoarthritis, unspecified site: Secondary | ICD-10-CM | POA: Diagnosis not present

## 2019-06-26 DIAGNOSIS — Z8249 Family history of ischemic heart disease and other diseases of the circulatory system: Secondary | ICD-10-CM | POA: Diagnosis not present

## 2019-06-26 DIAGNOSIS — Z881 Allergy status to other antibiotic agents status: Secondary | ICD-10-CM | POA: Diagnosis not present

## 2019-06-26 DIAGNOSIS — Z888 Allergy status to other drugs, medicaments and biological substances status: Secondary | ICD-10-CM | POA: Diagnosis not present

## 2019-06-26 DIAGNOSIS — E785 Hyperlipidemia, unspecified: Secondary | ICD-10-CM | POA: Diagnosis not present

## 2019-06-26 DIAGNOSIS — E669 Obesity, unspecified: Secondary | ICD-10-CM | POA: Diagnosis not present

## 2019-06-26 DIAGNOSIS — Z880 Allergy status to penicillin: Secondary | ICD-10-CM | POA: Diagnosis not present

## 2019-06-26 DIAGNOSIS — J45909 Unspecified asthma, uncomplicated: Secondary | ICD-10-CM | POA: Diagnosis not present

## 2019-06-26 DIAGNOSIS — K219 Gastro-esophageal reflux disease without esophagitis: Secondary | ICD-10-CM | POA: Diagnosis not present

## 2019-06-26 DIAGNOSIS — G8929 Other chronic pain: Secondary | ICD-10-CM | POA: Diagnosis not present

## 2019-06-26 DIAGNOSIS — I10 Essential (primary) hypertension: Secondary | ICD-10-CM | POA: Diagnosis not present

## 2019-06-26 DIAGNOSIS — F325 Major depressive disorder, single episode, in full remission: Secondary | ICD-10-CM | POA: Diagnosis not present

## 2019-06-26 DIAGNOSIS — F419 Anxiety disorder, unspecified: Secondary | ICD-10-CM | POA: Diagnosis not present

## 2019-08-13 ENCOUNTER — Other Ambulatory Visit: Payer: Self-pay

## 2019-08-13 ENCOUNTER — Ambulatory Visit (HOSPITAL_COMMUNITY)
Admission: EM | Admit: 2019-08-13 | Discharge: 2019-08-13 | Disposition: A | Discharge status: Home / Self Care | Payer: Medicare PPO | Attending: Emergency Medicine | Admitting: Emergency Medicine

## 2019-08-13 ENCOUNTER — Encounter (HOSPITAL_COMMUNITY): Payer: Self-pay

## 2019-08-13 DIAGNOSIS — Z7901 Long term (current) use of anticoagulants: Secondary | ICD-10-CM | POA: Insufficient documentation

## 2019-08-13 DIAGNOSIS — E669 Obesity, unspecified: Secondary | ICD-10-CM | POA: Insufficient documentation

## 2019-08-13 DIAGNOSIS — F419 Anxiety disorder, unspecified: Secondary | ICD-10-CM | POA: Diagnosis not present

## 2019-08-13 DIAGNOSIS — Z6830 Body mass index (BMI) 30.0-30.9, adult: Secondary | ICD-10-CM | POA: Insufficient documentation

## 2019-08-13 DIAGNOSIS — F329 Major depressive disorder, single episode, unspecified: Secondary | ICD-10-CM | POA: Diagnosis not present

## 2019-08-13 DIAGNOSIS — W57XXXA Bitten or stung by nonvenomous insect and other nonvenomous arthropods, initial encounter: Secondary | ICD-10-CM | POA: Diagnosis not present

## 2019-08-13 DIAGNOSIS — E785 Hyperlipidemia, unspecified: Secondary | ICD-10-CM | POA: Diagnosis not present

## 2019-08-13 DIAGNOSIS — Z79899 Other long term (current) drug therapy: Secondary | ICD-10-CM | POA: Insufficient documentation

## 2019-08-13 DIAGNOSIS — I10 Essential (primary) hypertension: Secondary | ICD-10-CM | POA: Diagnosis not present

## 2019-08-13 DIAGNOSIS — M25512 Pain in left shoulder: Secondary | ICD-10-CM | POA: Insufficient documentation

## 2019-08-13 DIAGNOSIS — L299 Pruritus, unspecified: Secondary | ICD-10-CM | POA: Diagnosis not present

## 2019-08-13 DIAGNOSIS — M25511 Pain in right shoulder: Secondary | ICD-10-CM | POA: Diagnosis not present

## 2019-08-13 MED ORDER — DOXYCYCLINE HYCLATE 100 MG PO CAPS: 100.0000 mg | ORAL_CAPSULE | Freq: Two times a day (BID) | ORAL | 0 refills | Status: AC

## 2019-08-13 NOTE — ED Provider Notes (Addendum)
HPI  SUBJECTIVE:  Sonia Beck is a 74 y.o. female who presents with a possible attached tick that she believes has been present for a month.  She states that it itches.  She reports bilateral arm/shoulder pain for the past month as well, but states it is present with certain movements only.  Is not present in any other part in time.  No rash, fevers, chest pain, shortness of breath, abdominal pain.  She denies other myalgias or new or different arthralgias.  She has a dog in the house.  She herself does not spend any time in the woods.  She denies flu symptoms.  She has a past medical history of hypertension, hypercholesterolemia, asthma, thrombocytopenia, murmur.  No history of diabetes, Lyme, [place] spotted fever.  LOV:FIEPPIRJJOAC, Campbell Lerner, MD   Past Medical History:  Diagnosis Date  . Anxiety   . Asthma   . Cardiac murmur Echo October 2013   Aortic sclerosis, not stenosis  . Depression   . Eczema   . Gout   . Hyperlipidemia   . Hypertension   . Obesity (BMI 30-39.9)    Former weight 224 pounds with BMI of 41 -- 65 pound weight loss in one year  . Thrombocytopenia (HCC)     Past Surgical History:  Procedure Laterality Date  . DOPPLER ECHOCARDIOGRAPHY  03/2006   ESSENTIALLY norm,andno valvular lesions despite hearing murmur  . DOPPLER ECHOCARDIOGRAPHY  01/16/2012   ef =>55% lv norm  . NM MYOCAR PERF WALL MOTION  01/30/2001   EF 69% ,LOW RISK,mild ant wall thinning due to breast attenuation    Family History  Problem Relation Age of Onset  . Heart disease Mother   . Heart attack Brother 32    Social History   Tobacco Use  . Smoking status: Never Smoker  . Smokeless tobacco: Never Used  Substance Use Topics  . Alcohol use: No  . Drug use: No    No current facility-administered medications for this encounter.  Current Outpatient Medications:  .  acetaminophen (TYLENOL) 500 MG tablet, Take 500 mg by mouth as needed. TAKE 2 TABLET AS NEEDED, Disp: , Rfl:    .  ALPRAZolam (XANAX) 0.25 MG tablet, Take 0.25 mg by mouth 2 (two) times daily as needed for anxiety. , Disp: , Rfl:  .  amLODipine (NORVASC) 10 MG tablet, Take 10 mg by mouth daily. , Disp: , Rfl:  .  calcium-vitamin D (OSCAL WITH D) 500-200 MG-UNIT per tablet, Take 1 tablet by mouth daily., Disp: , Rfl:  .  doxycycline (VIBRAMYCIN) 100 MG capsule, Take 1 capsule (100 mg total) by mouth 2 (two) times daily for 14 days., Disp: 28 capsule, Rfl: 0 .  FLUoxetine (PROZAC) 20 MG capsule, Take 20 mg by mouth daily., Disp: , Rfl:  .  ibuprofen (ADVIL) 200 MG tablet, Take 200 mg by mouth as needed. TAKE  400 MG  TO 600 MG AS NEEDED, Disp: , Rfl:  .  losartan (COZAAR) 100 MG tablet, Take 1 tablet (100 mg total) by mouth daily., Disp: 90 tablet, Rfl: 3 .  montelukast (SINGULAIR) 10 MG tablet, Take 10 mg by mouth at bedtime., Disp: , Rfl:  .  ondansetron (ZOFRAN) 8 MG tablet, Take 8 mg by mouth every 8 (eight) hours as needed for nausea or vomiting. , Disp: , Rfl:  .  pantoprazole (PROTONIX) 40 MG tablet, Take 40 mg by mouth daily., Disp: , Rfl:  .  potassium chloride SA (K-DUR,KLOR-CON) 20 MEQ tablet,  Take 20 mEq by mouth daily., Disp: , Rfl:  .  rosuvastatin (CRESTOR) 5 MG tablet, Take 5 mg by mouth every Sunday. , Disp: , Rfl:  .  triamterene-hydrochlorothiazide (MAXZIDE-25) 37.5-25 MG per tablet, Take 1 tablet by mouth daily., Disp: , Rfl:   Allergies  Allergen Reactions  . Erythromycin Other (See Comments)    Stomach cramps.  . Fosamax [Alendronate] Other (See Comments)    Joint pain.     ROS  As noted in HPI.   Physical Exam  BP (!) 155/74 (BP Location: Right Arm)   Pulse 77   Temp 98.8 F (37.1 C) (Oral)   Resp 16   Wt 90.7 kg   SpO2 100%   BMI 36.58 kg/m   Constitutional: Well developed, well nourished, no acute distress Eyes:  EOMI, conjunctiva normal bilaterally HENT: Normocephalic, atraumatic,mucus membranes moist Respiratory: Normal inspiratory effort Cardiovascular:  Normal rate regular rhythm.  Positive murmur. GI: nondistended skin: Positive embedded slightly engorged tick right breast no surrounding bull's-eye rash.  No rash on the palms of the hands.  No rash on the torso.      Musculoskeletal: no deformities Neurologic: Alert & oriented x 3, no focal neuro deficits Psychiatric: Speech and behavior appropriate   ED Course   Medications - No data to display  No orders of the defined types were placed in this encounter.   No results found for this or any previous visit (from the past 24 hour(s)). No results found.  ED Clinical Impression  1. Itching with irritation   2. Tick bite with subsequent removal of tick      ED Assessment/Plan  Procedure note: Cleaned area with alcohol.  Removed tick with tweezers.  Removed embedded in mouthparts in their entirety with a 18-gauge needle.  Placed bacitracin and a Band-Aid.  Patient tolerated procedure well.  Patient with a tick bite.  Unknown duration of attachment.  She is reporting shoulder aches, but no other flulike symptoms.  Will send off [facility] spotted fever and Lyme titers, home with 14 days of doxycycline.  Discussed with patient that her for Lyme antibodies come back positive she will need to extend the doxycycline out for 1 more week.  Patient gave [place] a working phone number.  Local wound care for the tick bite.  Discussed labs, MDM, treatment plan, and plan for follow-up with patient. Discussed sn/sx that should prompt return to the ED. patient agrees with plan.   Meds ordered this encounter  Medications  . doxycycline (VIBRAMYCIN) 100 MG capsule    Sig: Take 1 capsule (100 mg total) by mouth 2 (two) times daily for 14 days.    Dispense:  28 capsule    Refill:  0    *This clinic note was created using [employer]. Therefore, there may be occasional mistakes despite careful proofreading.   ?    Melynda Ripple, MD 08/15/19 1154    Melynda Ripple,  MD 08/15/19 [phone]

## 2019-08-13 NOTE — Discharge Instructions (Addendum)
Finish the doxycycline unless a provider tells you to stop.  You will need for follow-up with your doctor in 2 weeks if your Lyme titers come back positive, you will need to have the Doxy cycling extended for an additional week.  Local wound care with soap and water, bacitracin.  Go to the ED for severe headache, chest pain, shortness of breath, if you develop a rash on the palms of your hands, soles of your feet, or for any other concerns

## 2019-08-13 NOTE — ED Triage Notes (Signed)
Pt states she has a tick on her right breast. Pt states its been there for a month . Pt states her shoulders have been hurting in the joint.

## 2019-08-14 ENCOUNTER — Encounter: Payer: Self-pay | Admitting: Emergency Medicine

## 2019-08-14 ENCOUNTER — Ambulatory Visit
Admission: EM | Admit: 2019-08-14 | Discharge: 2019-08-14 | Disposition: A | Discharge status: Home / Self Care | Payer: Medicare PPO

## 2019-08-14 ENCOUNTER — Other Ambulatory Visit: Payer: Self-pay

## 2019-08-14 DIAGNOSIS — W57XXXA Bitten or stung by nonvenomous insect and other nonvenomous arthropods, initial encounter: Secondary | ICD-10-CM | POA: Diagnosis not present

## 2019-08-14 DIAGNOSIS — I160 Hypertensive urgency: Secondary | ICD-10-CM | POA: Diagnosis not present

## 2019-08-14 DIAGNOSIS — S70361A Insect bite (nonvenomous), right thigh, initial encounter: Secondary | ICD-10-CM | POA: Diagnosis not present

## 2019-08-14 LAB — B. BURGDORFI ANTIBODIES: B burgdorferi Ab IgG+IgM: <0.91 {ISR} (ref 0.00–0.90)

## 2019-08-14 NOTE — ED Triage Notes (Signed)
Patient seen yesterday for tick removal.  Patient is here today for possible tick located right inner thigh

## 2019-08-14 NOTE — Discharge Instructions (Addendum)
Take successfully removed. Important pick up your doxycycline: Take with breakfast and dinner every day for the next 2 weeks. Blood work will results on Pharmacologist. Important to follow-up with your primary care via phone Monday to let them know about your urgent care visits. Return for rash, pain, fever.

## 2019-08-14 NOTE — ED Provider Notes (Signed)
[facility]    CSN: [id number] Arrival date & time: 08/14/19  0808      History   Chief Complaint Chief Complaint  Patient presents with  . Tick Removal    HPI Sonia Beck is a 74 y.o. female with history of obesity, hypertension, eczema, asthma presenting for tick bite.  Patient evaluated for tick bite yesterday: Please see those notes which were reviewed by me.  Tick was successfully removed from right breast and patient was given 14-day course of doxycycline.  RSMF, Lyme panel is pending.  Patient concerned that she may have another tick in her groin fold.  States she was able to pick up her doxycycline, began last night, and is tolerated well.    Past Medical History:  Diagnosis Date  . Anxiety   . Asthma   . Cardiac murmur Echo October 2013   Aortic sclerosis, not stenosis  . Depression   . Eczema   . Gout   . Hyperlipidemia   . Hypertension   . Obesity (BMI 30-39.9)    Former weight 224 pounds with BMI of 41 -- 65 pound weight loss in one year  . Thrombocytopenia [facility])     Patient Active Problem List   Diagnosis Date Noted  . Family history of abdominal aortic aneurysm (AAA) 10/02/2016  . Obesity (BMI 30-39.9) 11/13/2012  . Cardiac murmur   . Essential hypertension   . Hyperlipidemia due to dietary fat intake     Past Surgical History:  Procedure Laterality Date  . DOPPLER ECHOCARDIOGRAPHY  03/2006   ESSENTIALLY norm,andno valvular lesions despite hearing murmur  . DOPPLER ECHOCARDIOGRAPHY  01/16/2012   ef =>55% lv norm  . NM MYOCAR PERF WALL MOTION  01/30/2001   EF 69% ,LOW RISK,mild ant wall thinning due to breast attenuation    OB History   No obstetric history on file.      Home Medications    Prior to Admission medications   Medication Sig Start Date End Date Taking? Authorizing Provider  triamterene-hydrochlorothiazide (MAXZIDE-25) 37.5-25 MG per tablet Take 1 tablet by mouth daily.   Yes [provider]    acetaminophen (TYLENOL) 500 MG tablet Take 500 mg by mouth as needed. TAKE 2 TABLET AS NEEDED    [provider]  ALPRAZolam (XANAX) 0.25 MG tablet Take 0.25 mg by mouth 2 (two) times daily as needed for anxiety.     [provider]  amLODipine (NORVASC) 10 MG tablet Take 10 mg by mouth daily.  11/01/14   [provider]  calcium-vitamin D (OSCAL WITH D) 500-200 MG-UNIT per tablet Take 1 tablet by mouth daily.    [provider]  doxycycline (VIBRAMYCIN) 100 MG capsule Take 1 capsule (100 mg total) by mouth 2 (two) times daily for 14 days. 08/13/19 08/27/19  Domenick Gong, MD  FLUoxetine (PROZAC) 20 MG capsule Take 20 mg by mouth daily.    [provider]  ibuprofen (ADVIL) 200 MG tablet Take 200 mg by mouth as needed. TAKE  400 MG  TO 600 MG AS NEEDED    [provider]  losartan (COZAAR) 100 MG tablet Take 1 tablet (100 mg total) by mouth daily. 02/10/19   Marykay Lex, MD  montelukast (SINGULAIR) 10 MG tablet Take 10 mg by mouth at bedtime.    [provider]  ondansetron (ZOFRAN) 8 MG tablet Take 8 mg by mouth every 8 (eight) hours as needed for nausea or vomiting.  08/15/14  [provider]  pantoprazole (PROTONIX) 40 MG tablet Take 40 mg by mouth daily.    [provider]  potassium chloride SA (K-DUR,KLOR-CON) 20 MEQ tablet Take 20 mEq by mouth daily.    [provider]  rosuvastatin (CRESTOR) 5 MG tablet Take 5 mg by mouth every Sunday.     [provider]    Family History Family History  Problem Relation Age of Onset  . Heart disease Mother   . Heart attack Brother 69    Social History Social History   Tobacco Use  . Smoking status: Never Smoker  . Smokeless tobacco: Never Used  Substance Use Topics  . Alcohol use: No  . Drug use: No     Allergies   Erythromycin and Fosamax [alendronate]   Review of Systems As per HPI   Physical Exam Triage Vital Signs ED Triage  Vitals  Enc Vitals Group     BP      Pulse      Resp      Temp      Temp src      SpO2      Weight      Height      Head Circumference      Peak Flow      Pain Score      Pain Loc      Pain Edu?      Excl. in [place]?    No data found.  Updated Vital Signs BP (!) 193/93 (BP Location: Left Arm)   Pulse 77   Temp 98.3 F (36.8 C)   Resp 18   SpO2 96%   Visual Acuity Right Eye Distance:   Left Eye Distance:   Bilateral Distance:    Right Eye Near:   Left Eye Near:    Bilateral Near:     Physical Exam Constitutional:      General: She is not in acute distress. HENT:     Head: Normocephalic and atraumatic.  Eyes:     General: No scleral icterus.    Pupils: Pupils are equal, round, and reactive to light.  Cardiovascular:     Rate and Rhythm: Normal rate.  Pulmonary:     Effort: Pulmonary effort is normal.  Skin:    Coloration: Skin is not jaundiced or pale.     Comments: Fall head-toe skin exam significant for embedded tick in right upper medial thigh.  Spares groin fold.  No surrounding erythema, warmth, tenderness.    Neurological:     Mental Status: She is alert and oriented to person, place, and time.          UC Treatments / Results  Labs (all labs ordered are listed, but only abnormal results are displayed) Labs Reviewed - No data to display  EKG   Radiology No results found.  Procedures Procedures (including critical care time)  Medications Ordered in UC Medications - No data to display  Initial Impression / Assessment and Plan / UC Course  I have reviewed the triage vital signs and the nursing notes.  Pertinent labs & imaging results that were available during my care of the patient were reviewed by me and considered in my medical decision making (see chart for details).     Patient afebrile, nontoxic in office today.  Patient is hypertensive, though without systemic symptoms.  Attributes this to being nervous and taking BP medication  before appointment.  Blood pressure did decrease slightly prior to discharge.  Continue follow-up with PCP, cardiology for further management.  Complete head-toe skin exam performed: embedded tick in right upper medial thigh.  Successfully removed in office.  Lyme, RSMF still pending.  Patient will pick up doxycycline this morning: Reviewed understanding of antibiotic regimen and follow-up with PCP.  Return precautions discussed, patient verbalized understanding and is agreeable to plan. Final Clinical Impressions(s) / UC Diagnoses   Final diagnoses:  Tick bite of right thigh, initial encounter  Hypertensive urgency     Discharge Instructions     Take successfully removed. Important pick up your doxycycline: Take with breakfast and dinner every day for the next 2 weeks. Blood work will results on Pharmacologist. Important to follow-up with your primary care via phone Monday to let them know about your urgent care visits. Return for rash, pain, fever.    ED Prescriptions    None     PDMP not reviewed this encounter.   Hall[place]vin, [place], [place] 08/14/19 1052

## 2019-08-17 LAB — ROCKY MTN SPOTTED FVR ABS PNL(IGG+IGM)
RMSF IgG: POSITIVE — AB
RMSF IgM: 0.43 index (ref 0.00–0.89)

## 2019-08-17 LAB — RMSF, IGG, IFA: RMSF, IGG, IFA: 1:64 {titer} — ABNORMAL HIGH

## 2019-09-21 DIAGNOSIS — M25519 Pain in unspecified shoulder: Secondary | ICD-10-CM | POA: Diagnosis not present

## 2019-09-21 DIAGNOSIS — M25561 Pain in right knee: Secondary | ICD-10-CM | POA: Diagnosis not present

## 2019-09-21 DIAGNOSIS — Z8619 Personal history of other infectious and parasitic diseases: Secondary | ICD-10-CM | POA: Diagnosis not present

## 2019-09-23 DIAGNOSIS — M25511 Pain in right shoulder: Secondary | ICD-10-CM | POA: Diagnosis not present

## 2019-09-23 DIAGNOSIS — M7581 Other shoulder lesions, right shoulder: Secondary | ICD-10-CM | POA: Diagnosis not present

## 2019-09-23 DIAGNOSIS — M25561 Pain in right knee: Secondary | ICD-10-CM | POA: Diagnosis not present

## 2019-09-23 DIAGNOSIS — M7582 Other shoulder lesions, left shoulder: Secondary | ICD-10-CM | POA: Diagnosis not present

## 2019-09-23 DIAGNOSIS — M1990 Unspecified osteoarthritis, unspecified site: Secondary | ICD-10-CM | POA: Diagnosis not present

## 2019-09-23 DIAGNOSIS — M25512 Pain in left shoulder: Secondary | ICD-10-CM | POA: Diagnosis not present

## 2019-09-23 DIAGNOSIS — M25562 Pain in left knee: Secondary | ICD-10-CM | POA: Diagnosis not present

## 2019-09-23 DIAGNOSIS — M353 Polymyalgia rheumatica: Secondary | ICD-10-CM | POA: Diagnosis not present

## 2019-09-23 LAB — CBC AND DIFFERENTIAL
HCT: 38 (ref 36–46)
Hemoglobin: 12.2 (ref 12.0–16.0)
Platelets: 446 — AB (ref 150–399)
WBC: 7.8

## 2019-09-23 LAB — HEPATIC FUNCTION PANEL
ALT: 18 (ref 7–35)
AST: 18 (ref 13–35)
Alkaline Phosphatase: 103 (ref 25–125)
Bilirubin, Total: 0.5

## 2019-09-23 LAB — COMPREHENSIVE METABOLIC PANEL
Albumin: 4 (ref 3.5–5.0)
Calcium: 9.6 (ref 8.7–10.7)
GFR calc Af Amer: 78.89
GFR calc non Af Amer: 65.2
Globulin: 4.5

## 2019-09-23 LAB — CBC: RBC: 4.87 (ref 3.87–5.11)

## 2019-09-23 LAB — BASIC METABOLIC PANEL
BUN: 16 (ref 4–21)
CO2: 30 — AB (ref 13–22)
Chloride: 102 (ref 99–108)
Creatinine: 0.9 (ref 0.5–1.1)
Glucose: 92
Potassium: 4 (ref 3.4–5.3)
Sodium: 139 (ref 137–147)

## 2019-09-23 LAB — POCT ERYTHROCYTE SEDIMENTATION RATE, NON-AUTOMATED: Sed Rate: 20

## 2019-10-14 DIAGNOSIS — M7582 Other shoulder lesions, left shoulder: Secondary | ICD-10-CM | POA: Diagnosis not present

## 2019-10-14 DIAGNOSIS — M1990 Unspecified osteoarthritis, unspecified site: Secondary | ICD-10-CM | POA: Diagnosis not present

## 2019-10-14 DIAGNOSIS — M7581 Other shoulder lesions, right shoulder: Secondary | ICD-10-CM | POA: Diagnosis not present

## 2019-10-14 DIAGNOSIS — M25511 Pain in right shoulder: Secondary | ICD-10-CM | POA: Diagnosis not present

## 2019-10-14 DIAGNOSIS — M25561 Pain in right knee: Secondary | ICD-10-CM | POA: Diagnosis not present

## 2019-10-18 DIAGNOSIS — N182 Chronic kidney disease, stage 2 (mild): Secondary | ICD-10-CM | POA: Diagnosis not present

## 2019-10-18 DIAGNOSIS — R5383 Other fatigue: Secondary | ICD-10-CM | POA: Diagnosis not present

## 2019-10-18 DIAGNOSIS — I10 Essential (primary) hypertension: Secondary | ICD-10-CM | POA: Diagnosis not present

## 2019-10-18 DIAGNOSIS — E785 Hyperlipidemia, unspecified: Secondary | ICD-10-CM | POA: Diagnosis not present

## 2019-10-18 LAB — HEPATIC FUNCTION PANEL
ALT: 18 (ref 7–35)
AST: 16 (ref 13–35)
Alkaline Phosphatase: 98 (ref 25–125)
Bilirubin, Total: 0.5

## 2019-10-18 LAB — LIPID PANEL
Cholesterol: 191 (ref 0–200)
HDL: 58 (ref 35–70)
LDL Cholesterol: 118
LDl/HDL Ratio: 133
LDl/HDL Ratio: 2
Triglycerides: 76 (ref 40–160)

## 2019-10-18 LAB — BASIC METABOLIC PANEL
BUN: 12 (ref 4–21)
CO2: 29 — AB (ref 13–22)
Chloride: 102 (ref 99–108)
Creatinine: 0.9 (ref 0.5–1.1)
Glucose: 100
Potassium: 4 (ref 3.4–5.3)
Sodium: 143 (ref 137–147)

## 2019-10-18 LAB — COMPREHENSIVE METABOLIC PANEL
Albumin: 3.7 (ref 3.5–5.0)
Calcium: 9.2 (ref 8.7–10.7)
GFR calc Af Amer: 78.88
GFR calc non Af Amer: 65.19
Globulin: 4.3

## 2019-10-18 LAB — CBC AND DIFFERENTIAL
HCT: 37 (ref 36–46)
Hemoglobin: 12.3 (ref 12.0–16.0)
Platelets: 410 — AB (ref 150–399)
WBC: 6.6

## 2019-10-18 LAB — CBC: RBC: 4.85 (ref 3.87–5.11)

## 2019-10-18 LAB — TSH: TSH: 0.76 (ref 0.41–5.90)

## 2019-10-25 DIAGNOSIS — K219 Gastro-esophageal reflux disease without esophagitis: Secondary | ICD-10-CM | POA: Diagnosis not present

## 2019-10-25 DIAGNOSIS — E782 Mixed hyperlipidemia: Secondary | ICD-10-CM | POA: Diagnosis not present

## 2019-10-25 DIAGNOSIS — N182 Chronic kidney disease, stage 2 (mild): Secondary | ICD-10-CM | POA: Diagnosis not present

## 2019-10-25 DIAGNOSIS — I10 Essential (primary) hypertension: Secondary | ICD-10-CM | POA: Diagnosis not present

## 2019-10-25 DIAGNOSIS — R7309 Other abnormal glucose: Secondary | ICD-10-CM | POA: Diagnosis not present

## 2019-10-25 DIAGNOSIS — F3340 Major depressive disorder, recurrent, in remission, unspecified: Secondary | ICD-10-CM | POA: Diagnosis not present

## 2019-10-27 DIAGNOSIS — Z1211 Encounter for screening for malignant neoplasm of colon: Secondary | ICD-10-CM | POA: Diagnosis not present

## 2019-10-27 DIAGNOSIS — Z9229 Personal history of other drug therapy: Secondary | ICD-10-CM | POA: Diagnosis not present

## 2019-11-08 ENCOUNTER — Other Ambulatory Visit: Payer: Self-pay

## 2019-11-08 ENCOUNTER — Encounter: Payer: Self-pay | Admitting: Cardiology

## 2019-11-08 ENCOUNTER — Ambulatory Visit: Payer: Medicare PPO | Admitting: Cardiology

## 2019-11-08 VITALS — BP 152/84 | HR 71 | Ht 62.0 in | Wt 207.6 lb

## 2019-11-08 DIAGNOSIS — I10 Essential (primary) hypertension: Secondary | ICD-10-CM | POA: Diagnosis not present

## 2019-11-08 DIAGNOSIS — Z8249 Family history of ischemic heart disease and other diseases of the circulatory system: Secondary | ICD-10-CM

## 2019-11-08 DIAGNOSIS — R011 Cardiac murmur, unspecified: Secondary | ICD-10-CM

## 2019-11-08 DIAGNOSIS — E7849 Other hyperlipidemia: Secondary | ICD-10-CM

## 2019-11-08 NOTE — Patient Instructions (Signed)
Medication Instructions:  No changes *If you need a refill on your cardiac medications before your next appointment, please call your pharmacy*   Lab Work: Not needed    Testing/Procedures: Not needed   Follow-Up: At [facility], you and your health needs are our priority.  As part of our continuing mission to provide you with exceptional heart care, we have created designated Provider Care Teams.  These Care Teams include your primary Cardiologist (physician) and Advanced Practice Providers (APPs -  Physician Assistants and Nurse Practitioners) who all work together to provide you with the care you need, when you need it.    Your next appointment:   12 month(s)  The format for your next appointment:   In Person  Provider:   Bryan Lemma, MD   Other Instructions Please purchase a blood pressure  Monitoring/cuff.   Check your blood pressure before and after exercise  During the  Week- 2 to 3 times a week. Keep recording - the arrange   Should be 130 -140 systolic ( top number )

## 2019-11-08 NOTE — Progress Notes (Signed)
Primary Care Provider: Georgianne Fick, MD Cardiologist: Bryan Lemma, MD Electrophysiologist: None  Clinic Note: Chief Complaint  Patient presents with  . Hospitalization Follow-up    Urgent care visit-hypertensive urgency with a quite  . Follow-up    Hypertension    HPI:    Sonia Beck is a 74 y.o. female with a PMH hypertension, obesity, and hyperlipidemia who presents today for hospital follow-up after ER visit for hypertensive urgency.  She also needs preprocedural clearance for her 10-year colonoscopy.  Sonia Beck was last seen on February 10, 2019 via telemedicine.  At that time she was trying to use weight watchers.  She gained quite a bit of weight (abdomen 17 pounds) during COVID-19 lockdown, has lost 9 LB by the time I saw her back in 208 LB-> prior to the COVID-19 lockdown she was exercising at the [facility], but was not able to do so because of COVID-19 restrictions.  She was trying to do exercises at home and more housework.  Noted a little exertional dyspnea with weight gain, but improving...  Recent Hospitalizations:   08/14/2019-URGENT CARE VISIT -> for tick bite.  Tick was removed.  Given 14-day course of doxycycline.  RMSF and Lyme valve check.. ->  She was noted to have severe hypertension time-blood pressure is 193/93 mmHg.  Was very anxious and nervous about her tick bite.  Blood pressure slightly improved on discharge.  groing & armpit ticks -- ? RMSF+ (thinks they were from her dog)  Also Dx with OA R Knee & foot bone spurs -- does limit walking -- had short course of prednisone -- helped all of her pains. --? Dx of PMR  Reviewed  CV studies:    The following studies were reviewed today: (if available, images/films reviewed: From Epic Chart or Care Everywhere) . None:  Interval History:   Sonia Beck returns here today in good spirits.  She has a game plan again as usual for what she is going to with her diet and exercise.  She says that she  wants to just go away from the monitoring of calories and having to go to meetings etc.  She does not want fad diets she wants to simply try to do a combination of the DASH diet and the anti-inflammatory diet that was given to her by her rheumatologist.  She thinks that been doing this and doing more of her exercise, her symptoms will improve.  She is now doing her exercise classes at the [facility] but also at the senior center.  Part of the time she is doing water aerobics part of time she is doing chair aerobics.   She seems to think that the issue with her tick bite and the blood pressure being high at all had to do with the fact that she was totally stressed out about him to go to the emergency room.  She says her blood pressures are not usually that high.  She blurred vision. She is not having any chest pain or pressure with rest exertion.  No heart failure symptoms of PND, orthopnea or edema.  No TIA versus DX symptoms.  No signs symptoms of palpitations.   The patient does not have symptoms concerning for COVID-19 infection (fever, chills, cough, or new shortness of breath).  The patient is practicing social distancing & Masking.   Fully Vaccinated (along with "adopted son")  REVIEWED OF SYSTEMS   Review of Systems  Constitutional: Positive for weight loss (She has noted  her weight go away up and is now back on its way down.  Weights listed do not indicate that.). Negative for malaise/fatigue. Diaphoresis: She is making a conscientious effort to try to get exercise.  HENT: Negative for ear discharge and nosebleeds.   Respiratory: Positive for shortness of breath (Intermittently with exertion). Negative for cough.   Cardiovascular: Positive for leg swelling (Relatively stable).  Gastrointestinal: Negative for blood in stool, constipation and melena.  Genitourinary: Negative for frequency and hematuria.  Musculoskeletal: Positive for joint pain, myalgias and neck pain.       She has bilateral  shoulder pain worse with most movements.  Also her hips and knees bother her.--There is suggestion that maybe she has polymyalgia rheumatica.  Symptoms were notably improved with pulse dose of steroids.  Neurological: Positive for dizziness (Sometimes with change in position but not usually.) and seizures. Negative for focal weakness, weakness and headaches (Not even with elevated blood pressures).  Psychiatric/Behavioral: Negative for memory loss. The patient is not nervous/anxious and does not have insomnia.    I have reviewed and (if needed) personally updated the patient's problem list, medications, allergies, past medical and surgical history, social and family history.   PAST MEDICAL HISTORY   Past Medical History:  Diagnosis Date  . Anxiety   . Asthma   . Cardiac murmur Echo October 2013   Aortic sclerosis, not stenosis  . Depression   . Eczema   . Gout   . Hyperlipidemia   . Hypertension   . Obesity (BMI 30-39.9)    Former weight 224 pounds with BMI of 41 -- 65 pound weight loss in one year  . Thrombocytopenia (HCC)     PAST SURGICAL HISTORY   Past Surgical History:  Procedure Laterality Date  . DOPPLER ECHOCARDIOGRAPHY  10/2016   mild LVH.  Focal basal.  EF 55 to 60%.  No R WMA.  GR 1 DD.  Mildly elevated PA pressures of 33 mmHg.  No obvious valve lesion to explain murmur  . DOPPLER ECHOCARDIOGRAPHY  01/16/2012   ef =>55% lv norm  . NM MYOCAR PERF WALL MOTION  01/30/2001   EF 69% ,LOW RISK,mild ant wall thinning due to breast attenuation    MEDICATIONS/ALLERGIES   Current Meds  Medication Sig  . acetaminophen (TYLENOL) 500 MG tablet Take 500 mg by mouth as needed. TAKE 2 TABLET AS NEEDED  . ALPRAZolam (XANAX) 0.25 MG tablet Take 0.25 mg by mouth 2 (two) times daily as needed for anxiety.   [name] Kitchen amLODipine (NORVASC) 10 MG tablet Take 10 mg by mouth daily.   . calcium-vitamin D (OSCAL WITH D) 500-200 MG-UNIT per tablet Take 1 tablet by mouth daily.  [name] Kitchen FLUoxetine  (PROZAC) 20 MG capsule Take 20 mg by mouth daily.  [name] Kitchen ibuprofen (ADVIL) 200 MG tablet Take 200 mg by mouth as needed. TAKE  400 MG  TO 600 MG AS NEEDED  . losartan (COZAAR) 100 MG tablet Take 1 tablet (100 mg total) by mouth daily.  . montelukast (SINGULAIR) 10 MG tablet Take 10 mg by mouth at bedtime.  . ondansetron (ZOFRAN) 8 MG tablet Take 8 mg by mouth every 8 (eight) hours as needed for nausea or vomiting.   . potassium chloride SA (K-DUR,KLOR-CON) 20 MEQ tablet Take 20 mEq by mouth daily.  . rosuvastatin (CRESTOR) 5 MG tablet Take 5 mg by mouth every Sunday.   . triamterene-hydrochlorothiazide (MAXZIDE-25) 37.5-25 MG per tablet Take 1 tablet by mouth daily.  . [DISCONTINUED] pantoprazole (  PROTONIX) 40 MG tablet Take 40 mg by mouth daily.    Allergies  Allergen Reactions  . Erythromycin Other (See Comments)    Stomach cramps.  . Fosamax [Alendronate] Other (See Comments)    Joint pain.    SOCIAL HISTORY/FAMILY HISTORY   Reviewed in Epic:  Pertinent findings: She adopted her now adopted son's mother - who had 5 sons from 5 fathers -- she adopted the oldest. (he has Sickle Sickle Cell) -- Diet plan -- decided to do "what she can live with:" -- [facility] can't go to meetings & too hard to count calories.  Plan to follow mostly DASH diet.  With alterations based upon an "Anti-Inflammatory" diet. Signed up for OA class; personal trainer & signed up for [employer] Aerobics - OA class (both water @ [employer] & chair exercises [facility])   OBJCTIVE -PE, EKG, labs   Wt Readings from Last 3 Encounters:  11/08/19 (!) 207 lb 9.6 oz (94.2 kg)  08/13/19 200 lb (90.7 kg)  02/10/19 208 lb (94.3 kg)    Physical Exam: BP (!) 152/84   Pulse 71   Ht 5\' 2"  (1.575 m)   Wt (!) 207 lb 9.6 oz (94.2 kg)   SpO2 100%   BMI 37.97 kg/m  Physical Exam Constitutional:      General: She is in acute distress.     Appearance: Normal appearance. She is not toxic-appearing or diaphoretic.  HENT:      Head: Normocephalic and atraumatic.  Neck:     Vascular: No carotid bruit.  Cardiovascular:     Rate and Rhythm: Normal rate and regular rhythm.     Pulses: Normal pulses.     Heart sounds: Murmur (1/6 SEM at RUSB) heard.  No gallop.   Pulmonary:     Effort: Pulmonary effort is normal. No respiratory distress.  Chest:     Chest wall: Tenderness present.  Abdominal:     General: Abdomen is flat. Bowel sounds are normal. There is no distension.  Musculoskeletal:        General: Swelling (Trivial bilateral LE) present. Normal range of motion.     Cervical back: No rigidity (But stiff).  Neurological:     General: No focal deficit present.     Mental Status: She is alert and oriented to person, place, and time.  Psychiatric:        Mood and Affect: Mood normal.        Behavior: Behavior normal.        Thought Content: Thought content normal.        Judgment: Judgment normal.       Adult ECG Report  Rate: 71 ;  Rhythm: normal sinus rhythm and normal axis, intervals & durations.;   Narrative Interpretation: stable - normal  Recent Labs: LDL from October 18, 2019 was 118, HDL 58. No results found for: CHOL, HDL, LDLCALC, LDLDIRECT, TRIG, CHOLHDL Lab Results  Component Value Date   CREATININE 0.71 01/14/2016   BUN 11 01/14/2016   NA 136 01/14/2016   K 2.8 (L) 01/14/2016   CL 98 (L) 01/14/2016   CO2 26 01/14/2016   No results found for: TSH  ASSESSMENT/PLAN    Majority of this visit was really a social visit for her to discuss with her generalized health and fitness plan along with diet.  Had not seen her in person, and she had many questions.  We spent quite a bit of time together.  Problem List Items Addressed This Visit    Essential hypertension - Primary (Chronic)    Blood pressure is still a little high.  I want to see what her pressures are at home.  She tells me that every time she is going to get an evaluation, she gets nervous and her pressures go up.  I have asked  that she try to get her blood pressures drawn when she goes to the [facility] for her classes.  Is usually given be relatively improved at that time.  If pressures are still high, I think we probably will need to add a new medicine or switch from losartan to a newer ARB.      Relevant Orders   EKG 12-Lead (Completed)   Cardiac murmur (Chronic)    Stable murmur on exam, but nothing noted on echo.  At this point probably not worse to follow-up on.      Relevant Orders   EKG 12-Lead (Completed)   Hyperlipidemia due to dietary fat intake (Chronic)    LDL is not at goal.  Hopefully with weight loss, this will improve.  She is only on 5 mg of Crestor every Sunday. We can reassess him to see her back, but goal LDL should be less than 100.  Low threshold to consider increasing to least 2 doses per week or increasing dose to 10 mg.  Otherwise we will probably need to consider some leg Zetia.      Family history of abdominal aortic aneurysm (AAA) (Chronic)    Has been evaluated not that long ago.  Can probably wait 5 years from last evaluation      Relevant Orders   EKG 12-Lead (Completed)   Morbid obesity (HCC)    She has another game plan for what to do for weight loss.  Hopefully this time around she will actually able to keep the weight off.  This will help her blood pressure and lipids.  Unfortunately, she is limited as far as exercise because of her arthritis pains and probably PMR.  I encouraged her to continue doing the water aerobics in the chair aerobics as those are good ways of doing exercises without straining the joints too much.           COVID-19 Education: The signs and symptoms of COVID-19 were discussed with the patient and how to seek care for testing (follow up with PCP or arrange E-visit).   The importance of social distancing and COVID-19 vaccination was discussed today.  I spent a total of 32 minutes with the patient. >  50% of the time was spent in direct patient  consultation.  Sonia Beck was in a very talkative mood today.  She very much wanted to get my approval on her diet plan and her exercise plan.  She wanted to reiterate her symptoms when she went to the emergency room.  She feels like her blood pressures are usually better than they are now.  She talked about how she want to avoid extra medications for now.  I have explained to her that we really do not have any of her room to titrate medications further and would have to add any medicine. Additional time spent with chart review  / charting (studies, outside notes, etc): 10 Total Time: 42 min   Current medicines are reviewed at length with the patient today.  (+/- concerns) n/a  Notice: This dictation was prepared with Dragon dictation along with smaller phrase technology. Any transcriptional errors that result  from this process are unintentional and may not be corrected upon review.  Patient Instructions / Medication Changes & Studies & Tests Ordered   Patient Instructions  Medication Instructions:  No changes *If you need a refill on your cardiac medications before your next appointment, please call your pharmacy*   Lab Work: Not needed    Testing/Procedures: Not needed   Follow-Up: At [facility], you and your health needs are our priority.  As part of our continuing mission to provide you with exceptional heart care, we have created designated Provider Care Teams.  These Care Teams include your primary Cardiologist (physician) and Advanced Practice Providers (APPs -  Physician Assistants and Nurse Practitioners) who all work together to provide you with the care you need, when you need it.    Your next appointment:   12 month(s)  The format for your next appointment:   In Person  Provider:   Bryan Lemma, MD   Other Instructions Please purchase a blood pressure  Monitoring/cuff.   Check your blood pressure before and after exercise  During the  Week- 2 to 3 times a  week. Keep recording - the arrange   Should be 130 -140 systolic ( top number )     Studies Ordered:   Orders Placed This Encounter  Procedures  . EKG 12-Lead     Bryan Lemma, M.D., M.S. Interventional Cardiologist   Pager # [phone] Phone # [phone] [address] [place], [place] [address]   Thank you for choosing Heartcare at [facility]!!

## 2019-11-11 ENCOUNTER — Encounter: Payer: Self-pay | Admitting: Cardiology

## 2019-11-11 NOTE — Assessment & Plan Note (Signed)
Stable murmur on exam, but nothing noted on echo.  At this point probably not worse to follow-up on.

## 2019-11-11 NOTE — Assessment & Plan Note (Signed)
LDL is not at goal.  Hopefully with weight loss, this will improve.  She is only on 5 mg of Crestor every Sunday. We can reassess him to see her back, but goal LDL should be less than 100.  Low threshold to consider increasing to least 2 doses per week or increasing dose to 10 mg.  Otherwise we will probably need to consider some leg Zetia.

## 2019-11-11 NOTE — Assessment & Plan Note (Signed)
Has been evaluated not that long ago.  Can probably wait 5 years from last evaluation

## 2019-11-11 NOTE — Assessment & Plan Note (Signed)
She has another game plan for what to do for weight loss.  Hopefully this time around she will actually able to keep the weight off.  This will help her blood pressure and lipids.  Unfortunately, she is limited as far as exercise because of her arthritis pains and probably PMR.  I encouraged her to continue doing the water aerobics in the chair aerobics as those are good ways of doing exercises without straining the joints too much.

## 2019-11-11 NOTE — Assessment & Plan Note (Signed)
Blood pressure is still a little high.  I want to see what her pressures are at home.  She tells me that every time she is going to get an evaluation, she gets nervous and her pressures go up.  I have asked that she try to get her blood pressures drawn when she goes to the [facility] for her classes.  Is usually given be relatively improved at that time.  If pressures are still high, I think we probably will need to add a new medicine or switch from losartan to a newer ARB.

## 2019-11-15 DIAGNOSIS — Z1211 Encounter for screening for malignant neoplasm of colon: Secondary | ICD-10-CM | POA: Diagnosis not present

## 2019-11-15 DIAGNOSIS — Z01818 Encounter for other preprocedural examination: Secondary | ICD-10-CM | POA: Diagnosis not present

## 2019-11-16 DIAGNOSIS — H40013 Open angle with borderline findings, low risk, bilateral: Secondary | ICD-10-CM | POA: Diagnosis not present

## 2020-01-05 ENCOUNTER — Other Ambulatory Visit: Payer: Self-pay | Admitting: Internal Medicine

## 2020-01-05 DIAGNOSIS — Z1231 Encounter for screening mammogram for malignant neoplasm of breast: Secondary | ICD-10-CM

## 2020-01-13 DIAGNOSIS — Z23 Encounter for immunization: Secondary | ICD-10-CM | POA: Diagnosis not present

## 2020-01-13 DIAGNOSIS — M8949 Other hypertrophic osteoarthropathy, multiple sites: Secondary | ICD-10-CM | POA: Diagnosis not present

## 2020-01-13 DIAGNOSIS — L821 Other seborrheic keratosis: Secondary | ICD-10-CM | POA: Diagnosis not present

## 2020-02-08 ENCOUNTER — Other Ambulatory Visit: Payer: Self-pay

## 2020-02-08 ENCOUNTER — Ambulatory Visit
Admission: RE | Admit: 2020-02-08 | Discharge: 2020-02-08 | Disposition: A | Discharge status: Home / Self Care | Payer: Medicare PPO | Source: Ambulatory Visit | Attending: Internal Medicine | Admitting: Internal Medicine

## 2020-02-08 DIAGNOSIS — Z1231 Encounter for screening mammogram for malignant neoplasm of breast: Secondary | ICD-10-CM

## 2020-02-19 ENCOUNTER — Ambulatory Visit: Payer: Medicare PPO | Attending: Internal Medicine

## 2020-02-19 DIAGNOSIS — Z23 Encounter for immunization: Secondary | ICD-10-CM

## 2020-02-19 NOTE — Progress Notes (Signed)
   Covid-19 Vaccination Clinic  Name:  Sonia Beck    MRN: [id number] DOB: [birth year]  02/19/2020  Sonia Beck was observed post Covid-19 immunization for 15 minutes without incident. She was provided with Vaccine Information Sheet and instruction to access the V-Safe system.   Sonia Beck was instructed to call 911 with any severe reactions post vaccine: [name] Kitchen Difficulty breathing  . Swelling of face and throat  . A fast heartbeat  . A bad rash all over body  . Dizziness and weakness

## 2020-03-14 ENCOUNTER — Other Ambulatory Visit: Payer: Self-pay | Admitting: Cardiology

## 2020-04-19 ENCOUNTER — Encounter (INDEPENDENT_AMBULATORY_CARE_PROVIDER_SITE_OTHER): Payer: Self-pay

## 2020-04-26 DIAGNOSIS — N182 Chronic kidney disease, stage 2 (mild): Secondary | ICD-10-CM | POA: Diagnosis not present

## 2020-04-26 DIAGNOSIS — E782 Mixed hyperlipidemia: Secondary | ICD-10-CM | POA: Diagnosis not present

## 2020-04-26 DIAGNOSIS — Z23 Encounter for immunization: Secondary | ICD-10-CM | POA: Diagnosis not present

## 2020-04-26 DIAGNOSIS — Z0000 Encounter for general adult medical examination without abnormal findings: Secondary | ICD-10-CM | POA: Diagnosis not present

## 2020-04-26 DIAGNOSIS — K2100 Gastro-esophageal reflux disease with esophagitis, without bleeding: Secondary | ICD-10-CM | POA: Diagnosis not present

## 2020-04-26 DIAGNOSIS — R7309 Other abnormal glucose: Secondary | ICD-10-CM | POA: Diagnosis not present

## 2020-04-26 DIAGNOSIS — I10 Essential (primary) hypertension: Secondary | ICD-10-CM | POA: Diagnosis not present

## 2020-04-26 LAB — HEPATIC FUNCTION PANEL
ALT: 24 (ref 7–35)
AST: 20 (ref 13–35)
Alkaline Phosphatase: 113 (ref 25–125)
Bilirubin, Total: 0.5

## 2020-04-26 LAB — CBC AND DIFFERENTIAL
HCT: 37 (ref 36–46)
Hemoglobin: 12.5 (ref 12.0–16.0)
Platelets: 473 — AB (ref 150–399)
WBC: 8

## 2020-04-26 LAB — TSH: TSH: 1.25 (ref 0.41–5.90)

## 2020-04-26 LAB — COMPREHENSIVE METABOLIC PANEL
Albumin: 3.9 (ref 3.5–5.0)
Calcium: 9.9 (ref 8.7–10.7)
GFR calc Af Amer: 69.74
GFR calc non Af Amer: 57.64
Globulin: 4.6

## 2020-04-26 LAB — BASIC METABOLIC PANEL
BUN: 12 (ref 4–21)
CO2: 30 — AB (ref 13–22)
Chloride: 101 (ref 99–108)
Creatinine: 1 (ref 0.5–1.1)
Glucose: 90
Potassium: 4.4 (ref 3.4–5.3)
Sodium: 142 (ref 137–147)

## 2020-04-26 LAB — HEMOGLOBIN A1C: Hemoglobin A1C: 6.2

## 2020-04-26 LAB — LIPID PANEL
Cholesterol: 194 (ref 0–200)
HDL: 73 — AB (ref 35–70)
LDL Cholesterol: 109
Triglycerides: 59 (ref 40–160)

## 2020-04-26 LAB — CBC: RBC: 4.86 (ref 3.87–5.11)

## 2020-05-01 DIAGNOSIS — N182 Chronic kidney disease, stage 2 (mild): Secondary | ICD-10-CM | POA: Diagnosis not present

## 2020-05-01 DIAGNOSIS — I10 Essential (primary) hypertension: Secondary | ICD-10-CM | POA: Diagnosis not present

## 2020-05-01 DIAGNOSIS — E782 Mixed hyperlipidemia: Secondary | ICD-10-CM | POA: Diagnosis not present

## 2020-05-01 DIAGNOSIS — Z0000 Encounter for general adult medical examination without abnormal findings: Secondary | ICD-10-CM | POA: Diagnosis not present

## 2020-05-01 DIAGNOSIS — J45909 Unspecified asthma, uncomplicated: Secondary | ICD-10-CM | POA: Diagnosis not present

## 2020-05-01 DIAGNOSIS — R7309 Other abnormal glucose: Secondary | ICD-10-CM | POA: Diagnosis not present

## 2020-05-01 DIAGNOSIS — F321 Major depressive disorder, single episode, moderate: Secondary | ICD-10-CM | POA: Diagnosis not present

## 2020-05-02 ENCOUNTER — Ambulatory Visit (INDEPENDENT_AMBULATORY_CARE_PROVIDER_SITE_OTHER): Payer: Medicare PPO | Admitting: Family Medicine

## 2020-05-02 ENCOUNTER — Encounter (INDEPENDENT_AMBULATORY_CARE_PROVIDER_SITE_OTHER): Payer: Self-pay | Admitting: Family Medicine

## 2020-05-02 ENCOUNTER — Other Ambulatory Visit: Payer: Self-pay

## 2020-05-02 VITALS — BP 178/80 | HR 73 | Temp 98.3°F | Ht 60.0 in | Wt 208.0 lb

## 2020-05-02 DIAGNOSIS — I10 Essential (primary) hypertension: Secondary | ICD-10-CM | POA: Insufficient documentation

## 2020-05-02 DIAGNOSIS — E7849 Other hyperlipidemia: Secondary | ICD-10-CM

## 2020-05-02 DIAGNOSIS — Z9189 Other specified personal risk factors, not elsewhere classified: Secondary | ICD-10-CM | POA: Insufficient documentation

## 2020-05-02 DIAGNOSIS — R5383 Other fatigue: Secondary | ICD-10-CM | POA: Diagnosis not present

## 2020-05-02 DIAGNOSIS — R0602 Shortness of breath: Secondary | ICD-10-CM | POA: Diagnosis not present

## 2020-05-02 DIAGNOSIS — F39 Unspecified mood [affective] disorder: Secondary | ICD-10-CM

## 2020-05-02 DIAGNOSIS — Z6841 Body Mass Index (BMI) 40.0 and over, adult: Secondary | ICD-10-CM | POA: Diagnosis not present

## 2020-05-02 DIAGNOSIS — R7303 Prediabetes: Secondary | ICD-10-CM | POA: Diagnosis not present

## 2020-05-02 DIAGNOSIS — F3289 Other specified depressive episodes: Secondary | ICD-10-CM | POA: Diagnosis not present

## 2020-05-02 DIAGNOSIS — E8881 Metabolic syndrome: Secondary | ICD-10-CM | POA: Insufficient documentation

## 2020-05-02 DIAGNOSIS — Z0289 Encounter for other administrative examinations: Secondary | ICD-10-CM

## 2020-05-02 DIAGNOSIS — F419 Anxiety disorder, unspecified: Secondary | ICD-10-CM | POA: Insufficient documentation

## 2020-05-03 LAB — INSULIN, RANDOM: INSULIN: 9.9 u[IU]/mL (ref 2.6–24.9)

## 2020-05-03 LAB — T3: T3, Total: 155 ng/dL (ref 71–180)

## 2020-05-03 LAB — VITAMIN B12: Vitamin B-12: 594 pg/mL (ref 232–1245)

## 2020-05-03 LAB — FOLATE: Folate: 6.4 ng/mL (ref >3.0)

## 2020-05-03 LAB — T4, FREE: Free T4: 1.25 ng/dL (ref 0.82–1.77)

## 2020-05-03 LAB — TSH: TSH: 0.821 u[IU]/mL (ref 0.450–4.500)

## 2020-05-03 LAB — VITAMIN D 25 HYDROXY (VIT D DEFICIENCY, FRACTURES): Vit D, 25-Hydroxy: 20.9 ng/mL — ABNORMAL LOW (ref 30.0–100.0)

## 2020-05-03 NOTE — Progress Notes (Signed)
Dear Sonia Beck,   Thank you for referring Sonia Beck to our clinic. The following note includes my evaluation and treatment recommendations  Chief Complaint:   OBESITY Sonia Beck (MR# [id number]) is a 75 y.o. female who presents for evaluation and treatment of obesity and related comorbidities. Current BMI is Body mass index is 40.62 kg/m. Sonia Beck has been struggling with her weight for many years and has been unsuccessful in either losing weight, maintaining weight loss, or reaching her healthy weight goal.  Sonia Beck is currently in the action stage of change and ready to dedicate time achieving and maintaining a healthier weight. Sonia Beck is interested in becoming our patient and working on intensive lifestyle modifications including (but not limited to) diet and exercise for weight loss.  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 Her 56 y.o. grandson lives with her. She craves sweets and salty, especially between 2-8pm. She snacks on chips, cookies, and occasionally fruit. She drinks milk, juice, sweet tea, and smoothies. She had labs done 04/26/2020 at PCP's office. These were abstracted to chart today. She was referred by Sonia Beck, her physical therapist at [facility].  Sonia Beck's habits were reviewed today and are as follows: Her family eats meals together, she thinks her family will eat healthier with her, her desired weight loss is 48 lbs, her heaviest weight ever was 220 pounds, she has significant food cravings issues, she snacks frequently in the evenings, she is frequently drinking liquids with calories, she frequently makes poor food choices, she frequently eats larger portions than normal, she has binge eating behaviors and she struggles with emotional eating.  This is the patient's first visit at [facility].  The patient's NEW PATIENT PACKET that they filled out prior to today's office visit was reviewed at length and information  from that paperwork was included within the following office visit note.    Included in the packet: current and past health history, medications, allergies, ROS, gynecologic history (women only), surgical history, family history, social history, weight history, weight loss surgery history (for those that have had weight loss surgery), nutritional evaluation, mood and food questionnaire along with a depression screening (PHQ9) on all patients, an Epworth questionnaire, sleep habits questionnaire, patient life and health improvement goals questionnaire. These will all be scanned into the patient's chart under media.   During the visit, I independently reviewed the patient's EKG, bioimpedance scale results, and indirect calorimeter results. I used this information to tailor a meal plan for the patient that will help Sonia Beck to lose weight and will improve her obesity-related conditions going forward.  I performed a medically necessary appropriate examination and/or evaluation. I discussed the assessment and treatment plan with the patient. The patient was provided an opportunity to ask questions and all were answered. The patient agreed with the plan and demonstrated an understanding of the instructions. Labs were ordered today (unless patient declined them) and will be reviewed with the patient at our next visit unless more critical results need to be addressed immediately. Clinical information was updated and documented in the EMR.  Time spent on visit including pre-visit chart review and post-visit care was estimated to be 60  minutes.  A separate 15 minutes was spent on risk counseling (see above/below).   Depression Screen Sonia Beck's Food and Mood (modified PHQ-9) score was 15.  Depression screen [facility] 2/9 05/02/2020  Decreased Interest 2  Down, Depressed, Hopeless 1  PHQ - 2 Score 3  Altered sleeping 2  Tired,  decreased energy 3  Change in appetite 2  Feeling bad or failure about yourself  1   Trouble concentrating 1  Moving slowly or fidgety/restless 3  Suicidal thoughts 0  PHQ-9 Score 15  Difficult doing work/chores Somewhat difficult    Assessment/Plan:   1. Other fatigue Sonia Beck admits to daytime somnolence and admits to waking up still tired. Patent has a history of symptoms of daytime fatigue. Sonia Beck generally gets is not sure hours of sleep per night, and states that she has poor sleep quality. Snoring is not sure if is present. Apneic episodes are not present. Epworth Sleepiness Score is 7.  Plan: [name] does feel that her weight is causing her energy to be lower than it should be. Fatigue may be related to obesity, depression or many other causes. Labs will be ordered, and in the meanwhile, Sonia Beck will focus on self care including making healthy food choices, increasing physical activity and focusing on stress reduction. Check labs today. Obtain results from PCP.  Lab/Orders today or future: - Vitamin B12 - Folate - Insulin, random - T3 - T4, free - TSH - VITAMIN D 25 Hydroxy (Vit-D Deficiency, Fractures)  2. SOBOE (shortness of breath on exertion) Sonia Beck notes increasing shortness of breath with exercising and seems to be worsening over time with weight gain. She notes getting out of breath sooner with activity than she used to. This has gotten worse recently. [name] denies shortness of breath at rest or orthopnea.  Plan: Sonia Beck does feel that she gets out of breath more easily that she used to when she exercises. Bridie's shortness of breath appears to be obesity related and exercise induced. She has agreed to work on weight loss and gradually increase exercise to treat her exercise induced shortness of breath. Will continue to monitor closely. Check labs today. Obtain results from PCP  Lab/Orders today or future: - Vitamin B12 - Folate - Insulin, random - T3 - T4, free - TSH - VITAMIN D 25 Hydroxy (Vit-D Deficiency, Fractures)  3. White  coat syndrome with diagnosis of hypertension Pt sees cardiology due to strong family history of CAD. They control her BP. She is taking Norvasc, triamterene/HCTZ, and losartan.  Review: taking medications as instructed, no medication side effects noted, no chest pain on exertion, no dyspnea on exertion, no swelling of ankles.   BP Readings from Last 3 Encounters:  05/02/20 (!) 178/80  11/08/19 (!) 152/84  08/14/19 (!) 193/93   Plan: Per pt, cardiologist knows she has white coat syndrome and to practice home blood pressure monitoring. Calyssa is working on healthy weight loss and exercise to improve blood pressure control. We will watch for signs of hypotension as she continues her lifestyle modifications.  4. Pre-diabetes Pt is not sure how long she has been diagnosed with pre-DM. She has not been on meds for this in the past or currently.  Anaid has a diagnosis of prediabetes based on her elevated HgA1c and was informed this puts her at greater risk of developing diabetes. She continues to work on diet and exercise to decrease her risk of diabetes. She denies nausea or hypoglycemia.  Lab Results  Component Value Date   HGBA1C 6.1 03/13/2007   Lab Results  Component Value Date   INSULIN 9.9 05/02/2020   Plan: No meds. We will check labs. Will follow. Sonia Beck will continue to work on weight loss, exercise, and decreasing simple carbohydrates to help decrease the risk of diabetes.   Lab/Orders today or future: -  Vitamin B12 - Folate - Insulin, random - T3 - T4, free - TSH - VITAMIN D 25 Hydroxy (Vit-D Deficiency, Fractures)  5. Other hyperlipidemia Pt is on Crestor.  Sonia Beck has hyperlipidemia and has been trying to improve her cholesterol levels with intensive lifestyle modification including a low saturated fat diet, exercise and weight loss. She denies any chest pain, claudication or myalgias.  Lab Results  Component Value Date   ALT 14 01/14/2016   AST 24 01/14/2016    ALKPHOS 92 01/14/2016   BILITOT 0.5 01/14/2016   No results found for: CHOL, HDL, LDLCALC, LDLDIRECT, TRIG, CHOLHDL  Plan: Cardiovascular risk and specific lipid/LDL goals reviewed.  We discussed several lifestyle modifications today and Sonia Beck will continue to work on diet, exercise and weight loss efforts. Orders and follow up as documented in patient record. Check labs and obtain recent labs done at PCP's office.  Counseling Intensive lifestyle modifications are the first line treatment for this issue. . Dietary changes: Increase soluble fiber. Decrease simple carbohydrates. . Exercise changes: Moderate to vigorous-intensity aerobic activity 150 minutes per week if tolerated. . Lipid-lowering medications: see documented in medical record.  Lab/Orders today or future: - Vitamin B12 - Folate - Insulin, random - T3 - T4, free - TSH - VITAMIN D 25 Hydroxy (Vit-D Deficiency, Fractures)  6. Mood disorder (HCC) with emotional eating Pt has anxiety and depression. Has PHQ-9 score of 15. She is on fluoxetine and Xanax. Sonia Beck is struggling with emotional eating and using food for comfort to the extent that it is negatively impacting her health. She has been working on behavior modification techniques to help reduce her emotional eating. She shows no sign of suicidal or homicidal ideations.  Plan: Treatment plan per PCP. We will also closely monitor. Check labs today. Behavior modification techniques were discussed today to help Sonia Beck deal with her emotional/non-hunger eating behaviors.  Orders and follow up as documented in patient record.   Lab/Orders today or future: - Vitamin B12 - Folate - Insulin, random - T3 - T4, free - TSH - VITAMIN D 25 Hydroxy (Vit-D Deficiency, Fractures)  7. At risk for heart disease Due to Sonia Beck's current state of health and medical condition(s), she is at a higher risk for heart disease.   This puts the patient at much greater risk to  subsequently develop cardiopulmonary conditions that can significantly affect patient's quality of life in a negative manner as well.    At least 24 minutes was spent on counseling Sonia Beck about these concerns today and I stressed the importance of reversing risks factors of obesity, esp truncal and visceral fat, hypertension, hyperlipidemia, pre-diabetes.   Initial goal is to lose at least 5-10% of starting weight to help reduce these risk factors.   Counseling: Intensive lifestyle modifications discussed with Sonia Beck as most appropriate first line treatment.  she will continue to work on diet, exercise and weight loss efforts.  We will continue to reassess these conditions on a fairly regular basis in an attempt to decrease patient's overall morbidity and mortality.  Evidence-based interventions for health behavior change were utilized today including the discussion of self monitoring techniques, problem-solving barriers and SMART goal setting techniques.  Specifically regarding patient's less desirable eating habits and patterns, we employed the technique of small changes when Shandria has not been able to fully commit to her prudent nutritional plan.   8. Class 3 severe obesity with serious comorbidity and body mass index (BMI) of 40.0 to 44.9 in adult, unspecified obesity  type [facility]) Sonia Beck is currently in the action stage of change and her goal is to continue with weight loss efforts. I recommend Metzli begin the structured treatment plan as follows:  She has agreed to the Category 1 Plan.  Exercise goals: As is   Behavioral modification strategies: meal planning and cooking strategies, keeping healthy foods in the home and planning for success.  She was informed of the importance of frequent follow-up visits to maximize her success with intensive lifestyle modifications for her multiple health conditions. She was informed we would discuss her lab results at her next visit unless there is  a critical issue that needs to be addressed sooner. Sonia Beck agreed to keep her next visit at the agreed upon time to discuss these results.  Objective:   Blood pressure (!) 178/80, pulse 73, temperature 98.3 F (36.8 C), height 5' (1.524 m), weight 208 lb (94.3 kg), SpO2 97 %. Body mass index is 40.62 kg/m.  EKG: Normal sinus rhythm, rate 71 (11/08/2019).  Indirect Calorimeter completed today shows a VO2 of 193 and a REE of 1342.  Her calculated basal metabolic rate is 0141 thus her basal metabolic rate is worse than expected.  General: Cooperative, alert, well developed, in no acute distress. HEENT: Conjunctivae and lids unremarkable. Cardiovascular: Regular rhythm.  Lungs: Normal work of breathing. Neurologic: No focal deficits.   Lab Results  Component Value Date   CREATININE 0.71 01/14/2016   BUN 11 01/14/2016   NA 136 01/14/2016   K 2.8 (L) 01/14/2016   CL 98 (L) 01/14/2016   CO2 26 01/14/2016   Lab Results  Component Value Date   ALT 14 01/14/2016   AST 24 01/14/2016   ALKPHOS 92 01/14/2016   BILITOT 0.5 01/14/2016   Lab Results  Component Value Date   HGBA1C 6.1 03/13/2007   Lab Results  Component Value Date   INSULIN 9.9 05/02/2020   Lab Results  Component Value Date   TSH 0.821 05/02/2020   No results found for: CHOL, HDL, LDLCALC, LDLDIRECT, TRIG, CHOLHDL Lab Results  Component Value Date   WBC 11.4 (H) 01/14/2016   HGB 12.7 01/14/2016   HCT 37.4 01/14/2016   MCV 77.1 (L) 01/14/2016   PLT 446 (H) 01/14/2016   No results found for: IRON, TIBC, FERRITIN  Attestation Statements:   Reviewed by clinician on day of visit: allergies, medications, problem list, medical history, surgical history, family history, social history, and previous encounter notes.  Sonia Beck, am acting as Energy manager for [employer], DO.  I have reviewed the above documentation for accuracy and completeness, and I agree with the above. Sonia Beck,  D.O.  The 21st Century Cures Act was signed into law in 2016 which includes the topic of electronic health records.  This provides immediate access to information in MyChart.  This includes consultation notes, operative notes, office notes, lab results and pathology reports.  If you have any questions about what you read please let us know at your next visit so we can discuss your concerns and take corrective action if need be.  We are right here with you.

## 2020-05-16 ENCOUNTER — Other Ambulatory Visit: Payer: Self-pay

## 2020-05-16 ENCOUNTER — Other Ambulatory Visit (INDEPENDENT_AMBULATORY_CARE_PROVIDER_SITE_OTHER): Payer: Self-pay

## 2020-05-16 ENCOUNTER — Encounter (INDEPENDENT_AMBULATORY_CARE_PROVIDER_SITE_OTHER): Payer: Self-pay | Admitting: Family Medicine

## 2020-05-16 ENCOUNTER — Ambulatory Visit (INDEPENDENT_AMBULATORY_CARE_PROVIDER_SITE_OTHER): Payer: Medicare PPO | Admitting: Family Medicine

## 2020-05-16 VITALS — BP 165/95 | HR 79 | Temp 98.1°F | Ht 60.0 in | Wt 202.0 lb

## 2020-05-16 DIAGNOSIS — Z6839 Body mass index (BMI) 39.0-39.9, adult: Secondary | ICD-10-CM

## 2020-05-16 DIAGNOSIS — I10 Essential (primary) hypertension: Secondary | ICD-10-CM

## 2020-05-16 DIAGNOSIS — E7849 Other hyperlipidemia: Secondary | ICD-10-CM | POA: Diagnosis not present

## 2020-05-16 DIAGNOSIS — E559 Vitamin D deficiency, unspecified: Secondary | ICD-10-CM | POA: Diagnosis not present

## 2020-05-16 DIAGNOSIS — R7303 Prediabetes: Secondary | ICD-10-CM

## 2020-05-16 MED ORDER — VITAMIN D (ERGOCALCIFEROL) 1.25 MG (50000 UNIT) PO CAPS: 50000.0000 [IU] | ORAL_CAPSULE | ORAL | 0 refills | Status: DC

## 2020-05-18 NOTE — Progress Notes (Signed)
Chief Complaint:   OBESITY Sonia Beck is here to discuss her progress with her obesity treatment plan along with follow-up of her obesity related diagnoses.   Today's visit was #: 2 Starting weight: 208 lbs Starting date: 05/02/2020 Today's weight: 202 lbs Today's date: 05/16/2020 Total lbs lost to date: 6 lbs Body mass index is 39.45 kg/m.  Total weight loss percentage to date: 2.88%  Interim History:   Sonia Beck is here today to review her NEW Meal Plan and to discuss all recent labs done here and/ or done at outside facilities. This is patient's first follow up visit.  - Extended time was spent counseling Sonia Beck on all new disease processes that were discovered or that are worsening.  Sonia Beck says that she had some hunger, but overall, things went well with no issues.  Nutrition Plan: Category 1 for 45-50% of the time. Activity: None at this time.  Assessment/Plan:   Meds ordered this encounter  Medications  . Vitamin D, Ergocalciferol, (DRISDOL) 1.25 MG (50000 UNIT) CAPS capsule    Sig: Take 1 capsule (50,000 Units total) by mouth every 7 (seven) days.    Dispense:  4 capsule    Refill:  0     1. White coat syndrome with diagnosis of hypertension Discussed labs with patient today.    Not at goal in office but per pt, controlled at home and she tells me she has a dx of White Coat Syndrome.  -  Medications: Norvasc 10 mg daily, Cozaar 100 mg daily, Maxzide 37.5-25.   - Treatment per Cardiology.  Creatinine 1.0, GFR 58.  Plan: - BP is not at goal today in office but per pt, has White Coat. - Counseled Sonia Beck on pathophysiology of disease and discussed treatment plan, which always includes dietary and lifestyle modification as first line.  - Lifestyle changes such as following our low salt, heart healthy meal plan and engaging in a regular exercise program discussed extensively with patient.  - Avoid buying foods that are: processed,  frozen, or prepackaged to avoid excess salt. - Ambulatory blood pressure monitoring encouraged at least 2-3 times weekly.  Keep log and bring to office visit.  Reminded patient that if they ever feel poorly in any way, to check their blood pressure and pulse as well. - We will continue to monitor closely alongside specialists.  Pt reminded to also f/up with those individuals as instructed by them.  - We will continue to monitor symptoms as they relate to the her weight loss journey.  BP Readings from Last 3 Encounters:  05/16/20 (!) 165/95  05/02/20 (!) 178/80  11/08/19 (!) 152/84   Lab Results  Component Value Date   CREATININE 1.0 04/26/2020   2. Prediabetes Discussed labs with patient today.    Goal is HgbA1c < 5.7.  Medication: None.  A1c 6.2.  FI 9.9.  Labs from PCP (CMP, CBC, FLP) done on 04/26/2020 were reviewed also.  Plan: Disease counseling done.  She will continue to focus on protein-rich, low simple carbohydrate foods. We reviewed the importance of hydration, regular exercise for stress reduction, and restorative sleep.   Lab Results  Component Value Date   HGBA1C 6.2 04/26/2020   Lab Results  Component Value Date   INSULIN 9.9 05/02/2020   3. Other hyperlipidemia Discussed labs with patient today.  Lipid-lowering medications: Crestor 5 mg daily - med mgt per PCP   Plan:  - Disease counseling done.  -  Dietary changes: Increase soluble fiber, decrease simple carbohydrates, decrease saturated fat.  - Exercise changes: Moderate to vigorous-intensity aerobic activity 150 minutes per week or as tolerated.  - We will continue to monitor along with PCP/specialists as it pertains to her weight loss journey.  Lab Results  Component Value Date   CHOL 194 04/26/2020   HDL 73 (A) 04/26/2020   LDLCALC 109 04/26/2020   TRIG 59 04/26/2020   Lab Results  Component Value Date   ALT 24 04/26/2020   AST 20 04/26/2020   ALKPHOS 113 04/26/2020   BILITOT 0.5 01/14/2016   The  10-year ASCVD risk score Denman George DC Jr., et al., 2013) is: 20.6%*   Values used to calculate the score:     Age: 56 years     Sex: Female     Is Non-Hispanic African American: Yes     Diabetic: No     Tobacco smoker: No     Systolic Blood Pressure: 165 mmHg     Is BP treated: Yes     HDL Cholesterol: 73 mg/dL*     Total Cholesterol: 194 mg/dL*     * - Cholesterol units were assumed for this score calculation  4. Vitamin D deficiency New.    Discussed labs with patient today.    Not at goal. Current vitamin D is 20.9, tested on 05/02/2020. Optimal goal > 50 ng/dL.   Plan: - Discussed importance of vitamin D to their health and well-being.  - possible symptoms of low Vitamin D can be low energy, depressed mood, muscle aches, joint aches, osteoporosis etc. - low Vitamin D levels may be linked to an increased risk of cardiovascular events and even increased risk of cancers- such as colon and breast.  - I recommend pt take a weekly prescription vit D - see script below   - Informed patient this may be a lifelong thing, and she was encouraged to continue to take the medicine until told otherwise.   - we will need to monitor levels regularly (every 3-4 mo on average) to keep levels within normal limits.  - weight loss will likely improve availability of vitamin D, thus encouraged Memory to continue with meal plan and their weight loss efforts to further improve this condition - pt's questions and concerns regarding this condition addressed.  - Start Vitamin D, Ergocalciferol, (DRISDOL) 1.25 MG (50000 UNIT) CAPS capsule; Take 1 capsule (50,000 Units total) by mouth every 7 (seven) days.  Dispense: 4 capsule; Refill: 0  5. Class 2 severe obesity with serious comorbidity and body mass index (BMI) of 39.0 to 39.9 in adult, unspecified obesity type (HCC)  Course: Sonia Beck is currently in the action stage of change. As such, her goal is to continue with weight loss efforts.   Nutrition goals:  She has agreed to the Category 1 Plan - plus add 100 calories of a 10:1 ratio item.   Exercise goals: As is.  Behavioral modification strategies: increasing lean protein intake, decreasing sodium intake, no skipping meals, meal planning and cooking strategies, keeping healthy foods in the home and planning for success.  Adrena has agreed to follow-up with our clinic in 2 weeks. She was informed of the importance of frequent follow-up visits to maximize her success with intensive lifestyle modifications for her multiple health conditions.     Objective:   Blood pressure (!) 165/95, pulse 79, temperature 98.1 F (36.7 C), height 5' (1.524 m), weight 202 lb (91.6 kg), SpO2 97 %. Body mass  index is 39.45 kg/m.  General: Cooperative, alert, well developed, in no acute distress. HEENT: Conjunctivae and lids unremarkable. Cardiovascular: Regular rhythm.  Lungs: Normal work of breathing. Neurologic: No focal deficits.   Lab Results  Component Value Date   CREATININE 1.0 04/26/2020   BUN 12 04/26/2020   NA 142 04/26/2020   K 4.4 04/26/2020   CL 101 04/26/2020   CO2 30 (A) 04/26/2020   Lab Results  Component Value Date   ALT 24 04/26/2020   AST 20 04/26/2020   ALKPHOS 113 04/26/2020   BILITOT 0.5 01/14/2016   Lab Results  Component Value Date   HGBA1C 6.2 04/26/2020   HGBA1C 6.1 03/13/2007   Lab Results  Component Value Date   INSULIN 9.9 05/02/2020   Lab Results  Component Value Date   TSH 0.821 05/02/2020   Lab Results  Component Value Date   CHOL 194 04/26/2020   HDL 73 (A) 04/26/2020   LDLCALC 109 04/26/2020   TRIG 59 04/26/2020   Lab Results  Component Value Date   WBC 8.0 04/26/2020   HGB 12.5 04/26/2020   HCT 37 04/26/2020   MCV 77.1 (L) 01/14/2016   PLT 473 (A) 04/26/2020   Obesity Behavioral Intervention:   Approximately 15 minutes were spent on the discussion below.  ASK: We discussed the diagnosis of obesity with Claris Che today and Ema  agreed to give [place] permission to discuss obesity behavioral modification therapy today.  ASSESS: Jonesha has the diagnosis of obesity and her BMI today is 39.6. Pepper is in the action stage of change.   ADVISE: Emberlie was educated on the multiple health risks of obesity as well as the benefit of weight loss to improve her health. She was advised of the need for long term treatment and the importance of lifestyle modifications to improve her current health and to decrease her risk of future health problems.  AGREE: Multiple dietary modification options and treatment options were discussed and Veronica agreed to follow the recommendations documented in the above note.  ARRANGE: Cassey was educated on the importance of frequent visits to treat obesity as outlined per CMS and USPSTF guidelines and agreed to schedule her next follow up appointment today.  Attestation Statements:   Reviewed by clinician on day of visit: allergies, medications, problem list, medical history, surgical history, family history, social history, and previous encounter notes.  I, Insurance claims handler, CMA, am acting as Energy manager for [employer], DO.  I have reviewed the above documentation for accuracy and completeness, and I agree with the above. Carlye Grippe, D.O.  The 21st Century Cures Act was signed into law in 2016 which includes the topic of electronic health records.  This provides immediate access to information in MyChart.  This includes consultation notes, operative notes, office notes, lab results and pathology reports.  If you have any questions about what you read please let us know at your next visit so we can discuss your concerns and take corrective action if need be.  We are right here with you.

## 2020-05-22 DIAGNOSIS — H2513 Age-related nuclear cataract, bilateral: Secondary | ICD-10-CM | POA: Diagnosis not present

## 2020-05-22 DIAGNOSIS — H40013 Open angle with borderline findings, low risk, bilateral: Secondary | ICD-10-CM | POA: Diagnosis not present

## 2020-05-22 DIAGNOSIS — H04123 Dry eye syndrome of bilateral lacrimal glands: Secondary | ICD-10-CM | POA: Diagnosis not present

## 2020-05-22 DIAGNOSIS — H25013 Cortical age-related cataract, bilateral: Secondary | ICD-10-CM | POA: Diagnosis not present

## 2020-06-05 ENCOUNTER — Other Ambulatory Visit: Payer: Self-pay

## 2020-06-05 ENCOUNTER — Encounter (INDEPENDENT_AMBULATORY_CARE_PROVIDER_SITE_OTHER): Payer: Self-pay | Admitting: Family Medicine

## 2020-06-05 ENCOUNTER — Ambulatory Visit (INDEPENDENT_AMBULATORY_CARE_PROVIDER_SITE_OTHER): Payer: Medicare PPO | Admitting: Family Medicine

## 2020-06-05 VITALS — BP 167/82 | HR 69 | Temp 97.9°F | Ht 60.0 in | Wt 202.0 lb

## 2020-06-05 DIAGNOSIS — I10 Essential (primary) hypertension: Secondary | ICD-10-CM

## 2020-06-05 DIAGNOSIS — E559 Vitamin D deficiency, unspecified: Secondary | ICD-10-CM | POA: Diagnosis not present

## 2020-06-05 DIAGNOSIS — Z6839 Body mass index (BMI) 39.0-39.9, adult: Secondary | ICD-10-CM

## 2020-06-05 MED ORDER — VITAMIN D (ERGOCALCIFEROL) 1.25 MG (50000 UNIT) PO CAPS: 50000.0000 [IU] | ORAL_CAPSULE | ORAL | 0 refills | Status: DC

## 2020-06-07 NOTE — Progress Notes (Signed)
Chief Complaint:   OBESITY Sonia Beck is here to discuss her progress with her obesity treatment plan along with follow-up of her obesity related diagnoses.   Today's visit was #: 3 Starting weight: 208 lbs Starting date: 05/02/2020 Today's weight: 202 lbs Today's date: 06/05/2020 Total lbs lost to date: 6 lbs Body mass index is 39.45 kg/m.  Total weight loss percentage to date: -2.88%  Interim History:  Sonia Beck has gained 3 pounds in water since her last office visit.  She says her clothes feel looser and can fit into things now she could not fit into before.  She has more energy as well, she says.  Plan:  Sonia Beck's goal is to start exercising at [facility].  Current Meal Plan: the Category 1 Plan +100 calories and 10:1 ratio for 80% of the time.  Current Exercise Plan: None. Current Anti-Obesity Medications: None.  Assessment/Plan:   No orders of the defined types were placed in this encounter.   Medications Discontinued During This Encounter  Medication Reason  . Vitamin D, Ergocalciferol, (DRISDOL) 1.25 MG (50000 UNIT) CAPS capsule Reorder     Meds ordered this encounter  Medications  . Vitamin D, Ergocalciferol, (DRISDOL) 1.25 MG (50000 UNIT) CAPS capsule    Sig: Take 1 capsule (50,000 Units total) by mouth every 7 (seven) days.    Dispense:  4 capsule    Refill:  0     1. White coat syndrome with diagnosis of hypertension Uncontrolled.  Medications: Norvasc 10 mg daily, losartan 100 mg daily, Maxzide 37.5-25 mg daily.  She has not checked her blood pressure at home lately but is watching her salt intake and having less than 1500 mg per day.  Plan:  Avoid buying foods that are: processed, frozen, or prepackaged to avoid excess salt. Ambulatory blood pressure monitoring was encouraged with a goal of at least 2-3 times weekly or when feeling poorly.  She was instructed to keep a log for [place] to review at each office visit.    Management per  Cardiology, Dr. Herbie Baltimore.  Call Dr. Herbie Baltimore if not at goal.  Continue to decrease salt and follow meal plan and lose weight.  BP Readings from Last 3 Encounters:  06/05/20 (!) 167/82  05/16/20 (!) 165/95  05/02/20 (!) 178/80   Lab Results  Component Value Date   CREATININE 1.0 04/26/2020   2. Vitamin D deficiency Not at goal. Current vitamin D is 20.9, tested on 05/02/2020. Optimal goal > 50 ng/dL.  She is taking vitamin D 50,000 IU weekly.  Plan: Continue to take prescription Vitamin D @50 ,000 IU every week as prescribed.  Follow-up for routine testing of Vitamin D, at least 2-3 times per year to avoid over-replacement.  - Refill Vitamin D, Ergocalciferol, (DRISDOL) 1.25 MG (50000 UNIT) CAPS capsule; Take 1 capsule (50,000 Units total) by mouth every 7 (seven) days.  Dispense: 4 capsule; Refill: 0  3. Class 2 severe obesity with serious comorbidity and body mass index (BMI) of 39.0 to 39.9 in adult, unspecified obesity type (HCC)  Course: Sonia Beck is currently in the action stage of change. As such, her goal is to continue with weight loss efforts.   Nutrition goals: She has agreed to the Category 1 Plan.   Exercise goals: Start strength training/weight bearing exercises at the senior center.  Behavioral modification strategies: meal planning and cooking strategies, keeping healthy foods in the home and planning for success.  Sonia Beck has agreed to follow-up with our clinic  in 2 weeks with an APP. She was informed of the importance of frequent follow-up visits to maximize her success with intensive lifestyle modifications for her multiple health conditions.   Objective:   Blood pressure (!) 167/82, pulse 69, temperature 97.9 F (36.6 C), height 5' (1.524 m), weight 202 lb (91.6 kg), SpO2 97 %. Body mass index is 39.45 kg/m.  General: Cooperative, alert, well developed, in no acute distress. HEENT: Conjunctivae and lids unremarkable. Cardiovascular: Regular rhythm.  Lungs:  Normal work of breathing. Neurologic: No focal deficits.   Lab Results  Component Value Date   CREATININE 1.0 04/26/2020   BUN 12 04/26/2020   NA 142 04/26/2020   K 4.4 04/26/2020   CL 101 04/26/2020   CO2 30 (A) 04/26/2020   Lab Results  Component Value Date   ALT 24 04/26/2020   AST 20 04/26/2020   ALKPHOS 113 04/26/2020   BILITOT 0.5 01/14/2016   Lab Results  Component Value Date   HGBA1C 6.2 04/26/2020   HGBA1C 6.1 03/13/2007   Lab Results  Component Value Date   INSULIN 9.9 05/02/2020   Lab Results  Component Value Date   TSH 0.821 05/02/2020   Lab Results  Component Value Date   CHOL 194 04/26/2020   HDL 73 (A) 04/26/2020   LDLCALC 109 04/26/2020   TRIG 59 04/26/2020   Lab Results  Component Value Date   WBC 8.0 04/26/2020   HGB 12.5 04/26/2020   HCT 37 04/26/2020   MCV 77.1 (L) 01/14/2016   PLT 473 (A) 04/26/2020   Obesity Behavioral Intervention:   Approximately 15 minutes were spent on the discussion below.  ASK: We discussed the diagnosis of obesity with Sonia Beck today and Sonia Beck agreed to give [place] permission to discuss obesity behavioral modification therapy today.  ASSESS: Sonia Beck has the diagnosis of obesity and her BMI today is 39.4. Sonia Beck is in the action stage of change.   ADVISE: Daizy was educated on the multiple health risks of obesity as well as the benefit of weight loss to improve her health. She was advised of the need for long term treatment and the importance of lifestyle modifications to improve her current health and to decrease her risk of future health problems.  AGREE: Multiple dietary modification options and treatment options were discussed and Sonia Beck agreed to follow the recommendations documented in the above note.  ARRANGE: Sonia Beck was educated on the importance of frequent visits to treat obesity as outlined per CMS and USPSTF guidelines and agreed to schedule her next follow up appointment  today.  Attestation Statements:   Reviewed by clinician on day of visit: allergies, medications, problem list, medical history, surgical history, family history, social history, and previous encounter notes.  I, Insurance claims handler, CMA, am acting as Energy manager for [employer], DO.  I have reviewed the above documentation for accuracy and completeness, and I agree with the above. Carlye Grippe, D.O.  The 21st Century Cures Act was signed into law in 2016 which includes the topic of electronic health records.  This provides immediate access to information in MyChart.  This includes consultation notes, operative notes, office notes, lab results and pathology reports.  If you have any questions about what you read please let us know at your next visit so we can discuss your concerns and take corrective action if need be.  We are right here with you.

## 2020-06-21 ENCOUNTER — Telehealth: Payer: Self-pay | Admitting: Cardiology

## 2020-06-21 ENCOUNTER — Ambulatory Visit (INDEPENDENT_AMBULATORY_CARE_PROVIDER_SITE_OTHER): Payer: Medicare PPO | Admitting: Adult Health

## 2020-06-21 ENCOUNTER — Encounter (INDEPENDENT_AMBULATORY_CARE_PROVIDER_SITE_OTHER): Payer: Self-pay | Admitting: Adult Health

## 2020-06-21 ENCOUNTER — Other Ambulatory Visit: Payer: Self-pay

## 2020-06-21 VITALS — BP 180/90 | HR 54 | Temp 97.6°F | Ht 60.0 in | Wt 202.0 lb

## 2020-06-21 DIAGNOSIS — M159 Polyosteoarthritis, unspecified: Secondary | ICD-10-CM | POA: Diagnosis not present

## 2020-06-21 DIAGNOSIS — I10 Essential (primary) hypertension: Secondary | ICD-10-CM

## 2020-06-21 DIAGNOSIS — R7303 Prediabetes: Secondary | ICD-10-CM | POA: Diagnosis not present

## 2020-06-21 DIAGNOSIS — Z6839 Body mass index (BMI) 39.0-39.9, adult: Secondary | ICD-10-CM

## 2020-06-21 NOTE — Telephone Encounter (Signed)
Pt c/o BP issue: STAT if pt c/o blurred vision, one-sided weakness or slurred speech  1. What are your last 5 BP readings? 06/21/20: 180/92   2. Are you having any other symptoms (ex. Dizziness, headache, blurred vision, passed out)?   3. What is your BP issue? Patient has been going to the [facility]. She went today and saw that her BP was 180/92. She has been going every 2 weeks. She does not have logs but they should be in the office visit notes from the weight management center.  She wanted to know if Dr. Herbie Baltimore wanted her to change her medication. She is taking it as he directed.  Please advise

## 2020-06-21 NOTE — Telephone Encounter (Signed)
Called pt discussed BP issue. She states that her BP has been running at home and at her weight loss appts >160 has not been less than 160 "for quite awhile. Except she will exercise then rest after for 15-30 minutes and it will be perfect at that time, in the 120-130's, she does not remember the bottom number she states that she is not having any sx when her BP is high, no CP, SOB, HA or any other sx. She just would like to mention she has chronic back, bilat shoulder "spurs" and knee pain. She takes her medication as directed, she is just wondering if her medication needs to be adjusted. Please advise

## 2020-06-22 NOTE — Progress Notes (Signed)
Chief Complaint:   OBESITY Sonia Beck is here to discuss her progress with her obesity treatment plan along with follow-up of her obesity related diagnoses. Sonia Beck is on the Category 1 Plan + 100 and states she is following her eating plan approximately 20% of the time. [name] states she worked 4 days.  Today's visit was #: 4 Starting weight: 208 lbs Starting date: 05/02/2020 Today's weight: 202 lbs Today's date: 06/21/2020 Total lbs lost to date: 6 lbs Total lbs lost since last in-office visit: 0  Interim History: Xavier has not decreased sodium/sugar intake. Her BP is elevated and she is experiencing increased diffuse joint pain. SBP at last 3 OV's 160-180. She denies any cardiac symptoms. 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  Subjective:   1. Hypertension, unspecified type Dominque's SBP above goal today. She denies chest pain or dyspnea. She is on amlodipine 10 mg QD, losartan 100 mg QD, triamterene/HCTZ 37.5/25 mg QD. She does not check ambulatory BP.  BP Readings from Last 3 Encounters:  06/21/20 (!) 180/90  06/05/20 (!) 167/82  05/16/20 (!) 165/95    2. Pre-diabetes Sonia Beck's 04/26/2020 A1c 6.2. She is not on Metformin.  Lab Results  Component Value Date   HGBA1C 6.2 04/26/2020   Lab Results  Component Value Date   INSULIN 9.9 05/02/2020    3. Osteoarthritis of multiple joints, unspecified osteoarthritis type Ailani reports increased diffuse pain with increased sugar intake. Worst source of pain R knee. She has bilateral shoulder pain. She is in PT for shoulder and knee mobility. She will use OTC acetaminophen or Ibuprofen for pain relief.  Assessment/Plan:   1. Hypertension, unspecified type Deshante is working on healthy weight loss and exercise to improve blood pressure control. We will watch for signs of hypotension as she continues her lifestyle modifications. Follow up with Dr. Herbie Baltimore at cardiology for uncontrolled HTN. Pt verbalized  understanding/agreement.   2. Pre-diabetes Sonia Beck will continue to work on weight loss, exercise, and decreasing simple carbohydrates to help decrease the risk of diabetes. Consistently follow category 2 and decrease sugars.  3. Osteoarthritis of multiple joints, unspecified osteoarthritis type Follow category 2 and dramatically decrease sugar intake. Continue with PT.  4. Class 2 severe obesity with serious comorbidity and body mass index (BMI) of 39.0 to 39.9 in adult, unspecified obesity type [facility]) Sonia Beck is currently in the action stage of change. As such, her goal is to continue with weight loss efforts. She has agreed to the Category 1 Plan + 100 calories.   Follow up with Dr. Herbie Baltimore at cardiology regarding uncontrolled HTN. Pt verbalized understanding/agreement.   Exercise goals: As is  Behavioral modification strategies: increasing lean protein intake, decreasing sodium intake (!!), no skipping meals, meal planning and cooking strategies and planning for success.  Sonia Beck has agreed to follow-up with our clinic in 2 weeks. She was informed of the importance of frequent follow-up visits to maximize her success with intensive lifestyle modifications for her multiple health conditions.   Objective:   Blood pressure (!) 180/90, pulse (!) 54, temperature 97.6 F (36.4 C), height 5' (1.524 m), weight 202 lb (91.6 kg), SpO2 98 %. Body mass index is 39.45 kg/m.  General: Cooperative, alert, well developed, in no acute distress. HEENT: Conjunctivae and lids unremarkable. Cardiovascular: Regular rhythm.  Lungs: Normal work of breathing. Neurologic: No focal deficits.   Lab Results  Component Value Date   CREATININE 1.0 04/26/2020   BUN 12 04/26/2020   NA 142 04/26/2020  K 4.4 04/26/2020   CL 101 04/26/2020   CO2 30 (A) 04/26/2020   Lab Results  Component Value Date   ALT 24 04/26/2020   AST 20 04/26/2020   ALKPHOS 113 04/26/2020   BILITOT 0.5 01/14/2016   Lab  Results  Component Value Date   HGBA1C 6.2 04/26/2020   HGBA1C 6.1 03/13/2007   Lab Results  Component Value Date   INSULIN 9.9 05/02/2020   Lab Results  Component Value Date   TSH 0.821 05/02/2020   Lab Results  Component Value Date   CHOL 194 04/26/2020   HDL 73 (A) 04/26/2020   LDLCALC 109 04/26/2020   TRIG 59 04/26/2020   Lab Results  Component Value Date   WBC 8.0 04/26/2020   HGB 12.5 04/26/2020   HCT 37 04/26/2020   MCV 77.1 (L) 01/14/2016   PLT 473 (A) 04/26/2020    Attestation Statements:   Reviewed by clinician on day of visit: allergies, medications, problem list, medical history, surgical history, family history, social history, and previous encounter notes.  Time spent on visit including pre-visit chart review and post-visit care and charting was 32 minutes.   Edmund Hilda, am acting as Energy manager for Sonia Hamburger, NP.  I have reviewed the above documentation for accuracy and completeness, and I agree with the above. -  Kailer Heindel d. Sanay Belmar, NP-C

## 2020-07-03 ENCOUNTER — Other Ambulatory Visit: Payer: Self-pay

## 2020-07-03 ENCOUNTER — Encounter (INDEPENDENT_AMBULATORY_CARE_PROVIDER_SITE_OTHER): Payer: Self-pay | Admitting: Family Medicine

## 2020-07-03 ENCOUNTER — Ambulatory Visit (INDEPENDENT_AMBULATORY_CARE_PROVIDER_SITE_OTHER): Payer: Medicare PPO | Admitting: Family Medicine

## 2020-07-03 VITALS — BP 170/89 | HR 85 | Temp 97.8°F | Ht 60.0 in | Wt 198.0 lb

## 2020-07-03 DIAGNOSIS — I10 Essential (primary) hypertension: Secondary | ICD-10-CM

## 2020-07-03 DIAGNOSIS — E559 Vitamin D deficiency, unspecified: Secondary | ICD-10-CM

## 2020-07-03 DIAGNOSIS — Z6841 Body Mass Index (BMI) 40.0 and over, adult: Secondary | ICD-10-CM | POA: Diagnosis not present

## 2020-07-03 MED ORDER — VITAMIN D (ERGOCALCIFEROL) 1.25 MG (50000 UNIT) PO CAPS: 50000.0000 [IU] | ORAL_CAPSULE | ORAL | 0 refills | Status: DC

## 2020-07-07 NOTE — Telephone Encounter (Signed)
Left message for pt to call and confirm appointment with pharm md 4/5 @ 8 am.

## 2020-07-07 NOTE — Telephone Encounter (Signed)
Pressure medicine is probably need to be titrated up, but we need to see her in clinic to do so.   Can be seen by either me, APP or [facility] pharmacists

## 2020-07-07 NOTE — Telephone Encounter (Signed)
Called patient, LVM to verify upcoming appointment to discuss blood pressure issues.  Left call back number.

## 2020-07-11 ENCOUNTER — Ambulatory Visit (INDEPENDENT_AMBULATORY_CARE_PROVIDER_SITE_OTHER): Payer: Medicare PPO | Admitting: Pharmacist Clinician (PhC)/ Clinical Pharmacy Specialist

## 2020-07-11 ENCOUNTER — Other Ambulatory Visit: Payer: Self-pay

## 2020-07-11 DIAGNOSIS — I10 Essential (primary) hypertension: Secondary | ICD-10-CM

## 2020-07-11 MED ORDER — OLMESARTAN MEDOXOMIL 20 MG PO TABS: 20.0000 mg | ORAL_TABLET | Freq: Every day | ORAL | 5 refills | Status: DC

## 2020-07-11 NOTE — Progress Notes (Signed)
Chief Complaint:   OBESITY Sonia Beck is here to discuss her progress with her obesity treatment plan along with follow-up of her obesity related diagnoses.   Today's visit was #: 5 Starting weight: 208 lbs Starting date: 05/02/2020 Today's weight: 198 Today's date: 07/03/2020 Total lbs lost to date: 10 lbs Body mass index is 38.67 kg/m.  Total weight loss percentage to date: -4.81%  Interim History:  Sonia Beck's birthday is today.  She celebrated a little over the weekend at [employer].  Plan:  She is going to a women's retreat in the near future and wants to talk strategies for eating.  Current Meal Plan: the Category 1 Plan +100 calories.  Current Exercise Plan: Chair exercises for 45 minutes 2 times per week.  Assessment/Plan:   No orders of the defined types were placed in this encounter.   Medications Discontinued During This Encounter  Medication Reason  . Vitamin D, Ergocalciferol, (DRISDOL) 1.25 MG (50000 UNIT) CAPS capsule Reorder     Meds ordered this encounter  Medications  . Vitamin D, Ergocalciferol, (DRISDOL) 1.25 MG (50000 UNIT) CAPS capsule    Sig: Take 1 capsule (50,000 Units total) by mouth every 7 (seven) days. (OV for RF)    Dispense:  4 capsule    Refill:  0     1. Essential hypertension Not at goal. Medications: Norvasc 10 mg daily, Benicar 20 mg daily, Maxzide 37.5-25 mg daily.  She is not checking at home.  She called her cardiologist and they readjusted some of her blood pressure medications.  She has elevated blood pressure with increased anxiety over her office visits.  Plan:  Continue medications per Cardiology with a goal of 140/90, but not above 150 systolic on a regular basis.  Decrease salt intake, increase exercise, continue prudent nutritional plan and weight loss.   BP Readings from Last 3 Encounters:  07/19/20 (!) 155/85  07/11/20 (!) 152/86  07/03/20 (!) 170/89   Lab Results  Component Value Date   CREATININE 1.0 04/26/2020    2. Vitamin D deficiency Not at goal. Current vitamin D is 20.9, tested on 05/02/2020. Optimal goal > 50 ng/dL.  She is taking vitamin D 50,000 IU weekly.  Plan: Continue to take prescription Vitamin D @50 ,000 IU every week as prescribed.  Follow-up for routine testing of Vitamin D, at least 2-3 times per year to avoid over-replacement.  - Refill Vitamin D, Ergocalciferol, (DRISDOL) 1.25 MG (50000 UNIT) CAPS capsule; Take 1 capsule (50,000 Units total) by mouth every 7 (seven) days. (OV for RF)  Dispense: 4 capsule; Refill: 0  3. Obesity, current BMI 38.7  Course: Sonia Beck is currently in the action stage of change. As such, her goal is to continue with weight loss efforts.   Nutrition goals: She has agreed to the Category 1 Plan with breakfast options +100 calories.   Exercise goals: Older adults should follow the adult guidelines. When older adults cannot meet the adult guidelines, they should be as physically active as their abilities and conditions will allow.  Older adults should do exercises that maintain or improve balance if they are at risk of falling.   Behavioral modification strategies: meal planning and cooking strategies and planning for success.  Sonia Beck has agreed to follow-up with our clinic in 2-3 weeks. She was informed of the importance of frequent follow-up visits to maximize her success with intensive lifestyle modifications for her multiple health conditions.   Objective:   Blood pressure (!) 170/89, pulse 85,  temperature 97.8 F (36.6 C), height 5' (1.524 m), weight 198 lb (89.8 kg), SpO2 99 %. Body mass index is 38.67 kg/m.  General: Cooperative, alert, well developed, in no acute distress. HEENT: Conjunctivae and lids unremarkable. Cardiovascular: Regular rhythm.  Lungs: Normal work of breathing. Neurologic: No focal deficits.   Lab Results  Component Value Date   CREATININE 1.0 04/26/2020   BUN 12 04/26/2020   NA 142 04/26/2020   K 4.4 04/26/2020    CL 101 04/26/2020   CO2 30 (A) 04/26/2020   Lab Results  Component Value Date   ALT 24 04/26/2020   AST 20 04/26/2020   ALKPHOS 113 04/26/2020   BILITOT 0.5 01/14/2016   Lab Results  Component Value Date   HGBA1C 6.2 04/26/2020   HGBA1C 6.1 03/13/2007   Lab Results  Component Value Date   INSULIN 9.9 05/02/2020   Lab Results  Component Value Date   TSH 0.821 05/02/2020   Lab Results  Component Value Date   CHOL 194 04/26/2020   HDL 73 (A) 04/26/2020   LDLCALC 109 04/26/2020   TRIG 59 04/26/2020   Lab Results  Component Value Date   WBC 8.0 04/26/2020   HGB 12.5 04/26/2020   HCT 37 04/26/2020   MCV 77.1 (L) 01/14/2016   PLT 473 (A) 04/26/2020   Obesity Behavioral Intervention:   Approximately 15 minutes were spent on the discussion below.  ASK: We discussed the diagnosis of obesity with Sonia Beck today and Sonia Beck agreed to give [place] permission to discuss obesity behavioral modification therapy today.  ASSESS: Sonia Beck has the diagnosis of obesity and her BMI today is 38.7. Sonia Beck is in the action stage of change.   ADVISE: Sonia Beck was educated on the multiple health risks of obesity as well as the benefit of weight loss to improve her health. She was advised of the need for long term treatment and the importance of lifestyle modifications to improve her current health and to decrease her risk of future health problems.  AGREE: Multiple dietary modification options and treatment options were discussed and Sonia Beck agreed to follow the recommendations documented in the above note.  ARRANGE: Sonia Beck was educated on the importance of frequent visits to treat obesity as outlined per CMS and USPSTF guidelines and agreed to schedule her next follow up appointment today.  Attestation Statements:   Reviewed by clinician on day of visit: allergies, medications, problem list, medical history, surgical history, family history, social history, and previous encounter  notes.  I, Insurance claims handler, CMA, am acting as Energy manager for [employer], DO.  I have reviewed the above documentation for accuracy and completeness, and I agree with the above. Sonia Beck, D.O.  The 21st Century Cures Act was signed into law in 2016 which includes the topic of electronic health records.  This provides immediate access to information in MyChart.  This includes consultation notes, operative notes, office notes, lab results and pathology reports.  If you have any questions about what you read please let us know at your next visit so we can discuss your concerns and take corrective action if need be.  We are right here with you.

## 2020-07-11 NOTE — Assessment & Plan Note (Signed)
Patient with essential hypertension. Not necessarily white coat, as she thinks it goes up at home when she checks as well.   Will have her switch losartan 100 mg for olmesartan 20 mg once daily and continue with other medications.  She was encouraged to start walking short distances 10-20 minutes at least twice weekly and praised on her recent joining exercise group at [facility].  I asked her not to check pressure at home for the next several weeks, but she should bring her cuff to next appointment.  We can check it for accuracy and hopefully if she starts seeing readings in the office that are WNL she will get more comfortable with the idea of checking home readings.

## 2020-07-11 NOTE — Patient Instructions (Signed)
Return for a a follow up appointment May 10 at 10 am  Take your BP meds as follows:  STOP LOSARTAN  START OLMESARTAN 20 MG ONCE DAILY  CONTINUE WITH ALL OTHER MEDICATIONS  Bring all of your meds, your BP cuff and your record of home blood pressures to your next appointment.  Exercise as you're able, try to walk approximately 30 minutes per day.  Keep salt intake to a minimum, especially watch canned and prepared boxed foods.  Eat more fresh fruits and vegetables and fewer canned items.  Avoid eating in fast food restaurants.    HOW TO TAKE YOUR BLOOD PRESSURE: . Rest 5 minutes before taking your blood pressure. .  Don't smoke or drink caffeinated beverages for at least 30 minutes before. . Take your blood pressure before (not after) you eat. . Sit comfortably with your back supported and both feet on the floor (don't cross your legs). . Elevate your arm to heart level on a table or a desk. . Use the proper sized cuff. It should fit smoothly and snugly around your bare upper arm. There should be enough room to slip a fingertip under the cuff. The bottom edge of the cuff should be 1 inch above the crease of the elbow. . Ideally, take 3 measurements at one sitting and record the average.

## 2020-07-11 NOTE — Progress Notes (Signed)
07/11/2020 Sonia Beck Sep 28, 1945 [id number]   HPI:  Sonia Beck is a 75 y.o. female patient of Dr Herbie Baltimore, who presents today for hypertension clinic evaluation.  In addition to hypertension her medical history is significant for hyperlipidemia, vitamin D deficiency and obesity.   She saw Dr. Herbie Baltimore last in August of 21 at which time her pressure was elevated at 152/84.  She is also followed by [facility], and chart indicates she has history of white coat hypertension.   Today she admits that the idea of checking her blood pressure causes her to be nervous.  She has quit checking it at home because of the anxiety it causes.  She has problems with chronic osteoarthritis and takes 600 mg of ibuprofen 4-5 days of the week.  Otherwise she will take acetaminophen or use a topical agent to help with the pain.  She is also currently raising her 56 year old grandson, but states that he is a joy and doesn't cause her any stress.    Blood Pressure Goal:  130/80  Current Medications: losartan 100 mg qd - 4 pm, amlodipine 10 mg qd - am, triamterene/hctz 37.5/25 qd - am  Family Hx: mgm was patient of Dr. Elsie Lincoln, died at 73 of non-cardiac issues; mother died 53 aortic aneurysm and htn, older brother died from massive MI at 52; father died from brain cancer at 75, no cardiac disease; no biological children  Social Hx: no, no, no coffee, occasional decaf tea (unsweetened); only rare soda  Diet: working with [employer]; drinks mostly water (plain and flavored); on high protein diet, has cut salt out for Ms. Dash, but does admit to some foods being higher in sodium;  admits to enjoying sweets, has not been able to cut out completely  Exercise: friend works for [employer] - getting 1 on 1 PT once weekly; just re-joined [facility] - does senior arthritis chair exercise BIW;   Home BP readings: none with her, states that she gets anxious about checking  pressure at home  Intolerances: no cardiac medication intolerances  Labs:  1/22: Na 142, K 4.4, Glu 90, BUN 12, SCr 1.0, GFR 69.7   Wt Readings from Last 3 Encounters:  07/11/20 198 lb (89.8 kg)  07/03/20 198 lb (89.8 kg)  06/21/20 202 lb (91.6 kg)   BP Readings from Last 3 Encounters:  07/11/20 (!) 152/86  07/03/20 (!) 170/89  06/21/20 (!) 180/90   Pulse Readings from Last 3 Encounters:  07/11/20 80  07/03/20 85  06/21/20 (!) 54    Current Outpatient Medications  Medication Sig Dispense Refill  . acetaminophen (TYLENOL) 500 MG tablet Take 500 mg by mouth as needed. TAKE 2 TABLET AS NEEDED    . ALPRAZolam (XANAX) 0.25 MG tablet Take 0.25 mg by mouth 2 (two) times daily as needed for anxiety.     [name] Kitchen amLODipine (NORVASC) 10 MG tablet Take 10 mg by mouth daily.     . calcium-vitamin D (OSCAL WITH D) 500-200 MG-UNIT per tablet Take 1 tablet by mouth daily.    [name] Kitchen FLUoxetine (PROZAC) 20 MG capsule Take 20 mg by mouth daily.    [name] Kitchen ibuprofen (ADVIL) 200 MG tablet Take 200 mg by mouth as needed. TAKE  400 MG  TO 600 MG AS NEEDED    . losartan (COZAAR) 100 MG tablet Take 1 tablet (100 mg total) by mouth daily. as directed 90 tablet 3  . montelukast (SINGULAIR)  10 MG tablet Take 10 mg by mouth at bedtime.    . ondansetron (ZOFRAN) 8 MG tablet Take 8 mg by mouth every 8 (eight) hours as needed for nausea or vomiting.     . pantoprazole (PROTONIX) 40 MG tablet     . potassium chloride SA (K-DUR,KLOR-CON) 20 MEQ tablet Take 20 mEq by mouth daily.    . rosuvastatin (CRESTOR) 5 MG tablet Take 5 mg by mouth every Sunday.     . triamterene-hydrochlorothiazide (MAXZIDE-25) 37.5-25 MG per tablet Take 1 tablet by mouth daily.    . Vitamin D, Ergocalciferol, (DRISDOL) 1.25 MG (50000 UNIT) CAPS capsule Take 1 capsule (50,000 Units total) by mouth every 7 (seven) days. (OV for RF) 4 capsule 0   No current facility-administered medications for this visit.    Allergies  Allergen Reactions  .  Erythromycin Other (See Comments)    Stomach cramps.  . Fosamax [Alendronate] Other (See Comments)    Joint pain.    Past Medical History:  Diagnosis Date  . Anxiety   . Anxiety   . Asthma   . Back pain   . Bone spur    bilateral shoulders  . Cardiac murmur Echo October 2013   Aortic sclerosis, not stenosis  . Chronic renal failure syndrome, stage 2 (mild)   . Depression   . Depression   . Eczema   . Gout   . Heartburn   . Hyperlipidemia   . Hypertension   . Joint pain   . Obesity (BMI 30-39.9)    Former weight 224 pounds with BMI of 41 -- 65 pound weight loss in one year  . Osteoarthritis   . Prediabetes   . SOBOE (shortness of breath on exertion)   . Thrombocytopenia (HCC)     Blood pressure (!) 152/86, pulse 80, resp. rate 16, height 5' (1.524 m), weight 198 lb (89.8 kg), SpO2 98 %.  Essential hypertension Patient with essential hypertension. Not necessarily white coat, as she thinks it goes up at home when she checks as well.   Will have her switch losartan 100 mg for olmesartan 20 mg once daily and continue with other medications.  She was encouraged to start walking short distances 10-20 minutes at least twice weekly and praised on her recent joining exercise group at [place] [facility].  I asked her not to check pressure at home for the next several weeks, but she should bring her cuff to next appointment.  We can check it for accuracy and hopefully if she starts seeing readings in the office that are WNL she will get more comfortable with the idea of checking home readings.     Phillips Hay PharmD CPP [facility] [address] [place], [place] [address] [phone]

## 2020-07-19 ENCOUNTER — Other Ambulatory Visit: Payer: Self-pay

## 2020-07-19 ENCOUNTER — Ambulatory Visit (HOSPITAL_COMMUNITY)
Admission: EM | Admit: 2020-07-19 | Discharge: 2020-07-19 | Disposition: A | Discharge status: Home / Self Care | Payer: Medicare PPO | Attending: Emergency Medicine | Admitting: Emergency Medicine

## 2020-07-19 ENCOUNTER — Encounter (HOSPITAL_COMMUNITY): Payer: Self-pay | Admitting: Emergency Medicine

## 2020-07-19 DIAGNOSIS — S30860A Insect bite (nonvenomous) of lower back and pelvis, initial encounter: Secondary | ICD-10-CM | POA: Diagnosis not present

## 2020-07-19 DIAGNOSIS — W57XXXA Bitten or stung by nonvenomous insect and other nonvenomous arthropods, initial encounter: Secondary | ICD-10-CM | POA: Diagnosis not present

## 2020-07-19 MED ORDER — DOXYCYCLINE HYCLATE 100 MG PO CAPS: 200.0000 mg | ORAL_CAPSULE | Freq: Once | ORAL | 0 refills | Status: AC

## 2020-07-19 NOTE — ED Provider Notes (Signed)
[facility]    CSN: [id number] Arrival date & time: 07/19/20  [id number]      History   Chief Complaint Chief Complaint  Patient presents with  . Insect Bite  . Rash    HPI Sonia Beck is a 75 y.o. female.   Patient presents with concerns of tick bite to the left buttock. This morning while bathing she noticed an itchy bump on her left buttock and states it felt like a tick. She was not able to visualize the area well or try to remove the tick. She reports it is itchy, not painful, and after she noticed it and worried about it now it is more bothersome. She does not know how long it has been there, and denies recent time in the woods or anything. She denies fever, fatigue, or unusual body aches aside from her usual arthritis pains. She denies rash elsewhere, though asks to have another spot on her trunk looked at that she noticed recently.   The history is provided by the patient.    Past Medical History:  Diagnosis Date  . Anxiety   . Anxiety   . Asthma   . Back pain   . Bone spur    bilateral shoulders  . Cardiac murmur Echo October 2013   Aortic sclerosis, not stenosis  . Chronic renal failure syndrome, stage 2 (mild)   . Depression   . Depression   . Eczema   . Gout   . Heartburn   . Hyperlipidemia   . Hypertension   . Joint pain   . Obesity (BMI 30-39.9)    Former weight 224 pounds with BMI of 41 -- 65 pound weight loss in one year  . Osteoarthritis   . Prediabetes   . [place] spotted fever 2021  . SOBOE (shortness of breath on exertion)   . Thrombocytopenia [facility](HCC)     Patient Active Problem List   Diagnosis Date Noted  . Osteoarthritis of multiple joints 06/21/2020  . Vitamin D deficiency 05/16/2020  . Other fatigue 05/02/2020  . SOBOE (shortness of breath on exertion) 05/02/2020  . Hypertension 05/02/2020  . Prediabetes 05/02/2020  . Other hyperlipidemia 05/02/2020  . Mood disorder (HCC) with emotional eating 05/02/2020  . At  risk for heart disease 05/02/2020  . Family history of abdominal aortic aneurysm (AAA) 10/02/2016  . Class 2 severe obesity with serious comorbidity and body mass index (BMI) of 39.0 to 39.9 in adult (HCC) 11/13/2012  . Cardiac murmur   . Essential hypertension   . Hyperlipidemia due to dietary fat intake     Past Surgical History:  Procedure Laterality Date  . ABDOMINAL HYSTERECTOMY  1991  . COLONOSCOPY  2021  . DOPPLER ECHOCARDIOGRAPHY  10/2016   mild LVH.  Focal basal.  EF 55 to 60%.  No R WMA.  GR 1 DD.  Mildly elevated PA pressures of 33 mmHg.  No obvious valve lesion to explain murmur  . DOPPLER ECHOCARDIOGRAPHY  01/16/2012   ef =>55% lv norm  . NM MYOCAR PERF WALL MOTION  01/30/2001   EF 69% ,LOW RISK,mild ant wall thinning due to breast attenuation    OB History    Gravida  0   Para  0   Term  0   Preterm  0   AB  0   Living  0     SAB  0   IAB  0   Ectopic  0   Multiple  0  Live Births  0            Home Medications    Prior to Admission medications   Medication Sig Start Date End Date Taking? Authorizing Provider  doxycycline (VIBRAMYCIN) 100 MG capsule Take 2 capsules (200 mg total) by mouth once for 1 dose. 07/19/20 07/19/20 Yes Everette Dimauro L, PA  acetaminophen (TYLENOL) 500 MG tablet Take 500 mg by mouth as needed. TAKE 2 TABLET AS NEEDED    [provider]  ALPRAZolam (XANAX) 0.25 MG tablet Take 0.25 mg by mouth 2 (two) times daily as needed for anxiety.     [provider]  amLODipine (NORVASC) 10 MG tablet Take 10 mg by mouth daily.  11/01/14   [provider]  calcium-vitamin D (OSCAL WITH D) 500-200 MG-UNIT per tablet Take 1 tablet by mouth daily.    [provider]  FLUoxetine (PROZAC) 20 MG capsule Take 20 mg by mouth daily.    [provider]  ibuprofen (ADVIL) 200 MG tablet Take 200 mg by mouth as needed. TAKE  400 MG  TO 600 MG AS NEEDED    [provider]  montelukast (SINGULAIR)  10 MG tablet Take 10 mg by mouth at bedtime.    [provider]  olmesartan (BENICAR) 20 MG tablet Take 1 tablet (20 mg total) by mouth daily. 07/11/20   Marykay Lex, MD  ondansetron (ZOFRAN) 8 MG tablet Take 8 mg by mouth every 8 (eight) hours as needed for nausea or vomiting.  08/15/14   [provider]  pantoprazole (PROTONIX) 40 MG tablet  02/13/20   [provider]  potassium chloride SA (K-DUR,KLOR-CON) 20 MEQ tablet Take 20 mEq by mouth daily.    [provider]  rosuvastatin (CRESTOR) 5 MG tablet Take 5 mg by mouth every Sunday.     [provider]  triamterene-hydrochlorothiazide (MAXZIDE-25) 37.5-25 MG per tablet Take 1 tablet by mouth daily.    [provider]  Vitamin D, Ergocalciferol, (DRISDOL) 1.25 MG (50000 UNIT) CAPS capsule Take 1 capsule (50,000 Units total) by mouth every 7 (seven) days. (OV for RF) 07/03/20   Thomasene Lot, DO    Family History Family History  Problem Relation Age of Onset  . Heart disease Mother   . Hypertension Mother   . Hyperlipidemia Mother   . Cancer Father   . Heart attack Brother 11    Social History Social History   Tobacco Use  . Smoking status: Never Smoker  . Smokeless tobacco: Never Used  Vaping Use  . Vaping Use: Never used  Substance Use Topics  . Alcohol use: No  . Drug use: No     Allergies   Erythromycin and Fosamax [alendronate]   Review of Systems Review of Systems  Constitutional: Negative for fatigue and fever.  Gastrointestinal: Negative for nausea and vomiting.  Musculoskeletal: Negative for myalgias.  Skin: Positive for rash.  Neurological: Negative for dizziness, weakness, numbness and headaches.     Physical Exam Triage Vital Signs ED Triage Vitals  Enc Vitals Group     BP 07/19/20 0948 (!) 155/85     Pulse Rate 07/19/20 0948 79     Resp 07/19/20 0948 20     Temp 07/19/20 0948 98 F (36.7 C)     Temp Source 07/19/20 0948 Oral     SpO2  07/19/20 0948 95 %     Weight --      Height --  Head Circumference --      Peak Flow --      Pain Score 07/19/20 0946 0     Pain Loc --      Pain Edu? --      Excl. in GC? --    No data found.  Updated Vital Signs BP (!) 155/85 (BP Location: Right Arm)   Pulse 79   Temp 98 F (36.7 C) (Oral)   Resp 20   SpO2 95%   Visual Acuity Right Eye Distance:   Left Eye Distance:   Bilateral Distance:    Right Eye Near:   Left Eye Near:    Bilateral Near:     Physical Exam Vitals and nursing note reviewed.  Constitutional:      General: She is not in acute distress. HENT:     Head: Normocephalic.  Eyes:     Pupils: Pupils are equal, round, and reactive to light.  Cardiovascular:     Rate and Rhythm: Normal rate and regular rhythm.     Heart sounds: Normal heart sounds.  Pulmonary:     Effort: Pulmonary effort is normal.     Breath sounds: Normal breath sounds.  Skin:    Comments: Left medial upper buttock with tick in place. Mild erythema and swelling around tick location, approx 1cm diameter area.   Patient points to other concerned lesion - Right abdomen with 59mm diameter seborrheic keratosis.   Neurological:     General: No focal deficit present.     Mental Status: She is alert.  Psychiatric:        Mood and Affect: Mood normal.      UC Treatments / Results  Labs (all labs ordered are listed, but only abnormal results are displayed) Labs Reviewed - No data to display  EKG   Radiology No results found.  Procedures Foreign Body Removal  Date/Time: 07/19/2020 11:01 AM Performed by: Estanislado Pandy, PA Authorized by: Estanislado Pandy, PA   Consent:    Consent obtained:  Verbal   Consent given by:  Patient   Risks, benefits, and alternatives were discussed: yes     Risks discussed:  Bleeding, incomplete removal and pain Universal protocol:    Procedure explained and questions answered to patient or proxy's satisfaction: yes     Patient identity  confirmed:  Verbally with patient Location:    Location:  Pelvis   Pelvis location:  L buttock   Depth:  Intradermal (tick embeded) Anesthesia:    Anesthesia method:  None Procedure details:    Incision length:  None   Localization method:  Visualized   Bloodless field: yes     Removal mechanism:  Forceps   Intact foreign body removal: yes   Post-procedure details:    Neurovascular status: intact     Confirmation:  No additional foreign bodies on visualization   Skin closure:  None   Dressing:  Adhesive bandage   Procedure completion:  Tolerated Comments:     Tick removed in entirety - appears to be lone star tick.    (including critical care time)  Medications Ordered in UC Medications - No data to display  Initial Impression / Assessment and Plan / UC Course  I have reviewed the triage vital signs and the nursing notes.  Pertinent labs & imaging results that were available during my care of the patient were reviewed by me and considered in my medical decision making (see chart for details).     Tick  removal. Reassurance regarding other lesion. Will provide prophylactic doxycycline dose given unknown duration of retained tick.   Final Clinical Impressions(s) / UC Diagnoses   Final diagnoses:  Tick bite of pelvic region, initial encounter     Discharge Instructions     Tick removed today. Take doxycycline as prescribed to prevent possible tick-borne infection. Follow up with PCP or return here if develop fever, body aches, rash, or other concerning symptoms.       ED Prescriptions    Medication Sig Dispense Auth. Provider   doxycycline (VIBRAMYCIN) 100 MG capsule Take 2 capsules (200 mg total) by mouth once for 1 dose. 2 capsule Vallery Sa, Keshona Kartes L, PA     PDMP not reviewed this encounter.   Estanislado Pandy, [place] 07/19/20 1105

## 2020-07-19 NOTE — ED Triage Notes (Signed)
Pt presents today with c/o of possible insect bite to left buttock felt today. She is also concerned about area below right breast.

## 2020-07-19 NOTE — Discharge Instructions (Addendum)
Tick removed today. Take doxycycline as prescribed to prevent possible tick-borne infection. Follow up with PCP or return here if develop fever, body aches, rash, or other concerning symptoms.

## 2020-07-31 ENCOUNTER — Ambulatory Visit (INDEPENDENT_AMBULATORY_CARE_PROVIDER_SITE_OTHER): Payer: Medicare PPO | Admitting: Family Medicine

## 2020-07-31 ENCOUNTER — Other Ambulatory Visit: Payer: Self-pay

## 2020-07-31 ENCOUNTER — Encounter (INDEPENDENT_AMBULATORY_CARE_PROVIDER_SITE_OTHER): Payer: Self-pay | Admitting: Family Medicine

## 2020-07-31 VITALS — BP 140/83 | HR 81 | Temp 98.0°F | Ht 60.0 in | Wt 201.0 lb

## 2020-07-31 DIAGNOSIS — E559 Vitamin D deficiency, unspecified: Secondary | ICD-10-CM

## 2020-07-31 DIAGNOSIS — I10 Essential (primary) hypertension: Secondary | ICD-10-CM | POA: Diagnosis not present

## 2020-07-31 DIAGNOSIS — Z6841 Body Mass Index (BMI) 40.0 and over, adult: Secondary | ICD-10-CM | POA: Diagnosis not present

## 2020-07-31 MED ORDER — VITAMIN D (ERGOCALCIFEROL) 1.25 MG (50000 UNIT) PO CAPS: 50000.0000 [IU] | ORAL_CAPSULE | ORAL | 0 refills | Status: DC

## 2020-08-02 NOTE — Progress Notes (Signed)
Chief Complaint:   OBESITY Sonia Beck is here to discuss her progress with her obesity treatment plan along with follow-up of her obesity related diagnoses.   Today's visit was #: 6 Starting weight: 208 lbs Starting date: 05/02/2020 Today's weight: 201 lbs Today's date: 07/31/2020 Total lbs lost to date: 7 lbs Body mass index is 39.26 kg/m.  Total weight loss percentage to date: -3.37%  Interim History:  Sonia Beck is here for a follow up office visit and she is following the meal plan without concerns or issues.  Patient's meal and food recall appears to be accurate and consistent with what is on the plan.  When on plan, her hunger and cravings are well controlled.    Current Meal Plan: the Category 1 Plan with breakfast options +100 calories for 20% of the time.  Current Exercise Plan: Chair exercises and walking for 45 minutes 2 times per week (arthritis)..  Assessment/Plan:   1. Essential hypertension Improving, but not optimized. Medications: Norvasc 10 mg daily.  Went to Hypertension Clinic at [facility].  Blood pressure medications were changed from losartan to olmesartan.   Plan:  Continue medications per Cardiology.  Avoid buying foods that are: processed, frozen, or prepackaged to avoid excess salt. Ambulatory blood pressure monitoring was encouraged with a goal of at least 2-3 times weekly or when feeling poorly.  She was instructed to keep a log for [place] to review at each office visit.   We will continue to monitor closely alongside her PCP and/or Specialist.  Regular follow up with PCP and specialists was also encouraged.   BP Readings from Last 3 Encounters:  07/31/20 140/83  07/19/20 (!) 155/85  07/11/20 (!) 152/86   Lab Results  Component Value Date   CREATININE 1.0 04/26/2020   2. Vitamin D deficiency Not at goal. Current vitamin D is 20.9, tested on 05/02/2020. Optimal goal > 50 ng/dL.  She is taking vitamin D 50,000 IU weekly.  Plan: Continue to take  prescription Vitamin D @50 ,000 IU every week as prescribed.  Follow-up for routine testing of Vitamin D, at least 2-3 times per year to avoid over-replacement.  - Refill Vitamin D, Ergocalciferol, (DRISDOL) 1.25 MG (50000 UNIT) CAPS capsule; Take 1 capsule (50,000 Units total) by mouth every 7 (seven) days. (OV for RF)  Dispense: 4 capsule; Refill: 0  3. Obesity, current BMI 39.3  Course: Sonia Beck is currently in the action stage of change. As such, her goal is to continue with weight loss efforts.   Nutrition goals: She has agreed to the Category 1 Plan with breakfast options +100 calories.   Exercise goals: As is.  Behavioral modification strategies: increasing lean protein intake, decreasing simple carbohydrates and planning for success.  Sonia Beck has agreed to follow-up with our clinic in 2 weeks. She was informed of the importance of frequent follow-up visits to maximize her success with intensive lifestyle modifications for her multiple health conditions.   Objective:   Blood pressure 140/83, pulse 81, temperature 98 F (36.7 C), height 5' (1.524 m), weight 201 lb (91.2 kg), SpO2 97 %. Body mass index is 39.26 kg/m.  General: Cooperative, alert, well developed, in no acute distress. HEENT: Conjunctivae and lids unremarkable. Cardiovascular: Regular rhythm.  Lungs: Normal work of breathing. Neurologic: No focal deficits.   Lab Results  Component Value Date   CREATININE 1.0 04/26/2020   BUN 12 04/26/2020   NA 142 04/26/2020   K 4.4 04/26/2020   CL 101 04/26/2020  CO2 30 (A) 04/26/2020   Lab Results  Component Value Date   ALT 24 04/26/2020   AST 20 04/26/2020   ALKPHOS 113 04/26/2020   BILITOT 0.5 01/14/2016   Lab Results  Component Value Date   HGBA1C 6.2 04/26/2020   HGBA1C 6.1 03/13/2007   Lab Results  Component Value Date   INSULIN 9.9 05/02/2020   Lab Results  Component Value Date   TSH 0.821 05/02/2020   Lab Results  Component Value Date   CHOL  194 04/26/2020   HDL 73 (A) 04/26/2020   LDLCALC 109 04/26/2020   TRIG 59 04/26/2020   Lab Results  Component Value Date   WBC 8.0 04/26/2020   HGB 12.5 04/26/2020   HCT 37 04/26/2020   MCV 77.1 (L) 01/14/2016   PLT 473 (A) 04/26/2020   Obesity Behavioral Intervention:   Approximately 15 minutes were spent on the discussion below.  ASK: We discussed the diagnosis of obesity with Claris Che today and Aeryn agreed to give [place] permission to discuss obesity behavioral modification therapy today.  ASSESS: Sonia Beck has the diagnosis of obesity and her BMI today is 39.3. Sonia Beck is in the action stage of change.   ADVISE: Akshita was educated on the multiple health risks of obesity as well as the benefit of weight loss to improve her health. She was advised of the need for long term treatment and the importance of lifestyle modifications to improve her current health and to decrease her risk of future health problems.  AGREE: Multiple dietary modification options and treatment options were discussed and Sonia Beck agreed to follow the recommendations documented in the above note.  ARRANGE: Sonia Beck was educated on the importance of frequent visits to treat obesity as outlined per CMS and USPSTF guidelines and agreed to schedule her next follow up appointment today.  Attestation Statements:   Reviewed by clinician on day of visit: allergies, medications, problem list, medical history, surgical history, family history, social history, and previous encounter notes.  I, Insurance claims handler, CMA, am acting as Energy manager for [employer], DO.  I have reviewed the above documentation for accuracy and completeness, and I agree with the above. Carlye Grippe, D.O.  The 21st Century Cures Act was signed into law in 2016 which includes the topic of electronic health records.  This provides immediate access to information in MyChart.  This includes consultation notes, operative notes,  office notes, lab results and pathology reports.  If you have any questions about what you read please let us know at your next visit so we can discuss your concerns and take corrective action if need be.  We are right here with you.

## 2020-08-09 DIAGNOSIS — Z20822 Contact with and (suspected) exposure to covid-19: Secondary | ICD-10-CM | POA: Diagnosis not present

## 2020-08-14 ENCOUNTER — Encounter (INDEPENDENT_AMBULATORY_CARE_PROVIDER_SITE_OTHER): Payer: Self-pay | Admitting: Family Medicine

## 2020-08-14 ENCOUNTER — Ambulatory Visit (INDEPENDENT_AMBULATORY_CARE_PROVIDER_SITE_OTHER): Payer: Medicare PPO | Admitting: Family Medicine

## 2020-08-14 ENCOUNTER — Other Ambulatory Visit: Payer: Self-pay

## 2020-08-14 VITALS — BP 140/84 | HR 86 | Temp 98.0°F | Ht 60.0 in | Wt 195.0 lb

## 2020-08-14 DIAGNOSIS — I10 Essential (primary) hypertension: Secondary | ICD-10-CM | POA: Diagnosis not present

## 2020-08-14 DIAGNOSIS — Z6841 Body Mass Index (BMI) 40.0 and over, adult: Secondary | ICD-10-CM | POA: Diagnosis not present

## 2020-08-14 DIAGNOSIS — E559 Vitamin D deficiency, unspecified: Secondary | ICD-10-CM | POA: Diagnosis not present

## 2020-08-14 MED ORDER — VITAMIN D (ERGOCALCIFEROL) 1.25 MG (50000 UNIT) PO CAPS: 50000.0000 [IU] | ORAL_CAPSULE | ORAL | 0 refills | Status: DC

## 2020-08-15 ENCOUNTER — Ambulatory Visit (INDEPENDENT_AMBULATORY_CARE_PROVIDER_SITE_OTHER): Payer: Medicare PPO | Admitting: Pharmacist

## 2020-08-15 ENCOUNTER — Other Ambulatory Visit: Payer: Self-pay

## 2020-08-15 VITALS — BP 112/60 | HR 70 | Resp 16 | Ht 62.0 in | Wt 199.0 lb

## 2020-08-15 DIAGNOSIS — J45909 Unspecified asthma, uncomplicated: Secondary | ICD-10-CM | POA: Insufficient documentation

## 2020-08-15 DIAGNOSIS — M8580 Other specified disorders of bone density and structure, unspecified site: Secondary | ICD-10-CM | POA: Insufficient documentation

## 2020-08-15 DIAGNOSIS — K2100 Gastro-esophageal reflux disease with esophagitis, without bleeding: Secondary | ICD-10-CM | POA: Insufficient documentation

## 2020-08-15 DIAGNOSIS — N182 Chronic kidney disease, stage 2 (mild): Secondary | ICD-10-CM | POA: Insufficient documentation

## 2020-08-15 DIAGNOSIS — I10 Essential (primary) hypertension: Secondary | ICD-10-CM | POA: Diagnosis not present

## 2020-08-15 DIAGNOSIS — F321 Major depressive disorder, single episode, moderate: Secondary | ICD-10-CM | POA: Insufficient documentation

## 2020-08-15 DIAGNOSIS — M109 Gout, unspecified: Secondary | ICD-10-CM | POA: Insufficient documentation

## 2020-08-15 DIAGNOSIS — Z0000 Encounter for general adult medical examination without abnormal findings: Secondary | ICD-10-CM | POA: Insufficient documentation

## 2020-08-15 NOTE — Patient Instructions (Addendum)
It was nice meeting you today!  We would like to keep your blood pressure less than 130/80  Continue your amlodipine 10mg  once daily, Maxzide 37.5/25 once a day, and olmesartan 20mg  once daily  Continue to watch how much salt you eat   Continue your chair exercises at the senior center  Please call with any questions!  06-20-1993, PharmD, BCACP, CDCES, CPP [facility] [address] [place], [address] [place] Phone: [phone]; Fax: [phone] 08/15/2020 10:31 AM

## 2020-08-15 NOTE — Progress Notes (Signed)
Patient ID: Sonia Beck                 DOB: 1945-06-04                      MRN: [id number]     HPI: Sonia Beck is a 75 y.o. female referred by Dr. Herbie Beck to HTN clinic. PMH is significant foraddition to hypertension her medical history is significant for hyperlipidemia, vitamin D deficiency and obesity.   She saw Dr. Herbie Beck last in August of 21 at which time her pressure was elevated at 152/84.  She is also followed by [facility], and chart indicates she has history of white coat hypertension.   At last visit with pharmacist, patient was switched from losartan to olmesartan and was encouraged to continue watch sodium intake and increase exercise.  [facility] that [facility] how to complete chair exercises.  Has been working on reducing salt in her diet.  Current HTN meds: amlodipine 10mg , olmesartan 20mg , maxzide 37.5/25 daily Previously tried: losartan 100mg  BP goal: <130/80   Wt Readings from Last 3 Encounters:  08/14/20 195 lb (88.5 kg)  07/31/20 201 lb (91.2 kg)  07/11/20 198 lb (89.8 kg)   BP Readings from Last 3 Encounters:  08/14/20 140/84  07/31/20 140/83  07/19/20 (!) 155/85   Pulse Readings from Last 3 Encounters:  08/14/20 86  07/31/20 81  07/19/20 79    Renal function: CrCl cannot be calculated (Patient's most recent lab result is older than the maximum 21 days allowed.).  Past Medical History:  Diagnosis Date  . Anxiety   . Anxiety   . Asthma   . Back pain   . Bone spur    bilateral shoulders  . Cardiac murmur Echo October 2013   Aortic sclerosis, not stenosis  . Chronic renal failure syndrome, stage 2 (mild)   . Depression   . Depression   . Eczema   . Gout   . Heartburn   . Hyperlipidemia   . Hypertension   . Joint pain   . Obesity (BMI 30-39.9)    Former weight 224 pounds with BMI of 41 -- 65 pound weight loss in one year  . Osteoarthritis   . Prediabetes   . [place] spotted fever 2021  . SOBOE (shortness of breath on exertion)   . Thrombocytopenia (HCC)     Current Outpatient Medications on File Prior to Visit  Medication Sig Dispense Refill  . acetaminophen (TYLENOL) 500 MG tablet Take 500 mg by mouth as needed. TAKE 2 TABLET AS NEEDED    . ALPRAZolam (XANAX) 0.25 MG tablet Take 0.25 mg by mouth 2 (two) times daily as needed for anxiety.     07/21/20 amLODipine (NORVASC) 10 MG tablet Take 10 mg by mouth daily.     . calcium-vitamin D (OSCAL WITH D) 500-200 MG-UNIT per tablet Take 1 tablet by mouth daily.    November 2013 FLUoxetine (PROZAC) 20 MG capsule Take 20 mg by mouth daily.    2022 ibuprofen (ADVIL) 200 MG tablet Take 200 mg by mouth as needed. TAKE  400 MG  TO 600 MG AS NEEDED    . montelukast (SINGULAIR) 10 MG tablet Take 10 mg by mouth at bedtime.    [name] Kitchen olmesartan (BENICAR) 20 MG tablet Take 1 tablet (20 mg total) by mouth daily. 30 tablet 5  . ondansetron (ZOFRAN) 8 MG tablet Take 8  mg by mouth every 8 (eight) hours as needed for nausea or vomiting.     . pantoprazole (PROTONIX) 40 MG tablet     . potassium chloride SA (K-DUR,KLOR-CON) 20 MEQ tablet Take 20 mEq by mouth daily.    . rosuvastatin (CRESTOR) 5 MG tablet Take 5 mg by mouth every Sunday.     . triamterene-hydrochlorothiazide (MAXZIDE-25) 37.5-25 MG per tablet Take 1 tablet by mouth daily.    . Vitamin D, Ergocalciferol, (DRISDOL) 1.25 MG (50000 UNIT) CAPS capsule Take 1 capsule (50,000 Units total) by mouth every 7 (seven) days. (OV for RF) 4 capsule 0   No current facility-administered medications on file prior to visit.    Allergies  Allergen Reactions  . Erythromycin Other (See Comments)    Stomach cramps.  . Fosamax [Alendronate] Other (See Comments)    Joint pain.     Assessment/Plan:  1. Hypertension - Patient blood pressure today 112/60 which is at goal of <130/80.  Patient has made excellent lifestyle changes in reducing sodium intake and working with exercising at [employer] [facility] and at [employer].  No medication changes needed at this time.  Continue olmesartan 20 mg daily Continue Maxzide 37.5/25 once daily Continue amlodipine 10mg  daily Recheck in 3 months with Dr , PharmD, BCACP, CDCES, CPP [facility] [address] [place], [place] Phone: [phone]; Fax: [phone] 08/16/2020 4:41 PM

## 2020-08-22 NOTE — Progress Notes (Signed)
Chief Complaint:   OBESITY Sonia Beck is here to discuss her progress with her obesity treatment plan along with follow-up of her obesity related diagnoses.   Today's visit was #: 7 Starting weight: 208 lbs Starting date: 05/02/2020 Today's weight: 195 lbs Today's date: 08/14/2020 Weight change since last visit: 6 lbs Total lbs lost to date: 13 lbs Body mass index is 38.08 kg/m.  Total weight loss percentage to date: -6.25%  Interim History:  Sonia Beck lost 6 pounds since her last office visit.  She followed the meal plan for 1 week only and lost the weight.  For snacks, she had Protein One bar, Greek yogurt.  She says this was the first time she has followed the plan 100% and states she has no hunger or cravings.  She is drinking more water as well (~40-50 ounces per day).  Current Meal Plan: the Category 1 Plan for 50% of the time.  Current Exercise Plan: Chair exercises/walking for 60 minutes 5 times per week.    Assessment/Plan:    Medications Discontinued During This Encounter  Medication Reason  . Vitamin D, Ergocalciferol, (DRISDOL) 1.25 MG (50000 UNIT) CAPS capsule Reorder     Meds ordered this encounter  Medications  . Vitamin D, Ergocalciferol, (DRISDOL) 1.25 MG (50000 UNIT) CAPS capsule    Sig: Take 1 capsule (50,000 Units total) by mouth every 7 (seven) days. (OV for RF)    Dispense:  4 capsule    Refill:  0    1. Essential hypertension Not at goal. Medications: Norvasc 10 mg daily, Benicar 20 mg daily, Maxzide-25 37.5-25 mg daily.   Plan:  Improved blood pressure, at goal today.  Continue medications per PCP/specialists and continue prudent nutritional plan and weight loss.  Avoid buying foods that are: processed, frozen, or prepackaged to avoid excess salt. We will watch for signs of hypotension as she continues lifestyle modifications.  BP Readings from Last 3 Encounters:  08/15/20 112/60  08/14/20 140/84  07/31/20 140/83   Lab Results  Component  Value Date   CREATININE 1.0 04/26/2020   2. Vitamin D deficiency Not at goal. Current vitamin D is 20.9, tested on 05/02/2020. Optimal goal > 50 ng/dL.  She is taking vitamin D 50,000 IU weekly.   Plan: Continue to take prescription Vitamin D @50 ,000 IU every week as prescribed.  Follow-up for routine testing of Vitamin D, at least 2-3 times per year to avoid over-replacement.  - Refill Vitamin D, Ergocalciferol, (DRISDOL) 1.25 MG (50000 UNIT) CAPS capsule; Take 1 capsule (50,000 Units total) by mouth every 7 (seven) days. (OV for RF)  Dispense: 4 capsule; Refill: 0  3. Obesity, current BMI 38.2  Course: Sonia Beck is currently in the action stage of change. As such, her goal is to continue with weight loss efforts.   Nutrition goals: She has agreed to the Category 1 Plan.   Exercise goals: As is.  Behavioral modification strategies: keeping healthy foods in the home and planning for success.  Stick to the meal plan but allow for 1 "cheat meal" per week.  Sonia Beck has agreed to follow-up with our clinic in 4 weeks.  She wants to do her follow-up in 4 weeks due to cost.  She was informed of the importance of frequent follow-up visits to maximize her success with intensive lifestyle modifications for her multiple health conditions.     Objective:   Blood pressure 140/84, pulse 86, temperature 98 F (36.7 C), height 5' (1.524 m), weight  195 lb (88.5 kg), SpO2 96 %. Body mass index is 38.08 kg/m.  General: Cooperative, alert, well developed, in no acute distress. HEENT: Conjunctivae and lids unremarkable. Cardiovascular: Regular rhythm.  Lungs: Normal work of breathing. Neurologic: No focal deficits.   Lab Results  Component Value Date   CREATININE 1.0 04/26/2020   BUN 12 04/26/2020   NA 142 04/26/2020   K 4.4 04/26/2020   CL 101 04/26/2020   CO2 30 (A) 04/26/2020   Lab Results  Component Value Date   ALT 24 04/26/2020   AST 20 04/26/2020   ALKPHOS 113 04/26/2020   BILITOT  0.5 01/14/2016   Lab Results  Component Value Date   HGBA1C 6.2 04/26/2020   HGBA1C 6.1 03/13/2007   Lab Results  Component Value Date   INSULIN 9.9 05/02/2020   Lab Results  Component Value Date   TSH 0.821 05/02/2020   Lab Results  Component Value Date   CHOL 194 04/26/2020   HDL 73 (A) 04/26/2020   LDLCALC 109 04/26/2020   TRIG 59 04/26/2020   Lab Results  Component Value Date   WBC 8.0 04/26/2020   HGB 12.5 04/26/2020   HCT 37 04/26/2020   MCV 77.1 (L) 01/14/2016   PLT 473 (A) 04/26/2020   Obesity Behavioral Intervention:   Approximately 15 minutes were spent on the discussion below.  ASK: We discussed the diagnosis of obesity with Sonia Beck today and Sonia Beck agreed to give [place] permission to discuss obesity behavioral modification therapy today.  ASSESS: Sonia Beck has the diagnosis of obesity and her BMI today is 38.2. Glory is in the action stage of change.   ADVISE: Sonia Beck was educated on the multiple health risks of obesity as well as the benefit of weight loss to improve her health. She was advised of the need for long term treatment and the importance of lifestyle modifications to improve her current health and to decrease her risk of future health problems.  AGREE: Multiple dietary modification options and treatment options were discussed and Sonia Beck agreed to follow the recommendations documented in the above note.  ARRANGE: Sonia Beck was educated on the importance of frequent visits to treat obesity as outlined per CMS and USPSTF guidelines and agreed to schedule her next follow up appointment today.  Attestation Statements:   Reviewed by clinician on day of visit: allergies, medications, problem list, medical history, surgical history, family history, social history, and previous encounter notes.  I, Insurance claims handler, CMA, am acting as Energy manager for [employer], DO.  I have reviewed the above documentation for accuracy and completeness, and  I agree with the above. Carlye Grippe, D.O.  The 21st Century Cures Act was signed into law in 2016 which includes the topic of electronic health records.  This provides immediate access to information in MyChart.  This includes consultation notes, operative notes, office notes, lab results and pathology reports.  If you have any questions about what you read please let us know at your next visit so we can discuss your concerns and take corrective action if need be.  We are right here with you.

## 2020-09-07 ENCOUNTER — Encounter (INDEPENDENT_AMBULATORY_CARE_PROVIDER_SITE_OTHER): Payer: Self-pay

## 2020-09-11 ENCOUNTER — Ambulatory Visit (INDEPENDENT_AMBULATORY_CARE_PROVIDER_SITE_OTHER): Payer: Medicare PPO | Admitting: Family Medicine

## 2020-09-11 ENCOUNTER — Other Ambulatory Visit: Payer: Self-pay

## 2020-09-11 ENCOUNTER — Encounter (INDEPENDENT_AMBULATORY_CARE_PROVIDER_SITE_OTHER): Payer: Self-pay | Admitting: Bariatrics

## 2020-09-11 ENCOUNTER — Ambulatory Visit (INDEPENDENT_AMBULATORY_CARE_PROVIDER_SITE_OTHER): Payer: Medicare PPO | Admitting: Bariatrics

## 2020-09-11 VITALS — BP 119/73 | HR 64 | Temp 98.3°F | Ht 60.0 in | Wt 190.0 lb

## 2020-09-11 DIAGNOSIS — Z6841 Body Mass Index (BMI) 40.0 and over, adult: Secondary | ICD-10-CM

## 2020-09-11 DIAGNOSIS — E559 Vitamin D deficiency, unspecified: Secondary | ICD-10-CM

## 2020-09-11 DIAGNOSIS — R7303 Prediabetes: Secondary | ICD-10-CM

## 2020-09-11 MED ORDER — VITAMIN D (ERGOCALCIFEROL) 1.25 MG (50000 UNIT) PO CAPS: 50000.0000 [IU] | ORAL_CAPSULE | ORAL | 0 refills | Status: DC

## 2020-09-13 ENCOUNTER — Encounter (INDEPENDENT_AMBULATORY_CARE_PROVIDER_SITE_OTHER): Payer: Self-pay | Admitting: Bariatrics

## 2020-09-13 NOTE — Progress Notes (Signed)
Chief Complaint:   OBESITY Sonia Beck is here to discuss her progress with her obesity treatment plan along with follow-up of her obesity related diagnoses. Sonia Beck is on the Category 1 Plan and states she is following her eating plan approximately 99% of the time. Sonia Beck states she is doing chair exercises 45 minutes 2 times per week.  Today's visit was #: 8 Starting weight: 208 lbs Starting date: 05/02/2020 Today's weight: 190 lbs Today's date: 09/11/2020 Total lbs lost to date: 18 lbs Total lbs lost since last in-office visit: 5  Interim History: Sonia Beck is down an additional 5 lbs since her last visit and doing well overall.  Subjective:   1. Pre-diabetes Sonia Beck is not on medication.  2. Vitamin D deficiency Sonia Beck denies nausea, vomiting, and muscle weakness.  Assessment/Plan:   1. Pre-diabetes Sonia Beck will continue to work on weight loss, exercise, and decreasing simple carbohydrates to help decrease the risk of diabetes. Increase healthy fats and protein.  2. Vitamin D deficiency Low Vitamin D level contributes to fatigue and are associated with obesity, breast, and colon cancer. She agrees to continue to take prescription Vitamin D @50 ,000 IU every week and will follow-up for routine testing of Vitamin D, at least 2-3 times per year to avoid over-replacement.  - Vitamin D, Ergocalciferol, (DRISDOL) 1.25 MG (50000 UNIT) CAPS capsule; Take 1 capsule (50,000 Units total) by mouth every 7 (seven) days. (OV for RF)  Dispense: 4 capsule; Refill: 0  3. Obesity with current BMI of 37.1  Sonia Beck is currently in the action stage of change. As such, her goal is to continue with weight loss efforts. She has agreed to the Category 1 Plan.   Meal plan Intentional eating  Exercise goals: Continue chair exercises, flexibility, stretching, and going to PT.  Behavioral modification strategies: increasing lean protein intake, decreasing simple carbohydrates, increasing  vegetables, increasing water intake, decreasing eating out, no skipping meals, meal planning and cooking strategies, keeping healthy foods in the home and planning for success.  Sonia Beck has agreed to follow-up with our clinic in 2-3 weeks with Dr. Claris Beck. She was informed of the importance of frequent follow-up visits to maximize her success with intensive lifestyle modifications for her multiple health conditions.   Objective:   Blood pressure 119/73, pulse 64, temperature 98.3 F (36.8 C), height 5' (1.524 m), weight 190 lb (86.2 kg), SpO2 98 %. Body mass index is 37.11 kg/m.  General: Cooperative, alert, well developed, in no acute distress. HEENT: Conjunctivae and lids unremarkable. Cardiovascular: Regular rhythm.  Lungs: Normal work of breathing. Neurologic: No focal deficits.   Lab Results  Component Value Date   CREATININE 1.0 04/26/2020   BUN 12 04/26/2020   NA 142 04/26/2020   K 4.4 04/26/2020   CL 101 04/26/2020   CO2 30 (A) 04/26/2020   Lab Results  Component Value Date   ALT 24 04/26/2020   AST 20 04/26/2020   ALKPHOS 113 04/26/2020   BILITOT 0.5 01/14/2016   Lab Results  Component Value Date   HGBA1C 6.2 04/26/2020   HGBA1C 6.1 03/13/2007   Lab Results  Component Value Date   INSULIN 9.9 05/02/2020   Lab Results  Component Value Date   TSH 0.821 05/02/2020   Lab Results  Component Value Date   CHOL 194 04/26/2020   HDL 73 (A) 04/26/2020   LDLCALC 109 04/26/2020   TRIG 59 04/26/2020   Lab Results  Component Value Date   WBC 8.0 04/26/2020  HGB 12.5 04/26/2020   HCT 37 04/26/2020   MCV 77.1 (L) 01/14/2016   PLT 473 (A) 04/26/2020   No results found for: IRON, TIBC, FERRITIN  Obesity Behavioral Intervention:   Approximately 15 minutes were spent on the discussion below.  ASK: We discussed the diagnosis of obesity with Sonia Beck today and Rilei agreed to give [place] permission to discuss obesity behavioral modification therapy  today.  ASSESS: Inez has the diagnosis of obesity and her BMI today is 37.1. Redith is in the action stage of change.   ADVISE: Bianka was educated on the multiple health risks of obesity as well as the benefit of weight loss to improve her health. She was advised of the need for long term treatment and the importance of lifestyle modifications to improve her current health and to decrease her risk of future health problems.  AGREE: Multiple dietary modification options and treatment options were discussed and Kaytelyn agreed to follow the recommendations documented in the above note.  ARRANGE: Kathalina was educated on the importance of frequent visits to treat obesity as outlined per CMS and USPSTF guidelines and agreed to schedule her next follow up appointment today.  Attestation Statements:   Reviewed by clinician on day of visit: allergies, medications, problem list, medical history, surgical history, family history, social history, and previous encounter notes.  Sonia Beck, CMA, am acting as Energy manager for [employer], DO.  I have reviewed the above documentation for accuracy and completeness, and I agree with the above. Corinna Capra, DO

## 2020-10-07 DIAGNOSIS — Z20822 Contact with and (suspected) exposure to covid-19: Secondary | ICD-10-CM | POA: Diagnosis not present

## 2020-10-10 ENCOUNTER — Ambulatory Visit (INDEPENDENT_AMBULATORY_CARE_PROVIDER_SITE_OTHER): Payer: Medicare PPO | Admitting: Family Medicine

## 2020-10-16 ENCOUNTER — Other Ambulatory Visit: Payer: Self-pay

## 2020-10-16 ENCOUNTER — Ambulatory Visit (INDEPENDENT_AMBULATORY_CARE_PROVIDER_SITE_OTHER): Payer: Medicare PPO | Admitting: Family Medicine

## 2020-10-16 VITALS — BP 121/78 | HR 68 | Temp 98.6°F | Ht 60.0 in | Wt 193.0 lb

## 2020-10-16 DIAGNOSIS — E559 Vitamin D deficiency, unspecified: Secondary | ICD-10-CM

## 2020-10-16 DIAGNOSIS — Z6841 Body Mass Index (BMI) 40.0 and over, adult: Secondary | ICD-10-CM

## 2020-10-16 DIAGNOSIS — R7303 Prediabetes: Secondary | ICD-10-CM

## 2020-10-16 MED ORDER — VITAMIN D (ERGOCALCIFEROL) 1.25 MG (50000 UNIT) PO CAPS: 50000.0000 [IU] | ORAL_CAPSULE | ORAL | 0 refills | Status: DC

## 2020-10-17 LAB — VITAMIN D 25 HYDROXY (VIT D DEFICIENCY, FRACTURES): Vit D, 25-Hydroxy: 62.9 ng/mL (ref 30.0–100.0)

## 2020-10-26 NOTE — Progress Notes (Signed)
Chief Complaint:   OBESITY Sonia Beck is here to discuss her progress with her obesity treatment plan along with follow-up of her obesity related diagnoses.   Today's visit was #: 9 Starting weight: 208 lbs Starting date: 05/02/2020 Today's weight: 193 lbs Today's date: 10/16/2020 Weight change since last visit: +3 lbs Total lbs lost to date: 15 lbs Body mass index is 37.69 kg/m.  Total weight loss percentage to date: -7.21%  Interim History:  [name] recently had a sinus infection.  She is on antibiotics and had decreased intake or increased eating at other times.  Last seen by Dr. Manson Passey on 09/11/2020.  No change in meal plan.  Plan:  Per patient, PCP will obtain FBW on August 8 and CPE on August 24.  Current Meal Plan: the Category 1 Plan for 5% of the time.  Current Exercise Plan: None.  Assessment/Plan:   Orders Placed This Encounter  Procedures   VITAMIN D 25 Hydroxy (Vit-D Deficiency, Fractures)   Medications Discontinued During This Encounter  Medication Reason   Vitamin D, Ergocalciferol, (DRISDOL) 1.25 MG (50000 UNIT) CAPS capsule Reorder   Meds ordered this encounter  Medications   Vitamin D, Ergocalciferol, (DRISDOL) 1.25 MG (50000 UNIT) CAPS capsule    Sig: Take 1 capsule (50,000 Units total) by mouth every 7 (seven) days. (OV for RF)    Dispense:  4 capsule    Refill:  0   1. Prediabetes Improving, but not optimized. Goal is HgbA1c < 5.7.  Medication: None.   A1c was 6.2 ~5 months ago.  Plan:  She will continue to focus on protein-rich, low simple carbohydrate foods. We reviewed the importance of hydration, regular exercise for stress reduction, and restorative sleep.  She declines labs today.  Will obtain with PCP in the near future and bring in a copy.   Lab Results  Component Value Date   HGBA1C 6.2 04/26/2020   Lab Results  Component Value Date   INSULIN 9.9 05/02/2020   2. Vitamin D deficiency At goal.  She is taking vitamin D 50,000 IU  weekly.  Medication compliance is excellent.  Plan: Continue to take prescription Vitamin D @50 ,000 IU every week as prescribed.  Recheck vitamin D level today.    Lab Results  Component Value Date   VD25OH 62.9 10/16/2020   VD25OH 20.9 (L) 05/02/2020   - Refill Vitamin D, Ergocalciferol, (DRISDOL) 1.25 MG (50000 UNIT) CAPS capsule; Take 1 capsule (50,000 Units total) by mouth every 7 (seven) days. (OV for RF)  Dispense: 4 capsule; Refill: 0 - VITAMIN D 25 Hydroxy (Vit-D Deficiency, Fractures)  3. Obesity BMI today is 37  Course: [name] is currently in the action stage of change. As such, her goal is to continue with weight loss efforts.   Nutrition goals: She has agreed to the Category 1 Plan.   Exercise goals:  Start walking for 5 minutes per day.  Behavioral modification strategies: increasing lean protein intake, decreasing simple carbohydrates, and planning for success.  Emory has agreed to follow-up with our clinic in 3 weeks. She was informed of the importance of frequent follow-up visits to maximize her success with intensive lifestyle modifications for her multiple health conditions.   Objective:   Blood pressure 121/78, pulse 68, temperature 98.6 F (37 C), height 5' (1.524 m), weight 193 lb (87.5 kg), SpO2 98 %. Body mass index is 37.69 kg/m.  General: Cooperative, alert, well developed, in no acute distress. HEENT: Conjunctivae and lids unremarkable. Cardiovascular:  Regular rhythm.  Lungs: Normal work of breathing. Neurologic: No focal deficits.   Lab Results  Component Value Date   CREATININE 1.0 04/26/2020   BUN 12 04/26/2020   NA 142 04/26/2020   K 4.4 04/26/2020   CL 101 04/26/2020   CO2 30 (A) 04/26/2020   Lab Results  Component Value Date   ALT 24 04/26/2020   AST 20 04/26/2020   ALKPHOS 113 04/26/2020   BILITOT 0.5 01/14/2016   Lab Results  Component Value Date   HGBA1C 6.2 04/26/2020   HGBA1C 6.1 03/13/2007   Lab Results  Component  Value Date   INSULIN 9.9 05/02/2020   Lab Results  Component Value Date   TSH 0.821 05/02/2020   Lab Results  Component Value Date   CHOL 194 04/26/2020   HDL 73 (A) 04/26/2020   LDLCALC 109 04/26/2020   TRIG 59 04/26/2020   Lab Results  Component Value Date   VD25OH 62.9 10/16/2020   VD25OH 20.9 (L) 05/02/2020   Lab Results  Component Value Date   WBC 8.0 04/26/2020   HGB 12.5 04/26/2020   HCT 37 04/26/2020   MCV 77.1 (L) 01/14/2016   PLT 473 (A) 04/26/2020   Obesity Behavioral Intervention:   Approximately 15 minutes were spent on the discussion below.  ASK: We discussed the diagnosis of obesity with Claris Che today and Kenyon agreed to give [place] permission to discuss obesity behavioral modification therapy today.  ASSESS: Reniyah has the diagnosis of obesity and her BMI today is 37.7. Charday is in the action stage of change.   ADVISE: Nalani was educated on the multiple health risks of obesity as well as the benefit of weight loss to improve her health. She was advised of the need for long term treatment and the importance of lifestyle modifications to improve her current health and to decrease her risk of future health problems.  AGREE: Multiple dietary modification options and treatment options were discussed and Imari agreed to follow the recommendations documented in the above note.  ARRANGE: Davanna was educated on the importance of frequent visits to treat obesity as outlined per CMS and USPSTF guidelines and agreed to schedule her next follow up appointment today.  Attestation Statements:   Reviewed by clinician on day of visit: allergies, medications, problem list, medical history, surgical history, family history, social history, and previous encounter notes.  I, Insurance claims handler, CMA, am acting as Energy manager for [employer], DO.  I have reviewed the above documentation for accuracy and completeness, and I agree with the above. Carlye Grippe, D.O.  The 21st Century Cures Act was signed into law in 2016 which includes the topic of electronic health records.  This provides immediate access to information in MyChart.  This includes consultation notes, operative notes, office notes, lab results and pathology reports.  If you have any questions about what you read please let us know at your next visit so we can discuss your concerns and take corrective action if need be.  We are right here with you.

## 2020-11-06 ENCOUNTER — Ambulatory Visit (INDEPENDENT_AMBULATORY_CARE_PROVIDER_SITE_OTHER): Payer: Medicare PPO | Admitting: Family Medicine

## 2020-11-06 ENCOUNTER — Encounter (INDEPENDENT_AMBULATORY_CARE_PROVIDER_SITE_OTHER): Payer: Self-pay | Admitting: Family Medicine

## 2020-11-06 ENCOUNTER — Other Ambulatory Visit: Payer: Self-pay

## 2020-11-06 VITALS — BP 107/69 | HR 71 | Temp 98.0°F | Ht 60.0 in | Wt 188.0 lb

## 2020-11-06 DIAGNOSIS — E559 Vitamin D deficiency, unspecified: Secondary | ICD-10-CM | POA: Diagnosis not present

## 2020-11-06 DIAGNOSIS — I10 Essential (primary) hypertension: Secondary | ICD-10-CM | POA: Diagnosis not present

## 2020-11-06 DIAGNOSIS — Z6841 Body Mass Index (BMI) 40.0 and over, adult: Secondary | ICD-10-CM

## 2020-11-06 MED ORDER — VITAMIN D (ERGOCALCIFEROL) 1.25 MG (50000 UNIT) PO CAPS: 50000.0000 [IU] | ORAL_CAPSULE | ORAL | 0 refills | Status: DC

## 2020-11-07 DIAGNOSIS — H40013 Open angle with borderline findings, low risk, bilateral: Secondary | ICD-10-CM | POA: Diagnosis not present

## 2020-11-07 DIAGNOSIS — H04123 Dry eye syndrome of bilateral lacrimal glands: Secondary | ICD-10-CM | POA: Diagnosis not present

## 2020-11-07 NOTE — Progress Notes (Signed)
Chief Complaint:   OBESITY Elona is here to discuss her progress with her obesity treatment plan along with follow-up of her obesity related diagnoses. Tyannah is on the Category 1 Plan and states she is following her eating plan approximately 90% of the time. Kamoni states she is doing light house work 15 minutes 3-4 times per week.  Today's visit was #: 10 Starting weight: 208 lbs Starting date: 05/02/2020 Today's weight: 188 lbs Today's date: 11/06/2020 Total lbs lost to date: 20 Total lbs lost since last in-office visit: 5  Interim History: Anhelica Fowers is here for a follow up office visit. We reviewed her meal plan and questions were answered. Patient's food recall appears to be accurate and consistent with what is on plan when she is following it. When eating on plan, her hunger and cravings are well controlled.    Subjective:   1. Vitamin D deficiency Avree's lab level is ideal at 62.9. She is currently taking prescription vitamin D 50,000 IU each week. She denies nausea, vomiting or muscle weakness.  Lab Results  Component Value Date   VD25OH 62.9 10/16/2020   VD25OH 20.9 (L) 05/02/2020   2. Essential hypertension Cardiovascular ROS: no chest pain or dyspnea on exertion.  BP Readings from Last 3 Encounters:  11/06/20 107/69  10/16/20 121/78  09/11/20 119/73   Lab Results  Component Value Date   CREATININE 1.0 04/26/2020   CREATININE 0.9 10/18/2019   CREATININE 0.9 09/23/2019   Assessment/Plan:  No orders of the defined types were placed in this encounter.   Medications Discontinued During This Encounter  Medication Reason   Vitamin D, Ergocalciferol, (DRISDOL) 1.25 MG (50000 UNIT) CAPS capsule Reorder     Meds ordered this encounter  Medications   Vitamin D, Ergocalciferol, (DRISDOL) 1.25 MG (50000 UNIT) CAPS capsule    Sig: Take 1 capsule (50,000 Units total) by mouth every 7 (seven) days. (OV for RF)    Dispense:  4 capsule    Refill:   0     1. Vitamin D deficiency Low Vitamin D level contributes to fatigue and are associated with obesity, breast, and colon cancer. She agrees to continue to take prescription Vitamin D @50 ,000 IU every week and will follow-up for routine testing of Vitamin D, at least 2-3 times per year to avoid over-replacement.  Refill- Vitamin D, Ergocalciferol, (DRISDOL) 1.25 MG (50000 UNIT) CAPS capsule; Take 1 capsule (50,000 Units total) by mouth every 7 (seven) days. (OV for RF)  Dispense: 4 capsule; Refill: 0  2. Essential hypertension Auriella's BP is low normal today. She will check BP at home. Pt has an appointment with cardiology August 10th and bring log to them. She is working on healthy weight loss and exercise to improve blood pressure control. We will watch for signs of hypotension as she continues her lifestyle modifications.  3. Obesity with current BMI of 36.7  Mikaya is currently in the action stage of change. As such, her goal is to continue with weight loss efforts. She has agreed to the Category 1 Plan.   Brandolyn is having labs August 8th with PCP (fasting). She will bring 09-[birth year]1974 a hard copy of results to next OV.  Exercise goals: Older adults should follow the adult guidelines. When older adults cannot meet the adult guidelines, they should be as physically active as their abilities and conditions will allow.   Behavioral modification strategies: increasing lean protein intake and decreasing simple carbohydrates.  Louanne has agreed  to follow-up with our clinic in 2-3 weeks. She was informed of the importance of frequent follow-up visits to maximize her success with intensive lifestyle modifications for her multiple health conditions.   Objective:   Blood pressure 107/69, pulse 71, temperature 98 F (36.7 C), height 5' (1.524 m), weight 188 lb (85.3 kg), SpO2 97 %. Body mass index is 36.72 kg/m.  General: Cooperative, alert, well developed, in no acute distress. HEENT:  Conjunctivae and lids unremarkable. Cardiovascular: Regular rhythm.  Lungs: Normal work of breathing. Neurologic: No focal deficits.   Lab Results  Component Value Date   CREATININE 1.0 04/26/2020   BUN 12 04/26/2020   NA 142 04/26/2020   K 4.4 04/26/2020   CL 101 04/26/2020   CO2 30 (A) 04/26/2020   Lab Results  Component Value Date   ALT 24 04/26/2020   AST 20 04/26/2020   ALKPHOS 113 04/26/2020   BILITOT 0.5 01/14/2016   Lab Results  Component Value Date   HGBA1C 6.2 04/26/2020   HGBA1C 6.1 03/13/2007   Lab Results  Component Value Date   INSULIN 9.9 05/02/2020   Lab Results  Component Value Date   TSH 0.821 05/02/2020   Lab Results  Component Value Date   CHOL 194 04/26/2020   HDL 73 (A) 04/26/2020   LDLCALC 109 04/26/2020   TRIG 59 04/26/2020   Lab Results  Component Value Date   VD25OH 62.9 10/16/2020   VD25OH 20.9 (L) 05/02/2020   Lab Results  Component Value Date   WBC 8.0 04/26/2020   HGB 12.5 04/26/2020   HCT 37 04/26/2020   MCV 77.1 (L) 01/14/2016   PLT 473 (A) 04/26/2020   No results found for: IRON, TIBC, FERRITIN  Obesity Behavioral Intervention:   Approximately 15 minutes were spent on the discussion below.  ASK: We discussed the diagnosis of obesity with Claris Che today and Marnesha agreed to give [place] permission to discuss obesity behavioral modification therapy today.  ASSESS: Tanayah has the diagnosis of obesity and her BMI today is 36.7. Darnice is in the action stage of change.   ADVISE: Sherilynn was educated on the multiple health risks of obesity as well as the benefit of weight loss to improve her health. She was advised of the need for long term treatment and the importance of lifestyle modifications to improve her current health and to decrease her risk of future health problems.  AGREE: Multiple dietary modification options and treatment options were discussed and Kharlie agreed to follow the recommendations documented  in the above note.  ARRANGE: Salem was educated on the importance of frequent visits to treat obesity as outlined per CMS and USPSTF guidelines and agreed to schedule her next follow up appointment today.  Attestation Statements:   Reviewed by clinician on day of visit: allergies, medications, problem list, medical history, surgical history, family history, social history, and previous encounter notes.  Edmund Hilda, CMA, am acting as transcriptionist for [employer], DO.  I have reviewed the above documentation for accuracy and completeness, and I agree with the above. Carlye Grippe, D.O.  The 21st Century Cures Act was signed into law in 2016 which includes the topic of electronic health records.  This provides immediate access to information in MyChart.  This includes consultation notes, operative notes, office notes, lab results and pathology reports.  If you have any questions about what you read please let us know at your next visit so we can discuss your concerns and  take corrective action if need be.  We are right here with you.

## 2020-11-13 DIAGNOSIS — I10 Essential (primary) hypertension: Secondary | ICD-10-CM | POA: Diagnosis not present

## 2020-11-13 DIAGNOSIS — Z20822 Contact with and (suspected) exposure to covid-19: Secondary | ICD-10-CM | POA: Diagnosis not present

## 2020-11-13 DIAGNOSIS — N182 Chronic kidney disease, stage 2 (mild): Secondary | ICD-10-CM | POA: Diagnosis not present

## 2020-11-13 DIAGNOSIS — R7309 Other abnormal glucose: Secondary | ICD-10-CM | POA: Diagnosis not present

## 2020-11-13 LAB — COMPREHENSIVE METABOLIC PANEL
Albumin: 3.7 (ref 3.5–5.0)
Calcium: 9.7 (ref 8.7–10.7)
GFR calc Af Amer: 70
Globulin: 4.6

## 2020-11-13 LAB — BASIC METABOLIC PANEL
BUN: 12 (ref 4–21)
CO2: 28 — AB (ref 13–22)
Chloride: 103 (ref 99–108)
Creatinine: 0.9 (ref <=1.1)
Glucose: 89
Potassium: 4.2 (ref 3.4–5.3)
Sodium: 143 (ref 137–147)

## 2020-11-13 LAB — CBC AND DIFFERENTIAL
HCT: 37 (ref 36–46)
Hemoglobin: 12.5 (ref 12.0–16.0)
Platelets: 473 — AB (ref 150–399)
WBC: 8

## 2020-11-13 LAB — LIPID PANEL
Cholesterol: 171 (ref 0–200)
HDL: 52 (ref 35–70)
LDL Cholesterol: 100
LDl/HDL Ratio: 1.9

## 2020-11-13 LAB — CBC: RBC: 4.86 (ref 3.87–5.11)

## 2020-11-13 LAB — HEPATIC FUNCTION PANEL
ALT: 21 (ref 7–35)
AST: 21 (ref 13–35)
Alkaline Phosphatase: 106 (ref 25–125)
Bilirubin, Total: 0.4

## 2020-11-13 LAB — HEMOGLOBIN A1C: Hemoglobin A1C: 6.5

## 2020-11-15 ENCOUNTER — Ambulatory Visit: Payer: Medicare PPO | Admitting: Cardiology

## 2020-11-29 DIAGNOSIS — J45909 Unspecified asthma, uncomplicated: Secondary | ICD-10-CM | POA: Diagnosis not present

## 2020-11-29 DIAGNOSIS — F321 Major depressive disorder, single episode, moderate: Secondary | ICD-10-CM | POA: Diagnosis not present

## 2020-11-29 DIAGNOSIS — N182 Chronic kidney disease, stage 2 (mild): Secondary | ICD-10-CM | POA: Diagnosis not present

## 2020-11-29 DIAGNOSIS — E782 Mixed hyperlipidemia: Secondary | ICD-10-CM | POA: Diagnosis not present

## 2020-11-29 DIAGNOSIS — R7309 Other abnormal glucose: Secondary | ICD-10-CM | POA: Diagnosis not present

## 2020-11-29 DIAGNOSIS — I10 Essential (primary) hypertension: Secondary | ICD-10-CM | POA: Diagnosis not present

## 2020-12-04 ENCOUNTER — Encounter (INDEPENDENT_AMBULATORY_CARE_PROVIDER_SITE_OTHER): Payer: Self-pay

## 2020-12-04 ENCOUNTER — Ambulatory Visit (INDEPENDENT_AMBULATORY_CARE_PROVIDER_SITE_OTHER): Payer: Medicare PPO | Admitting: Family Medicine

## 2020-12-04 ENCOUNTER — Other Ambulatory Visit: Payer: Self-pay

## 2020-12-04 ENCOUNTER — Encounter (INDEPENDENT_AMBULATORY_CARE_PROVIDER_SITE_OTHER): Payer: Self-pay | Admitting: Family Medicine

## 2020-12-04 VITALS — BP 136/84 | HR 69 | Temp 98.6°F | Ht 60.0 in | Wt 184.0 lb

## 2020-12-04 DIAGNOSIS — E559 Vitamin D deficiency, unspecified: Secondary | ICD-10-CM | POA: Diagnosis not present

## 2020-12-04 DIAGNOSIS — I10 Essential (primary) hypertension: Secondary | ICD-10-CM | POA: Diagnosis not present

## 2020-12-04 DIAGNOSIS — Z6841 Body Mass Index (BMI) 40.0 and over, adult: Secondary | ICD-10-CM | POA: Diagnosis not present

## 2020-12-04 MED ORDER — VITAMIN D (ERGOCALCIFEROL) 1.25 MG (50000 UNIT) PO CAPS: 50000.0000 [IU] | ORAL_CAPSULE | ORAL | 0 refills | Status: DC

## 2020-12-05 NOTE — Progress Notes (Signed)
Chief Complaint:   OBESITY Sonia Beck is here to discuss her progress with her obesity treatment plan along with follow-up of her obesity related diagnoses. Sonia Beck is on the Category 1 Plan and states she is following her eating plan approximately 2% of the time. Sonia Beck states she is doing 0 minutes 0 times per week.  Today's visit was #: 11 Starting weight: 208 lbs Starting date: 05/02/2020 Today's weight: 184 lbs Today's date: 12/04/2020 Total lbs lost to date: 24 Total lbs lost since last in-office visit: 4  Interim History: Sonia Beck had a sinus infection and GI upset with that for 8-9 days in the past 3 weeks. She wasn't able to eat much except chicken soup and crackers. She brought in labs from her primary care physician's office done on 11/13/2020, which I reviewed with the patient. She states her primary care physician told her she is pre-diabetic with an A1c of 6.5. She is in the process of changing primary care providers.   Subjective:   1. Essential hypertension Sonia Beck is tolerating medication(s) well without side effects.  Medication compliance is good and patient appears to be taking it as prescribed.  Denies additional concerns regarding this condition.   2. Vitamin D deficiency Sonia Beck is tolerating medication(s) well without side effects.  Medication compliance is good and patient appears to be taking it as prescribed.  Denies additional concerns regarding this condition.   Assessment/Plan:  No orders of the defined types were placed in this encounter.   Medications Discontinued During This Encounter  Medication Reason   ibuprofen (ADVIL) 200 MG tablet Error   Vitamin D, Ergocalciferol, (DRISDOL) 1.25 MG (50000 UNIT) CAPS capsule Reorder     Meds ordered this encounter  Medications   Vitamin D, Ergocalciferol, (DRISDOL) 1.25 MG (50000 UNIT) CAPS capsule    Sig: Take 1 capsule (50,000 Units total) by mouth every 7 (seven) days. (OV for  RF)    Dispense:  4 capsule    Refill:  0    Ov for RF     1. Essential hypertension Sonia Beck's BP is at goal today, and she will continue her medications.  - Counseled Sonia Beck on pathophysiology of disease and discussed treatment plan, which always includes dietary and lifestyle modification as first line.  - Lifestyle changes such as following our low salt, heart healthy meal plan and engaging in a regular exercise program discussed  - Avoid buying foods that are: processed, frozen, or prepackaged to avoid excess salt. - Ambulatory blood pressure monitoring encouraged.  Reminded patient that if they ever feel poorly in any way, to check their blood pressure and pulse as well. - We will continue to monitor closely alongside PCP/ specialists.  Pt reminded to also f/up with those individuals as instructed by them.  - We will continue to monitor symptoms as they relate to the her weight loss journey.  2. Vitamin D deficiency Sonia Beck will continue her medications, and we will refill prescription Vit D for 1 month.  - Discussed importance of vitamin D to their health and well-being.  - possible symptoms of low Vitamin D can be low energy, depressed mood, muscle aches, joint aches, osteoporosis etc. - low Vitamin D levels may be linked to an increased risk of cardiovascular events and even increased risk of cancers- such as colon and breast.  - I recommend pt take a 50,000 IU weekly prescription vit D - see script below   - Informed patient  this may be a lifelong thing, and she was encouraged to continue to take the medicine until told otherwise.   - we will need to monitor levels regularly (every 3-4 mo on average) to keep levels within normal limits.  - weight loss will likely improve availability of vitamin D, thus encouraged Sonia Beck to continue with meal plan and their weight loss efforts to further improve this condition - pt's questions and concerns regarding this condition  addressed.  - Vitamin D, Ergocalciferol, (DRISDOL) 1.25 MG (50000 UNIT) CAPS capsule; Take 1 capsule (50,000 Units total) by mouth every 7 (seven) days. (OV for RF)  Dispense: 4 capsule; Refill: 0  3. Obesity with current BMI of 36.0 Sonia Beck is currently in the action stage of change. As such, her goal is to continue with weight loss efforts. She has agreed to the Category 1 Plan.   Exercise goals: All adults should avoid inactivity. Some physical activity is better than none, and adults who participate in any amount of physical activity gain some health benefits. Walk for 5-10 minutes q daily.  Behavioral modification strategies: increasing lean protein intake and decreasing simple carbohydrates.  Sonia Beck has agreed to follow-up with our clinic in 3 weeks. She was informed of the importance of frequent follow-up visits to maximize her success with intensive lifestyle modifications for her multiple health conditions.   Objective:   Blood pressure 136/84, pulse 69, temperature 98.6 F (37 C), height 5' (1.524 m), weight 184 lb (83.5 kg), SpO2 96 %. Body mass index is 35.94 kg/m.  General: Cooperative, alert, well developed, in no acute distress. HEENT: Conjunctivae and lids unremarkable. Cardiovascular: Regular rhythm.  Lungs: Normal work of breathing. Neurologic: No focal deficits.   Lab Results  Component Value Date   CREATININE 0.9 11/13/2020   BUN 12 11/13/2020   NA 143 11/13/2020   K 4.2 11/13/2020   CL 103 11/13/2020   CO2 28 (A) 11/13/2020   Lab Results  Component Value Date   ALT 21 11/13/2020   AST 21 11/13/2020   ALKPHOS 106 11/13/2020   BILITOT 0.5 01/14/2016   Lab Results  Component Value Date   HGBA1C 6.5 11/13/2020   HGBA1C 6.2 04/26/2020   HGBA1C 6.1 03/13/2007   Lab Results  Component Value Date   INSULIN 9.9 05/02/2020   Lab Results  Component Value Date   TSH 0.821 05/02/2020   Lab Results  Component Value Date   CHOL 171 11/13/2020   HDL  52 11/13/2020   LDLCALC 100 11/13/2020   TRIG 59 04/26/2020   Lab Results  Component Value Date   VD25OH 62.9 10/16/2020   VD25OH 20.9 (L) 05/02/2020   Lab Results  Component Value Date   WBC 8.0 11/13/2020   HGB 12.5 11/13/2020   HCT 37 11/13/2020   MCV 77.1 (L) 01/14/2016   PLT 473 (A) 11/13/2020   No results found for: IRON, TIBC, FERRITIN  Obesity Behavioral Intervention:   Approximately 15 minutes were spent on the discussion below.  ASK: We discussed the diagnosis of obesity with Sonia Beck today and Sonia Beck agreed to give [place] permission to discuss obesity behavioral modification therapy today.  ASSESS: Sonia Beck has the diagnosis of obesity and her BMI today is 36.0. Sonia Beck is in the action stage of change.   ADVISE: Sonia Beck was educated on the multiple health risks of obesity as well as the benefit of weight loss to improve her health. She was advised of the need for long term treatment and the importance  of lifestyle modifications to improve her current health and to decrease her risk of future health problems.  AGREE: Multiple dietary modification options and treatment options were discussed and Sonia Beck agreed to follow the recommendations documented in the above note.  ARRANGE: Sonia Beck was educated on the importance of frequent visits to treat obesity as outlined per CMS and USPSTF guidelines and agreed to schedule her next follow up appointment today.  Attestation Statements:   Reviewed by clinician on day of visit: allergies, medications, problem list, medical history, surgical history, family history, social history, and previous encounter notes.   Trude Mcburney, am acting as transcriptionist for [employer], DO.  I have reviewed the above documentation for accuracy and completeness, and I agree with the above. Carlye Grippe, D.O.  The 21st Century Cures Act was signed into law in 2016 which includes the topic of electronic health records.   This provides immediate access to information in MyChart.  This includes consultation notes, operative notes, office notes, lab results and pathology reports.  If you have any questions about what you read please let us know at your next visit so we can discuss your concerns and take corrective action if need be.  We are right here with you.

## 2020-12-25 ENCOUNTER — Ambulatory Visit (INDEPENDENT_AMBULATORY_CARE_PROVIDER_SITE_OTHER): Payer: Medicare PPO | Admitting: Family Medicine

## 2020-12-25 ENCOUNTER — Other Ambulatory Visit: Payer: Self-pay

## 2020-12-25 ENCOUNTER — Encounter (INDEPENDENT_AMBULATORY_CARE_PROVIDER_SITE_OTHER): Payer: Self-pay | Admitting: Family Medicine

## 2020-12-25 VITALS — BP 114/69 | HR 67 | Temp 98.1°F | Ht 60.0 in | Wt 185.0 lb

## 2020-12-25 DIAGNOSIS — Z6841 Body Mass Index (BMI) 40.0 and over, adult: Secondary | ICD-10-CM

## 2020-12-25 DIAGNOSIS — K2100 Gastro-esophageal reflux disease with esophagitis, without bleeding: Secondary | ICD-10-CM

## 2020-12-25 DIAGNOSIS — E559 Vitamin D deficiency, unspecified: Secondary | ICD-10-CM

## 2020-12-25 MED ORDER — FAMOTIDINE 20 MG PO TABS: 20.0000 mg | ORAL_TABLET | Freq: Two times a day (BID) | ORAL | 0 refills | Status: DC

## 2020-12-25 MED ORDER — VITAMIN D (ERGOCALCIFEROL) 1.25 MG (50000 UNIT) PO CAPS
50000.0000 [IU] | ORAL_CAPSULE | ORAL | 0 refills | Status: DC
Start: 2020-12-25 — End: 2021-01-24

## 2020-12-26 ENCOUNTER — Telehealth: Payer: Self-pay | Admitting: Cardiology

## 2020-12-26 NOTE — Telephone Encounter (Signed)
Returned call to patient of Dr. Herbie Beck - he feels a catch in her chest, in certain positions, fleeting. She said it feels like she has pulled a muscle. She does not feel like the pain is squeezing, pressure, heaviness. Symptoms resolve with tylenol. Explained that symptoms do not sound cardiac in nature, but I will send a message to MD anyway to review

## 2020-12-26 NOTE — Telephone Encounter (Signed)
I agree with [place].  The symptoms do not sound cardiac in nature.  The fact that is positional and associated with breathing would make it more consistent with musculoskeletal type symptoms. Bryan Lemma, MD

## 2020-12-26 NOTE — Telephone Encounter (Signed)
Pt c/o of Chest Pain: STAT if CP now or developed within 24 hours  1. Are you having CP right now? no  2. Are you experiencing any other symptoms (ex. SOB, nausea, vomiting, sweating)? No   3. How long have you been experiencing CP? Since Sunday  4. Is your CP continuous or coming and going? Coming and going  5. Have you taken Nitroglycerin? No  ?   Patient states that she believes it may be gas or a muscle pain but if she turns a certain way then its a sudden "catch" in her chest. She states that it has been almost non existent today and tylenol has been helping but her mother had an aortic aneurysm and she just wants to make Dr. Herbie [place] aware.

## 2020-12-26 NOTE — Progress Notes (Signed)
Chief Complaint:   OBESITY Sonia Beck is here to discuss her progress with her obesity treatment plan along with follow-up of her obesity related diagnoses. Sonia Beck is on the Category 1 Plan and states she is following her eating plan approximately 10% of the time. Sonia Beck states she is walking the dog and house cleaning 10 minutes 1-2 times per week.  Today's visit was #: 12 Starting weight: 208 lbs Starting date: 05/02/2020 Today's weight: 185 lbs Today's date: 12/25/2020 Total lbs lost to date: 23 Total lbs lost since last in-office visit: +1  Interim History: Sonia Beck and her 100 year old grandson have changed to [employer] skim milk and Special K Protein Cereal.  Subjective:   1. Gastroesophageal reflux disease with esophagitis, unspecified whether hemorrhage Pt reports some epigastric discomfort lately. She has a history of GERD and is feeling like it's the same symptoms she had in the past.  2. Vitamin D deficiency She is currently taking prescription vitamin D 50,000 IU each week. She denies nausea, vomiting or muscle weakness.  Lab Results  Component Value Date   VD25OH 62.9 10/16/2020   VD25OH 20.9 (L) 05/02/2020   Assessment/Plan:  No orders of the defined types were placed in this encounter.   Medications Discontinued During This Encounter  Medication Reason   Vitamin D, Ergocalciferol, (DRISDOL) 1.25 MG (50000 UNIT) CAPS capsule Reorder     Meds ordered this encounter  Medications   Vitamin D, Ergocalciferol, (DRISDOL) 1.25 MG (50000 UNIT) CAPS capsule    Sig: Take 1 capsule (50,000 Units total) by mouth every 7 (seven) days. (OV for RF)    Dispense:  4 capsule    Refill:  0    Ov for RF   famotidine (PEPCID) 20 MG tablet    Sig: Take 1 tablet (20 mg total) by mouth 2 (two) times daily.    Dispense:  60 tablet    Refill:  0    30 d supply; OV for RF     1. Gastroesophageal reflux disease with esophagitis, unspecified whether hemorrhage Pt is to  start famotidine 20 mg BID. She is to take it daily for 1 week. If symptoms don't improve or worsen, pt needs an urgent evaluation with PCP. Intensive lifestyle modifications are the first line treatment for this issue. We discussed several lifestyle modifications today and she will continue to work on diet, exercise and weight loss efforts. Orders and follow up as documented in patient record.   Counseling If a person has gastroesophageal reflux disease (GERD), food and stomach acid move back up into the esophagus and cause symptoms or problems such as damage to the esophagus. Anti-reflux measures include: raising the head of the bed, avoiding tight clothing or belts, avoiding eating late at night, not lying down shortly after mealtime, and achieving weight loss. Avoid ASA, NSAID's, caffeine, alcohol, and tobacco.  OTC Pepcid and/or Tums are often very helpful for as needed use.  However, for persisting chronic or daily symptoms, stronger medications like Omeprazole may be needed. You may need to avoid foods and drinks such as: Coffee and tea (with or without caffeine). Drinks that contain alcohol. Energy drinks and sports drinks. Bubbly (carbonated) drinks or sodas. Chocolate and cocoa. Peppermint and mint flavorings. Garlic and onions. Horseradish. Spicy and acidic foods. These include peppers, chili powder, curry powder, vinegar, hot sauces, and BBQ sauce. Citrus fruit juices and citrus fruits, such as oranges, lemons, and limes. Tomato-based foods. These include red sauce, chili, salsa, and  pizza with red sauce. Fried and fatty foods. These include donuts, french fries, potato chips, and high-fat dressings. High-fat meats. These include hot dogs, rib eye steak, sausage, ham, and bacon.  Start- famotidine (PEPCID) 20 MG tablet; Take 1 tablet (20 mg total) by mouth 2 (two) times daily.  Dispense: 60 tablet; Refill: 0  2. Vitamin D deficiency Low Vitamin D level contributes to fatigue and  are associated with obesity, breast, and colon cancer. She agrees to continue to take prescription Vitamin D 50,000 IU every week and will follow-up for routine testing of Vitamin D, at least 2-3 times per year to avoid over-replacement.  Refill- Vitamin D, Ergocalciferol, (DRISDOL) 1.25 MG (50000 UNIT) CAPS capsule; Take 1 capsule (50,000 Units total) by mouth every 7 (seven) days. (OV for RF)  Dispense: 4 capsule; Refill: 0  3. Obesity with current BMI of 36.2  Sonia Beck is currently in the action stage of change. As such, her goal is to continue with weight loss efforts. She has agreed to the Category 1 Plan.   Exercise goals:  As is- Try to move more each day.  Behavioral modification strategies: emotional eating strategies and planning for success.  Sonia Beck has agreed to follow-up with our clinic in 3-4 weeks. She was informed of the importance of frequent follow-up visits to maximize her success with intensive lifestyle modifications for her multiple health conditions.   Objective:   Blood pressure 114/69, pulse 67, temperature 98.1 F (36.7 C), height 5' (1.524 m), weight 185 lb (83.9 kg), SpO2 97 %. Body mass index is 36.13 kg/m.  General: Cooperative, alert, well developed, in no acute distress. HEENT: Conjunctivae and lids unremarkable. Cardiovascular: Regular rhythm.  Lungs: Normal work of breathing. Neurologic: No focal deficits.   Lab Results  Component Value Date   CREATININE 0.9 11/13/2020   BUN 12 11/13/2020   NA 143 11/13/2020   K 4.2 11/13/2020   CL 103 11/13/2020   CO2 28 (A) 11/13/2020   Lab Results  Component Value Date   ALT 21 11/13/2020   AST 21 11/13/2020   ALKPHOS 106 11/13/2020   BILITOT 0.5 01/14/2016   Lab Results  Component Value Date   HGBA1C 6.5 11/13/2020   HGBA1C 6.2 04/26/2020   HGBA1C 6.1 03/13/2007   Lab Results  Component Value Date   INSULIN 9.9 05/02/2020   Lab Results  Component Value Date   TSH 0.821 05/02/2020   Lab  Results  Component Value Date   CHOL 171 11/13/2020   HDL 52 11/13/2020   LDLCALC 100 11/13/2020   TRIG 59 04/26/2020   Lab Results  Component Value Date   VD25OH 62.9 10/16/2020   VD25OH 20.9 (L) 05/02/2020   Lab Results  Component Value Date   WBC 8.0 11/13/2020   HGB 12.5 11/13/2020   HCT 37 11/13/2020   MCV 77.1 (L) 01/14/2016   PLT 473 (A) 11/13/2020   No results found for: IRON, TIBC, FERRITIN  Obesity Behavioral Intervention:   Approximately 15 minutes were spent on the discussion below.  ASK: We discussed the diagnosis of obesity with Sonia Beck today and Sonia Beck agreed to give [place] permission to discuss obesity behavioral modification therapy today.  ASSESS: Sonia Beck has the diagnosis of obesity and her BMI today is 36.2. Sonia Beck is in the action stage of change.   ADVISE: Sonia Beck was educated on the multiple health risks of obesity as well as the benefit of weight loss to improve her health. She was advised of the need  for long term treatment and the importance of lifestyle modifications to improve her current health and to decrease her risk of future health problems.  AGREE: Multiple dietary modification options and treatment options were discussed and Sonia Beck agreed to follow the recommendations documented in the above note.  ARRANGE: Sonia Beck was educated on the importance of frequent visits to treat obesity as outlined per CMS and USPSTF guidelines and agreed to schedule her next follow up appointment today.  Attestation Statements:   Reviewed by clinician on day of visit: allergies, medications, problem list, medical history, surgical history, family history, social history, and previous encounter notes.  Edmund Hilda, CMA, am acting as transcriptionist for [employer], DO.  I have reviewed the above documentation for accuracy and completeness, and I agree with the above. Carlye Grippe, D.O.  The 21st Century Cures Act was signed into  law in 2016 which includes the topic of electronic health records.  This provides immediate access to information in MyChart.  This includes consultation notes, operative notes, office notes, lab results and pathology reports.  If you have any questions about what you read please let us know at your next visit so we can discuss your concerns and take corrective action if need be.  We are right here with you.

## 2020-12-27 NOTE — Telephone Encounter (Signed)
Patient notified of advice from MD. She said she thinks her symptoms may have also been stress related, b/c after we spoke she felt better.

## 2021-01-08 ENCOUNTER — Other Ambulatory Visit: Payer: Self-pay | Admitting: Cardiology

## 2021-01-12 DIAGNOSIS — M25511 Pain in right shoulder: Secondary | ICD-10-CM | POA: Diagnosis not present

## 2021-01-12 DIAGNOSIS — M25512 Pain in left shoulder: Secondary | ICD-10-CM | POA: Diagnosis not present

## 2021-01-12 DIAGNOSIS — M25562 Pain in left knee: Secondary | ICD-10-CM | POA: Diagnosis not present

## 2021-01-12 DIAGNOSIS — M25561 Pain in right knee: Secondary | ICD-10-CM | POA: Diagnosis not present

## 2021-01-24 ENCOUNTER — Encounter (INDEPENDENT_AMBULATORY_CARE_PROVIDER_SITE_OTHER): Payer: Self-pay | Admitting: Family Medicine

## 2021-01-24 ENCOUNTER — Other Ambulatory Visit: Payer: Self-pay

## 2021-01-24 ENCOUNTER — Ambulatory Visit (INDEPENDENT_AMBULATORY_CARE_PROVIDER_SITE_OTHER): Payer: Medicare PPO | Admitting: Family Medicine

## 2021-01-24 VITALS — BP 145/82 | HR 64 | Temp 98.0°F | Ht 60.0 in | Wt 181.0 lb

## 2021-01-24 DIAGNOSIS — K2100 Gastro-esophageal reflux disease with esophagitis, without bleeding: Secondary | ICD-10-CM | POA: Diagnosis not present

## 2021-01-24 DIAGNOSIS — E559 Vitamin D deficiency, unspecified: Secondary | ICD-10-CM

## 2021-01-24 DIAGNOSIS — Z6841 Body Mass Index (BMI) 40.0 and over, adult: Secondary | ICD-10-CM | POA: Diagnosis not present

## 2021-01-24 MED ORDER — VITAMIN D (ERGOCALCIFEROL) 1.25 MG (50000 UNIT) PO CAPS: 50000.0000 [IU] | ORAL_CAPSULE | ORAL | 0 refills | Status: DC

## 2021-01-24 NOTE — Progress Notes (Signed)
Chief Complaint:   OBESITY Sonia Beck is here to discuss her progress with her obesity treatment plan along with follow-up of her obesity related diagnoses. Sonia Beck is on the Category 1 Plan and states she is following her eating plan approximately 60% of the time. 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  Today's visit was #: 13 Starting weight: 208 lbs Starting date: 05/02/2020 Today's weight: 181 lbs Today's date: 01/24/2021 Total lbs lost to date: 27 Total lbs lost since last in-office visit: 4  Interim History: Sonia Beck is participating with a [employer]. She has increased her activity level and feels stronger now.   Subjective:   1. Vitamin D deficiency She is currently taking prescription vitamin D 50,000 IU each week. She denies nausea, vomiting or muscle weakness.  Lab Results  Component Value Date   VD25OH 62.9 10/16/2020   VD25OH 20.9 (L) 05/02/2020   2. Gastroesophageal reflux disease with esophagitis, unspecified whether hemorrhage Pain in pt's chest has now resolved. She did see cardiologist and shortly after his reassurance, her pain resolved.  Assessment/Plan:  No orders of the defined types were placed in this encounter.   Medications Discontinued During This Encounter  Medication Reason   famotidine (PEPCID) 20 MG tablet No longer needed (for PRN medications)   Vitamin D, Ergocalciferol, (DRISDOL) 1.25 MG (50000 UNIT) CAPS capsule Reorder   famotidine (PEPCID) 20 MG tablet No longer needed (for PRN medications)     Meds ordered this encounter  Medications   Vitamin D, Ergocalciferol, (DRISDOL) 1.25 MG (50000 UNIT) CAPS capsule    Sig: Take 1 capsule (50,000 Units total) by mouth every 7 (seven) days. (OV for RF)    Dispense:  4 capsule    Refill:  0    Ov for RF     1. Vitamin D deficiency Low Vitamin D level contributes to fatigue and are associated with obesity, breast, and colon  cancer. She agrees to continue to take prescription Vitamin D 50,000 IU every week and will follow-up for routine testing of Vitamin D, at least 2-3 times per year to avoid over-replacement.  Refill- Vitamin D, Ergocalciferol, (DRISDOL) 1.25 MG (50000 UNIT) CAPS capsule; Take 1 capsule (50,000 Units total) by mouth every 7 (seven) days. (OV for RF)  Dispense: 4 capsule; Refill: 0  2. Gastroesophageal reflux disease with esophagitis, unspecified whether hemorrhage Discontinue Pepcid, as it is not longer needed.  3. Obesity with  current BMI of 35.5  Sonia Beck is currently in the action stage of change. As such, her goal is to continue with weight loss efforts. She has agreed to the Category 1 Plan.   Pt's goal is to increase exercise to 3 days a week.  Exercise goals:  As is  Behavioral modification strategies: increasing lean protein intake, decreasing simple carbohydrates, and planning for success.  Sonia Beck has agreed to follow-up with our clinic in 3-4 weeks. She was informed of the importance of frequent follow-up visits to maximize her success with intensive lifestyle modifications for her multiple health conditions.   Objective:   Blood pressure (!) 145/82, pulse 64, temperature 98 F (36.7 C), height 5' (1.524 m), weight 181 lb (82.1 kg), SpO2 99 %. Body mass index is 35.35 kg/m.  General: Cooperative, alert, well developed, in no acute distress. HEENT: Conjunctivae and lids unremarkable. Cardiovascular: Regular rhythm.  Lungs: Normal work of breathing. Neurologic: No focal deficits.   Lab Results  Component Value Date  CREATININE 0.9 11/13/2020   BUN 12 11/13/2020   NA 143 11/13/2020   K 4.2 11/13/2020   CL 103 11/13/2020   CO2 28 (A) 11/13/2020   Lab Results  Component Value Date   ALT 21 11/13/2020   AST 21 11/13/2020   ALKPHOS 106 11/13/2020   BILITOT 0.5 01/14/2016   Lab Results  Component Value Date   HGBA1C 6.5 11/13/2020   HGBA1C 6.2 04/26/2020    HGBA1C 6.1 03/13/2007   Lab Results  Component Value Date   INSULIN 9.9 05/02/2020   Lab Results  Component Value Date   TSH 0.821 05/02/2020   Lab Results  Component Value Date   CHOL 171 11/13/2020   HDL 52 11/13/2020   LDLCALC 100 11/13/2020   TRIG 59 04/26/2020   Lab Results  Component Value Date   VD25OH 62.9 10/16/2020   VD25OH 20.9 (L) 05/02/2020   Lab Results  Component Value Date   WBC 8.0 11/13/2020   HGB 12.5 11/13/2020   HCT 37 11/13/2020   MCV 77.1 (L) 01/14/2016   PLT 473 (A) 11/13/2020   No results found for: IRON, TIBC, FERRITIN  Obesity Behavioral Intervention:   Approximately 15 minutes were spent on the discussion below.  ASK: We discussed the diagnosis of obesity with Sonia Beck today and Sonia Beck agreed to give [place] permission to discuss obesity behavioral modification therapy today.  ASSESS: Sonia Beck has the diagnosis of obesity and her BMI today is 35.5. Sonia Beck is in the action stage of change.   ADVISE: Sonia Beck was educated on the multiple health risks of obesity as well as the benefit of weight loss to improve her health. She was advised of the need for long term treatment and the importance of lifestyle modifications to improve her current health and to decrease her risk of future health problems.  AGREE: Multiple dietary modification options and treatment options were discussed and Sonia Beck agreed to follow the recommendations documented in the above note.  ARRANGE: Sonia Beck was educated on the importance of frequent visits to treat obesity as outlined per CMS and USPSTF guidelines and agreed to schedule her next follow up appointment today.  Attestation Statements:   Reviewed by clinician on day of visit: allergies, medications, problem list, medical history, surgical history, family history, social history, and previous encounter notes.  Edmund Hilda, CMA, am acting as transcriptionist for [employer], DO.  I have reviewed  the above documentation for accuracy and completeness, and I agree with the above. Carlye Grippe, D.O.  The 21st Century Cures Act was signed into law in 2016 which includes the topic of electronic health records.  This provides immediate access to information in MyChart.  This includes consultation notes, operative notes, office notes, lab results and pathology reports.  If you have any questions about what you read please let us know at your next visit so we can discuss your concerns and take corrective action if need be.  We are right here with you.

## 2021-01-25 DIAGNOSIS — M25511 Pain in right shoulder: Secondary | ICD-10-CM | POA: Diagnosis not present

## 2021-01-25 DIAGNOSIS — M25512 Pain in left shoulder: Secondary | ICD-10-CM | POA: Diagnosis not present

## 2021-01-25 DIAGNOSIS — M25561 Pain in right knee: Secondary | ICD-10-CM | POA: Diagnosis not present

## 2021-01-25 DIAGNOSIS — M25562 Pain in left knee: Secondary | ICD-10-CM | POA: Diagnosis not present

## 2021-01-31 ENCOUNTER — Other Ambulatory Visit: Payer: Self-pay | Admitting: Internal Medicine

## 2021-01-31 DIAGNOSIS — Z1231 Encounter for screening mammogram for malignant neoplasm of breast: Secondary | ICD-10-CM

## 2021-02-09 DIAGNOSIS — M25562 Pain in left knee: Secondary | ICD-10-CM | POA: Diagnosis not present

## 2021-02-09 DIAGNOSIS — M25561 Pain in right knee: Secondary | ICD-10-CM | POA: Diagnosis not present

## 2021-02-09 DIAGNOSIS — M25511 Pain in right shoulder: Secondary | ICD-10-CM | POA: Diagnosis not present

## 2021-02-09 DIAGNOSIS — M25512 Pain in left shoulder: Secondary | ICD-10-CM | POA: Diagnosis not present

## 2021-02-15 DIAGNOSIS — M25561 Pain in right knee: Secondary | ICD-10-CM | POA: Diagnosis not present

## 2021-02-15 DIAGNOSIS — M25511 Pain in right shoulder: Secondary | ICD-10-CM | POA: Diagnosis not present

## 2021-02-15 DIAGNOSIS — M25512 Pain in left shoulder: Secondary | ICD-10-CM | POA: Diagnosis not present

## 2021-02-15 DIAGNOSIS — M25562 Pain in left knee: Secondary | ICD-10-CM | POA: Diagnosis not present

## 2021-02-19 ENCOUNTER — Other Ambulatory Visit: Payer: Self-pay

## 2021-02-19 ENCOUNTER — Ambulatory Visit (INDEPENDENT_AMBULATORY_CARE_PROVIDER_SITE_OTHER): Payer: Medicare PPO | Admitting: Family Medicine

## 2021-02-19 ENCOUNTER — Encounter (INDEPENDENT_AMBULATORY_CARE_PROVIDER_SITE_OTHER): Payer: Self-pay | Admitting: Family Medicine

## 2021-02-19 VITALS — BP 138/90 | HR 67 | Temp 98.2°F | Ht 60.0 in | Wt 182.0 lb

## 2021-02-19 DIAGNOSIS — R7303 Prediabetes: Secondary | ICD-10-CM

## 2021-02-19 DIAGNOSIS — E559 Vitamin D deficiency, unspecified: Secondary | ICD-10-CM | POA: Diagnosis not present

## 2021-02-19 DIAGNOSIS — Z6841 Body Mass Index (BMI) 40.0 and over, adult: Secondary | ICD-10-CM

## 2021-02-19 MED ORDER — VITAMIN D (ERGOCALCIFEROL) 1.25 MG (50000 UNIT) PO CAPS: 50000.0000 [IU] | ORAL_CAPSULE | ORAL | 0 refills | Status: DC

## 2021-02-20 LAB — COMPREHENSIVE METABOLIC PANEL
ALT: 11 IU/L (ref 0–32)
AST: 19 IU/L (ref 0–40)
Albumin/Globulin Ratio: 1.3 (ref 1.2–2.2)
Albumin: 4.6 g/dL (ref 3.7–4.7)
Alkaline Phosphatase: 100 IU/L (ref 44–121)
BUN/Creatinine Ratio: 17 (ref 12–28)
BUN: 15 mg/dL (ref 8–27)
Bilirubin Total: 0.3 mg/dL (ref 0.0–1.2)
CO2: 26 mmol/L (ref 20–29)
Calcium: 10.2 mg/dL (ref 8.7–10.3)
Chloride: 98 mmol/L (ref 96–106)
Creatinine, Ser: 0.9 mg/dL (ref 0.57–1.00)
Globulin, Total: 3.5 g/dL (ref 1.5–4.5)
Glucose: 89 mg/dL (ref 70–99)
Potassium: 4.5 mmol/L (ref 3.5–5.2)
Sodium: 139 mmol/L (ref 134–144)
Total Protein: 8.1 g/dL (ref 6.0–8.5)
eGFR: 68 mL/min/{1.73_m2} (ref >59)

## 2021-02-20 LAB — HEMOGLOBIN A1C
Est. average glucose Bld gHb Est-mCnc: 126 mg/dL
Hgb A1c MFr Bld: 6 % — ABNORMAL HIGH (ref 4.8–5.6)

## 2021-02-20 LAB — VITAMIN D 25 HYDROXY (VIT D DEFICIENCY, FRACTURES): Vit D, 25-Hydroxy: 61.6 ng/mL (ref 30.0–100.0)

## 2021-02-20 NOTE — Progress Notes (Signed)
Chief Complaint:   OBESITY Sonia Beck is here to discuss her progress with her obesity treatment plan along with follow-up of her obesity related diagnoses. Sonia Beck is on the Category 1 Plan and states she is following her eating plan approximately 40% of the time. 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  Today's visit was #: 14 Starting weight: 208 lbs Starting date: 05/02/2020 Today's weight: 182 lbs Today's date: 02/19/2021 Total lbs lost to date: 26 Total lbs lost since last in-office visit: 0  Interim History: Kataleia notes she is following he meal plan some days, as she doesn't feel it is enough and she notes hunger. She is keeping up with her exercise, and she loves Silver sneakers.  Subjective:   1. Pre-diabetes Sonia Beck notes some hunger lately, and carbohydrate cravings. Her last A1c on 11/13/2020 at her primary care physician's office was 6.5.  2. Vitamin D deficiency Sonia Beck is currently taking prescription vitamin D 50,000 IU each week. She denies nausea, vomiting or muscle weakness.  Assessment/Plan:   Orders Placed This Encounter  Procedures   Comprehensive metabolic panel   Hemoglobin A1c   VITAMIN D 25 Hydroxy (Vit-D Deficiency, Fractures)    Medications Discontinued During This Encounter  Medication Reason   Vitamin D, Ergocalciferol, (DRISDOL) 1.25 MG (50000 UNIT) CAPS capsule Reorder     Meds ordered this encounter  Medications   Vitamin D, Ergocalciferol, (DRISDOL) 1.25 MG (50000 UNIT) CAPS capsule    Sig: Take 1 capsule (50,000 Units total) by mouth every 7 (seven) days. (OV for RF)    Dispense:  8 capsule    Refill:  0    60 d supply;  OV for RF     1. Pre-diabetes We will check labs today. I gave Sonia Beck a handout on metformin, and she wants to think about it. She will continue to work on weight loss, exercise, and decreasing simple carbohydrates to help decrease the risk of diabetes.    - Comprehensive metabolic panel - Hemoglobin A1c  2. Vitamin D deficiency Low Vitamin D level contributes to fatigue and are associated with obesity, breast, and colon cancer. We will check labs today, and we will refill prescription Vitamin D for 1 month. Sonia Beck will follow-up for routine testing of Vitamin D, at least 2-3 times per year to avoid over-replacement.  - Vitamin D, Ergocalciferol, (DRISDOL) 1.25 MG (50000 UNIT) CAPS capsule; Take 1 capsule (50,000 Units total) by mouth every 7 (seven) days. (OV for RF)  Dispense: 8 capsule; Refill: 0 - VITAMIN D 25 Hydroxy (Vit-D Deficiency, Fractures)  3. Obesity with current BMI of 35.6 Sonia Beck is currently in the action stage of change. As such, her goal is to continue with weight loss efforts. She has agreed to the Category 1 Plan.   Exercise goals: As is.  Behavioral modification strategies: meal planning and cooking strategies.  Sonia Beck has agreed to follow-up with our clinic in 6 weeks per patient's request. She was informed of the importance of frequent follow-up visits to maximize her success with intensive lifestyle modifications for her multiple health conditions.   Sonia Beck was informed we would discuss her lab results at her next visit unless there is a critical issue that needs to be addressed sooner. Sonia Beck agreed to keep her next visit at the agreed upon time to discuss these results.  Objective:   Blood pressure 138/90, pulse 67, temperature 98.2 F (36.8 C), height 5' (1.524 m),  weight 182 lb (82.6 kg), SpO2 99 %. Body mass index is 35.54 kg/m.  General: Cooperative, alert, well developed, in no acute distress. HEENT: Conjunctivae and lids unremarkable. Cardiovascular: Regular rhythm.  Lungs: Normal work of breathing. Neurologic: No focal deficits.   Lab Results  Component Value Date   CREATININE 0.90 02/19/2021   BUN 15 02/19/2021   NA 139 02/19/2021   K 4.5 02/19/2021   CL 98 02/19/2021   CO2 26  02/19/2021   Lab Results  Component Value Date   ALT 11 02/19/2021   AST 19 02/19/2021   ALKPHOS 100 02/19/2021   BILITOT 0.3 02/19/2021   Lab Results  Component Value Date   HGBA1C 6.0 (H) 02/19/2021   HGBA1C 6.5 11/13/2020   HGBA1C 6.2 04/26/2020   HGBA1C 6.1 03/13/2007   Lab Results  Component Value Date   INSULIN 9.9 05/02/2020   Lab Results  Component Value Date   TSH 0.821 05/02/2020   Lab Results  Component Value Date   CHOL 171 11/13/2020   HDL 52 11/13/2020   LDLCALC 100 11/13/2020   TRIG 59 04/26/2020   Lab Results  Component Value Date   VD25OH 61.6 02/19/2021   VD25OH 62.9 10/16/2020   VD25OH 20.9 (L) 05/02/2020   Lab Results  Component Value Date   WBC 8.0 11/13/2020   HGB 12.5 11/13/2020   HCT 37 11/13/2020   MCV 77.1 (L) 01/14/2016   PLT 473 (A) 11/13/2020   No results found for: IRON, TIBC, FERRITIN  Obesity Behavioral Intervention:   Approximately 15 minutes were spent on the discussion below.  ASK: We discussed the diagnosis of obesity with Sonia Beck today and Sonia Beck agreed to give [place] permission to discuss obesity behavioral modification therapy today.  ASSESS: Sonia Beck has the diagnosis of obesity and her BMI today is 35.6. Sonia Beck is in the action stage of change.   ADVISE: Sonia Beck was educated on the multiple health risks of obesity as well as the benefit of weight loss to improve her health. She was advised of the need for long term treatment and the importance of lifestyle modifications to improve her current health and to decrease her risk of future health problems.  AGREE: Multiple dietary modification options and treatment options were discussed and Sonia Beck agreed to follow the recommendations documented in the above note.  ARRANGE: Sonia Beck was educated on the importance of frequent visits to treat obesity as outlined per CMS and USPSTF guidelines and agreed to schedule her next follow up appointment today.  Attestation  Statements:   Reviewed by clinician on day of visit: allergies, medications, problem list, medical history, surgical history, family history, social history, and previous encounter notes.   Wilhemena Durie, am acting as transcriptionist for [employer], DO.  I have reviewed the above documentation for accuracy and completeness, and I agree with the above. Marjory Sneddon, D.O.  The [place] was signed into law in 2016 which includes the topic of electronic health records.  This provides immediate access to information in MyChart.  This includes consultation notes, operative notes, office notes, lab results and pathology reports.  If you have any questions about what you read please let us know at your next visit so we can discuss your concerns and take corrective action if need be.  We are right here with you.

## 2021-02-22 NOTE — Progress Notes (Signed)
Cardiology Office Note   Date:  02/23/2021   ID:  Sonia Beck, DOB 03-09-1946, MRN [id number]  PCP:  Seward Carol, MD  Cardiologist:  Dr. Ellyn Hack  CC: Follow Up    History of Present Illness: Sonia Beck is a 75 y.o. female who presents for ongoing assessment and management of hypertension with Ed evaluation for hypertensive urgency in 2021, obesity, hyperlipidemia.  She was treated and have follow-up with Dr. Ellyn Hack on 11/08/2019.  She was also seen previously in the ED on 08/14/2019 due to a tick bite and was given 4-day course of doxycycline.  Last seen in the office on 11/08/2019 by Dr. Ellyn Hack for follow-up.  Blood pressure was 152/84.  She did have a 1/6 systolic murmur at the right upper sternal border with some chest wall tenderness and trivial bilateral lower extremity.  It was advised that the patient take her blood pressures at home and record, follow-up with the [facility] to get into an exercise program, and lose weight.    She was found to have some hyperlipidemia on prior lab review and was noted to be on Crestor 5 mg every Sunday.  Dr. Ellyn Hack wanted to consider increasing her Crestor to 2 doses a week or increasing her Crestor dose to 10 mg on Sunday, another option would be to add Zetia.  She was to follow-up with primary care.  She called our office on 12/26/2020 with complaints of chest discomfort and it was felt to be more consistent with musculoskeletal type symptoms with associated difficulty breathing.  She was seen by Dr. Raliegh Scarlet to discuss progress with her obesity treatment plan and weight loss management.  She was found to have vitamin D deficiency and was started on vitamin D replacement supplements.  She comes today in good spirits without complaints. She has engaged in an exercise program, going to nutrition counseling and psychological counseling, along with participation in [place] [employer] [place].  Past Medical History:  Diagnosis Date   Anxiety     Anxiety    Asthma    Back pain    Bone spur    bilateral shoulders   Cardiac murmur Echo October 2013   Aortic sclerosis, not stenosis   Chronic renal failure syndrome, stage 2 (mild)    Depression    Depression    Eczema    Gout    Heartburn    Hyperlipidemia    Hypertension    Joint pain    Obesity (BMI 30-39.9)    Former weight 224 pounds with BMI of 41 -- 65 pound weight loss in one year   Osteoarthritis    Prediabetes    [facility] spotted fever 2021   SOBOE (shortness of breath on exertion)    Thrombocytopenia (HCC)     Past Surgical History:  Procedure Laterality Date   ABDOMINAL HYSTERECTOMY  1991   COLONOSCOPY  2021   DOPPLER ECHOCARDIOGRAPHY  10/2016   mild LVH.  Focal basal.  EF 55 to 60%.  No R WMA.  GR 1 DD.  Mildly elevated PA pressures of 33 mmHg.  No obvious valve lesion to explain murmur   DOPPLER ECHOCARDIOGRAPHY  01/16/2012   ef =>55% lv norm   NM MYOCAR PERF WALL MOTION  01/30/2001   EF 69% ,LOW RISK,mild ant wall thinning due to breast attenuation     Current Outpatient Medications  Medication Sig Dispense Refill   acetaminophen (TYLENOL) 500 MG tablet Take 500 mg by mouth as needed. TAKE  2 TABLET AS NEEDED     ALPRAZolam (XANAX) 0.25 MG tablet Take 0.25 mg by mouth 2 (two) times daily as needed for anxiety.      amLODipine (NORVASC) 10 MG tablet Take 10 mg by mouth daily.      calcium-vitamin D (OSCAL WITH D) 500-200 MG-UNIT per tablet Take 1 tablet by mouth daily.     FLUoxetine (PROZAC) 20 MG capsule Take 20 mg by mouth daily.     montelukast (SINGULAIR) 10 MG tablet Take 10 mg by mouth at bedtime.     olmesartan (BENICAR) 20 MG tablet Take 1 tablet (20 mg total) by mouth daily. 30 tablet 5   pantoprazole (PROTONIX) 40 MG tablet      potassium chloride SA (K-DUR,KLOR-CON) 20 MEQ tablet Take 20 mEq by mouth daily.     rosuvastatin (CRESTOR) 5 MG tablet Take 5 mg by mouth every Sunday.      triamterene-hydrochlorothiazide (MAXZIDE-25)  37.5-25 MG per tablet Take 1 tablet by mouth daily.     Vitamin D, Ergocalciferol, (DRISDOL) 1.25 MG (50000 UNIT) CAPS capsule Take 1 capsule (50,000 Units total) by mouth every 7 (seven) days. (OV for RF) 8 capsule 0   No current facility-administered medications for this visit.    Allergies:   Erythromycin and Fosamax [alendronate]    Social History:  The patient  reports that she has never smoked. She has never used smokeless tobacco. She reports that she does not drink alcohol and does not use drugs.   Family History:  The patient's family history includes Cancer in her father; Heart attack (age of onset: 27) in her brother; Heart disease in her mother; Hyperlipidemia in her mother; Hypertension in her mother.    ROS: All other systems are reviewed and negative. Unless otherwise mentioned in H&P    PHYSICAL EXAM: VS:  BP (!) 152/80   Pulse 65   Ht 5' 1.5" (1.562 m)   Wt 185 lb 3.2 oz (84 kg)   BMI 34.43 kg/m  , BMI Body mass index is 34.43 kg/m. GEN: Well nourished, well developed, in no acute distress HEENT: normal Neck: no JVD, carotid bruits, or masses Cardiac: RRR; 1/6 systolic  murmurs, rubs, or gallops,no edema  Respiratory:  Clear to auscultation bilaterally, normal work of breathing GI: soft, nontender, nondistended, + BS MS: no deformity or atrophy Skin: warm and dry, no rash Neuro:  Strength and sensation are intact Psych: euthymic mood, full affect   EKG:  EKG is ordered today. The ekg ordered today demonstrates NSR with non-specific T-wave flattening in the lateral leads. Rate of 65 bpm.  (Personally reviewed).    Recent Labs: 05/02/2020: TSH 0.821 11/13/2020: Hemoglobin 12.5; Platelets 473 02/19/2021: ALT 11; BUN 15; Creatinine, Ser 0.90; Potassium 4.5; Sodium 139    Lipid Panel    Component Value Date/Time   CHOL 171 11/13/2020 0000   TRIG 59 04/26/2020 0000   HDL 52 11/13/2020 0000   LDLCALC 100 11/13/2020 0000      Wt Readings from Last 3  Encounters:  02/23/21 185 lb 3.2 oz (84 kg)  02/19/21 182 lb (82.6 kg)  01/24/21 181 lb (82.1 kg)      Other studies Reviewed: Echocardiogram: 10/11/2016 Left ventricle: The cavity size was normal. Wall thickness was    increased in a pattern of mild LVH. There was focal basal    hypertrophy. Systolic function was normal. The estimated ejection    fraction was in the range of 55% to 60%.  Wall motion was normal;    there were no regional wall motion abnormalities. Doppler    parameters are consistent with abnormal left ventricular    relaxation (grade 1 diastolic dysfunction).  - Pulmonary arteries: Systolic pressure was mildly increased. PA    peak pressure: 33 mm Hg (S).   ASSESSMENT AND PLAN:  1.  Hypertension: Slightly elevated today but has been lower in the past and at home.  No changes in regimen.  She has labs followed by PCP  2. Hyperlipidemia: Continues on statin therapy and with Crestor 5 mg once a week. Followed by [facility] with improvement in her status.  No changes in dose at this tie.   3. Vitamin D Deficiency: Supplement per PCP  4. Obesity: On weight loss and exercise program.  Continue to follow up with nutritionist.    Current medicines are reviewed at length with the patient today.  I have spent 25  dedicated to the care of this patient on the date of this encounter to include pre-visit review of records, assessment, management and diagnostic testing,with shared decision making.  Labs/ tests ordered today include: None Phill Myron. West Pugh, ANP, AACC   02/23/2021 12:28 PM    [facility] [place] [address] Office [phone] Fax [phone]  Notice: This dictation was prepared with Dragon dictation along with smaller phrase technology. Any transcriptional errors that result from this process are unintentional and may not be corrected upon review.

## 2021-02-23 ENCOUNTER — Other Ambulatory Visit: Payer: Self-pay

## 2021-02-23 ENCOUNTER — Encounter: Payer: Self-pay | Admitting: Adult Health

## 2021-02-23 ENCOUNTER — Ambulatory Visit: Payer: Medicare PPO | Admitting: Adult Health

## 2021-02-23 VITALS — BP 152/80 | HR 65 | Ht 61.5 in | Wt 185.2 lb

## 2021-02-23 DIAGNOSIS — E7800 Pure hypercholesterolemia, unspecified: Secondary | ICD-10-CM

## 2021-02-23 DIAGNOSIS — I10 Essential (primary) hypertension: Secondary | ICD-10-CM | POA: Diagnosis not present

## 2021-02-23 DIAGNOSIS — R011 Cardiac murmur, unspecified: Secondary | ICD-10-CM | POA: Diagnosis not present

## 2021-02-23 NOTE — Patient Instructions (Signed)
Medication Instructions:  No changes  *If you need a refill on your cardiac medications before your next appointment, please call your pharmacy*   Lab Work: Not needed    Testing/Procedures:  Will be schedule at [facility] [address] Your physician has requested that you have an echocardiogram. Echocardiography is a painless test that uses sound waves to create images of your heart. It provides your doctor with information about the size and shape of your heart and how well your heart's chambers and valves are working. This procedure takes approximately one hour. There are no restrictions for this procedure.   Follow-Up: At [facility], you and your health needs are our priority.  As part of our continuing mission to provide you with exceptional heart care, we have created designated Provider Care Teams.  These Care Teams include your primary Cardiologist (physician) and Advanced Practice Providers (APPs -  Physician Assistants and Nurse Practitioners) who all work together to provide you with the care you need, when you need it.     Your next appointment:   6 month(s)  The format for your next appointment:   In Person  Provider:   Bryan Lemma, MD    Other Instructions

## 2021-03-07 ENCOUNTER — Ambulatory Visit
Admission: RE | Admit: 2021-03-07 | Discharge: 2021-03-07 | Disposition: A | Discharge status: Home / Self Care | Payer: Medicare PPO | Source: Ambulatory Visit | Attending: Internal Medicine | Admitting: Internal Medicine

## 2021-03-07 DIAGNOSIS — Z1231 Encounter for screening mammogram for malignant neoplasm of breast: Secondary | ICD-10-CM

## 2021-04-08 HISTORY — PX: DOPPLER ECHOCARDIOGRAPHY: SHX263

## 2021-04-12 DIAGNOSIS — I10 Essential (primary) hypertension: Secondary | ICD-10-CM | POA: Diagnosis not present

## 2021-04-12 DIAGNOSIS — F32A Depression, unspecified: Secondary | ICD-10-CM | POA: Diagnosis not present

## 2021-04-12 DIAGNOSIS — F419 Anxiety disorder, unspecified: Secondary | ICD-10-CM | POA: Diagnosis not present

## 2021-04-12 DIAGNOSIS — E7800 Pure hypercholesterolemia, unspecified: Secondary | ICD-10-CM | POA: Diagnosis not present

## 2021-04-12 DIAGNOSIS — J4520 Mild intermittent asthma, uncomplicated: Secondary | ICD-10-CM | POA: Diagnosis not present

## 2021-04-12 DIAGNOSIS — E1169 Type 2 diabetes mellitus with other specified complication: Secondary | ICD-10-CM | POA: Diagnosis not present

## 2021-04-12 DIAGNOSIS — K219 Gastro-esophageal reflux disease without esophagitis: Secondary | ICD-10-CM | POA: Diagnosis not present

## 2021-04-16 ENCOUNTER — Ambulatory Visit (INDEPENDENT_AMBULATORY_CARE_PROVIDER_SITE_OTHER): Payer: Medicare PPO | Admitting: Family Medicine

## 2021-04-16 ENCOUNTER — Encounter (INDEPENDENT_AMBULATORY_CARE_PROVIDER_SITE_OTHER): Payer: Self-pay | Admitting: Family Medicine

## 2021-04-16 ENCOUNTER — Other Ambulatory Visit: Payer: Self-pay

## 2021-04-16 VITALS — BP 162/78 | HR 67 | Temp 97.8°F | Ht 60.0 in | Wt 191.0 lb

## 2021-04-16 DIAGNOSIS — E1169 Type 2 diabetes mellitus with other specified complication: Secondary | ICD-10-CM | POA: Diagnosis not present

## 2021-04-16 DIAGNOSIS — Z6837 Body mass index (BMI) 37.0-37.9, adult: Secondary | ICD-10-CM

## 2021-04-16 DIAGNOSIS — E559 Vitamin D deficiency, unspecified: Secondary | ICD-10-CM

## 2021-04-16 DIAGNOSIS — E1159 Type 2 diabetes mellitus with other circulatory complications: Secondary | ICD-10-CM

## 2021-04-16 DIAGNOSIS — I152 Hypertension secondary to endocrine disorders: Secondary | ICD-10-CM

## 2021-04-16 MED ORDER — VITAMIN D (ERGOCALCIFEROL) 1.25 MG (50000 UNIT) PO CAPS: 50000.0000 [IU] | ORAL_CAPSULE | ORAL | 0 refills | Status: DC

## 2021-04-17 ENCOUNTER — Ambulatory Visit (HOSPITAL_COMMUNITY): Payer: Medicare PPO

## 2021-04-18 NOTE — Progress Notes (Signed)
Chief Complaint:   OBESITY Sonia Beck is here to discuss her progress with her obesity treatment plan along with follow-up of her obesity related diagnoses. Sonia Beck is on the Category 1 Plan and states she is following her eating plan approximately 5% of the time. Sonia Beck states she is doing Chief of Staff for 45 minutes 2 times per week.( Which she has not been the month of Dec)  Today's visit was #: 15 Starting weight: 208 lbs Starting date: 05/02/2020 Today's weight: 191 lbs Today's date: 04/16/2021 Total lbs lost to date: 17 Total lbs lost since last in-office visit: 0  Interim History: Sonia Beck notes she "took December off" and did not follow the meal plan.  She is now ready to get back on the plan and eat/ get healthy.   Subjective:   1. Type 2 diabetes mellitus with other specified complication, without long-term current use of insulin (HCC) Sonia Beck brought in notes from her recent primary care physician's office, as she has recently changed doctors.   Her diabetes is diet controlled currently- no meds. Her A1c was at goal at 6.0 approximately 1 month ago.  She prefers to be on the "least amount of medicines as possible" and is not interested in starting one to aid with wt loss at this time.   2. Hypertension associated with type 2 diabetes mellitus ([place]) Sonia Beck is not checking her blood pressures at home, but at her recent primary care physician's appointment it was 130/70. She sees Sonia Beck, Cardiologist as well for this. She has an echocardiogram scheduled on January 18th.   3. Vitamin D deficiency Sonia Beck's last Vitamin D level was at goal at 61.6 approximately one month ago. She is currently taking prescription vitamin D 50,000 IU each week. She denies nausea, vomiting or muscle weakness.  Assessment/Plan:  No orders of the defined types were placed in this encounter.   Medications Discontinued During This Encounter  Medication Reason   Vitamin D,  Ergocalciferol, (DRISDOL) 1.25 MG (50000 UNIT) CAPS capsule Reorder     Meds ordered this encounter  Medications   Vitamin D, Ergocalciferol, (DRISDOL) 1.25 MG (50000 UNIT) CAPS capsule    Sig: Take 1 capsule (50,000 Units total) by mouth every 7 (seven) days. (OV for RF)    Dispense:  8 capsule    Refill:  0    60 d supply;  OV for RF     1. Type 2 diabetes mellitus with other specified complication, without long-term current use of insulin (HCC) We will continue to follow closely, and Sonia Beck will continue to follow up as directed. Good blood sugar control is important to decrease the likelihood of diabetic complications such as nephropathy, neuropathy, limb loss, blindness, coronary artery disease, and death. Intensive lifestyle modification including diet, exercise and weight loss are the first line of treatment for diabetes.   2. Hypertension associated with type 2 diabetes mellitus ([place]) Bp not at goal here today, but has been at other doctor's office visits. ( at her recent primary care physician's appointment it was 130/70.)   - She is not currently exercising and has slacked on her water intake recently.   Sonia Beck [place] is to check her blood pressure at home and bring in her log to her Cardiologist (who manages her medications), primary care physician, and [place] for review.   - She will continue her medications per Cards, and follow her prudent nutritional plan with goal of weight loss. We will continue to follow closely.  3.  Vitamin D deficiency We will refill prescription Vitamin D for 2 months. Sonia Beck will follow-up for routine testing of Vitamin D, at least 2-3 times per year to avoid over-replacement.  - Vitamin D, Ergocalciferol, (DRISDOL) 1.25 MG (50000 UNIT) CAPS capsule; Take 1 capsule (50,000 Units total) by mouth every 7 (seven) days. (OV for RF)  Dispense: 8 capsule; Refill: 0  4. Obesity with current BMI of 37.4 Sonia Beck is currently in the action stage of change. As  such, her goal is to continue with weight loss efforts. She has agreed to the Category 1 Plan.   Exercise goals: As is. [place] will start back with Silver sneakers exercise program 3 days per week, and start later this month.  Behavioral modification strategies: avoiding temptations and planning for success.  Sonia Beck has agreed to follow-up with our clinic in 3 weeks. She was informed of the importance of frequent follow-up visits to maximize her success with intensive lifestyle modifications for her multiple health conditions.    Objective:   Blood pressure (!) 162/78, pulse 67, temperature 97.8 F (36.6 C), height 5' (1.524 m), weight 191 lb (86.6 kg), SpO2 98 %. Body mass index is 37.3 kg/m.  General: Cooperative, alert, well developed, in no acute distress. HEENT: Conjunctivae and lids unremarkable. Cardiovascular: Regular rhythm.  Lungs: Normal work of breathing. Neurologic: No focal deficits.   Lab Results  Component Value Date   CREATININE 0.90 02/19/2021   BUN 15 02/19/2021   NA 139 02/19/2021   K 4.5 02/19/2021   CL 98 02/19/2021   CO2 26 02/19/2021   Lab Results  Component Value Date   ALT 11 02/19/2021   AST 19 02/19/2021   ALKPHOS 100 02/19/2021   BILITOT 0.3 02/19/2021   Lab Results  Component Value Date   HGBA1C 6.0 (H) 02/19/2021   HGBA1C 6.5 11/13/2020   HGBA1C 6.2 04/26/2020   HGBA1C 6.1 03/13/2007   Lab Results  Component Value Date   INSULIN 9.9 05/02/2020   Lab Results  Component Value Date   TSH 0.821 05/02/2020   Lab Results  Component Value Date   CHOL 171 11/13/2020   HDL 52 11/13/2020   LDLCALC 100 11/13/2020   TRIG 59 04/26/2020   Lab Results  Component Value Date   VD25OH 61.6 02/19/2021   VD25OH 62.9 10/16/2020   VD25OH 20.9 (L) 05/02/2020   Lab Results  Component Value Date   WBC 8.0 11/13/2020   HGB 12.5 11/13/2020   HCT 37 11/13/2020   MCV 77.1 (L) 01/14/2016   PLT 473 (A) 11/13/2020   No results found for:  IRON, TIBC, FERRITIN  Obesity Behavioral Intervention:   Approximately 15 minutes were spent on the discussion below.  ASK: We discussed the diagnosis of obesity with Sonia Beck today and Sonia Beck agreed to give [place] permission to discuss obesity behavioral modification therapy today.  ASSESS: Sonia Beck has the diagnosis of obesity and her BMI today is 37.4. Sonia Beck is in the action stage of change.   ADVISE: Sonia Beck was educated on the multiple health risks of obesity as well as the benefit of weight loss to improve her health. She was advised of the need for long term treatment and the importance of lifestyle modifications to improve her current health and to decrease her risk of future health problems.  AGREE: Multiple dietary modification options and treatment options were discussed and Sonia Beck agreed to follow the recommendations documented in the above note.  ARRANGE: Sonia Beck was educated on the importance of frequent  visits to treat obesity as outlined per CMS and USPSTF guidelines and agreed to schedule her next follow up appointment today.  Attestation Statements:   Reviewed by clinician on day of visit: allergies, medications, problem list, medical history, surgical history, family history, social history, and previous encounter notes.   Wilhemena Durie, am acting as transcriptionist for [employer], DO.  I have reviewed the above documentation for accuracy and completeness, and I agree with the above. Sonia Beck, D.O.  The [place] was signed into law in 2016 which includes the topic of electronic health records.  This provides immediate access to information in MyChart.  This includes consultation notes, operative notes, office notes, lab results and pathology reports.  If you have any questions about what you read please let us know at your next visit so we can discuss your concerns and take corrective action if need be.  We are right here with  you.

## 2021-04-25 ENCOUNTER — Other Ambulatory Visit: Payer: Self-pay

## 2021-04-25 ENCOUNTER — Ambulatory Visit (HOSPITAL_COMMUNITY): Payer: Medicare PPO | Attending: Cardiology

## 2021-04-25 DIAGNOSIS — R011 Cardiac murmur, unspecified: Secondary | ICD-10-CM

## 2021-04-25 DIAGNOSIS — I350 Nonrheumatic aortic (valve) stenosis: Secondary | ICD-10-CM | POA: Insufficient documentation

## 2021-04-25 LAB — ECHOCARDIOGRAM COMPLETE
AR max vel: 0.99 cm2
AV Area VTI: 1.04 cm2
AV Area mean vel: 1.05 cm2
AV Mean grad: 13 mmHg
AV Peak grad: 28.7 mmHg
Ao pk vel: 2.68 m/s
Area-P 1/2: 3.19 cm2
S' Lateral: 2.5 cm

## 2021-05-02 ENCOUNTER — Telehealth: Payer: Self-pay

## 2021-05-02 NOTE — Telephone Encounter (Addendum)
Called patient regarding results of echocardiogram. Patient had understanding of results.----- Message from Jodelle Gross, NP sent at 04/26/2021  7:52 PM EST ----- I have reviewed her echo report. Normal heart pumping function, slight stiffening during relaxation. She has mld aortic valve stiffening, but not anything to worry about right now.  This is the murmur we heard.  Continue medications as directed.   KL

## 2021-05-07 ENCOUNTER — Encounter (INDEPENDENT_AMBULATORY_CARE_PROVIDER_SITE_OTHER): Payer: Self-pay | Admitting: Family Medicine

## 2021-05-07 ENCOUNTER — Ambulatory Visit (INDEPENDENT_AMBULATORY_CARE_PROVIDER_SITE_OTHER): Payer: Medicare PPO | Admitting: Family Medicine

## 2021-05-07 ENCOUNTER — Other Ambulatory Visit: Payer: Self-pay

## 2021-05-07 VITALS — BP 159/88 | HR 85 | Temp 98.2°F | Ht 60.0 in | Wt 192.0 lb

## 2021-05-07 DIAGNOSIS — E1159 Type 2 diabetes mellitus with other circulatory complications: Secondary | ICD-10-CM | POA: Diagnosis not present

## 2021-05-07 DIAGNOSIS — E559 Vitamin D deficiency, unspecified: Secondary | ICD-10-CM | POA: Diagnosis not present

## 2021-05-07 DIAGNOSIS — F39 Unspecified mood [affective] disorder: Secondary | ICD-10-CM

## 2021-05-07 DIAGNOSIS — E1169 Type 2 diabetes mellitus with other specified complication: Secondary | ICD-10-CM | POA: Diagnosis not present

## 2021-05-07 DIAGNOSIS — E669 Obesity, unspecified: Secondary | ICD-10-CM | POA: Diagnosis not present

## 2021-05-07 DIAGNOSIS — I152 Hypertension secondary to endocrine disorders: Secondary | ICD-10-CM

## 2021-05-07 DIAGNOSIS — Z6841 Body Mass Index (BMI) 40.0 and over, adult: Secondary | ICD-10-CM

## 2021-05-07 DIAGNOSIS — Z6837 Body mass index (BMI) 37.0-37.9, adult: Secondary | ICD-10-CM

## 2021-05-08 NOTE — Progress Notes (Signed)
Chief Complaint:   OBESITY Sonia Beck is here to discuss her progress with her obesity treatment plan along with follow-up of her obesity related diagnoses. Sonia Beck is on the Category 1 Plan and states she is following her eating plan approximately 10% of the time. Sonia Beck states she is doing 0 minutes 0 times per week.  Today's visit was #: 82 Starting weight: 208 lbs Starting date: 05/02/2020 Today's weight: 192 lbs Today's date: 05/07/2021 Total lbs lost to date: 16 Total lbs lost since last in-office visit: 0  Interim History: Sonia Beck's goal was to restart Silver sneakers, but she hasn't yet. She notes it is difficult to get back into healthier habits the last few weeks. She wishes to lengthen out her office visits, and came every month in the future.   Subjective:   1. Type 2 diabetes mellitus with other specified complication, without long-term current use of insulin ([place]) Sonia Beck's last A1c was 6.0 on 02/19/2021, under good control. She still has carbohydrate cravings though. She is not on medications currently. I discussed labs with the patient today.  2. Hypertension associated with type 2 diabetes mellitus ([place]) Sonia Beck is not checking her blood pressure at home. Her blood pressure always goes up because of her nerves- white coat on top of her HTN.  - She declined a blood pressure recheck today because of her nerves. Her echocardiogram done on 04/25/2021 was within normal limits; I reviewed it with the patient today.  3. Mood disorder ([place]) Sonia Beck thinks she has post traumatic stress disorder issues regarding her grandson and that she is possibly depressed as well. She is taking Prozac yet doesn't feel she needs a change in her med management. No SI.   4. Vitamin D deficiency Sonia Beck is tolerating medication(s) well without side effects.  Medication compliance is good and patient appears to be taking it as prescribed.  Denies additional concerns regarding  this condition.     Assessment/Plan:   1. Type 2 diabetes mellitus with other specified complication, without long-term current use of insulin (HCC) At goal/ stable.   We will consider rechecking labs in the next 1-2 visits.  - A1c 11/22 is 6.0. Sonia Beck declines medications (metformin) today, but she will read about it. Handout was given and counseling was done on decreasing carb intake and various that can help pt do that.   2. Hypertension associated with type 2 diabetes mellitus (HCC) BP is not at goal today but pt has white coat syndrome on top of her HTN.  - She declined a blood pressure recheck today by [place] today "because of her nerves" it always goes up and she is already bothered/nervious today.  Sonia Beck will continue with medication management per her Cardiologist and primary care physician. I recommended that she start checking her blood pressure at home along with her specialists, as her blood pressure is not at goal in the office today.  3. Mood disorder (Sonia Beck) Sonia Beck will continue Prozac- she declines a need for dose change. I gave the patient a list of counselors in the area, and she will look into insurance coverage and make an appointment for cognitive behavorial therapy.  - We discussed how eating healthy and increasing her physical activity can improve her mood   4. Vitamin D deficiency Sonia Beck will continue prescription Vitamin D 50,000 IU every week. She declines a need for a refill. She will follow-up for routine testing of Vitamin D, at least 2-3 times per year to avoid over-replacement.  5. Obesity with current BMI of 37.6 Sonia Beck is currently in the action stage of change. As such, her goal is to continue with weight loss efforts. She has agreed to the Category 1 Plan.   Exercise goals: All adults should avoid inactivity. Some physical activity is better than none, and adults who participate in any amount of physical activity gain some health  benefits.  Behavioral modification strategies: increasing lean protein intake, better snacking choices (sweets), and avoiding temptations.  Sonia Beck has agreed to follow-up with our clinic in 4 weeks per her request. She was informed of the importance of frequent follow-up visits to maximize her success with intensive lifestyle modifications for her multiple health conditions.   Objective:   Blood pressure (!) 159/88, pulse 85, temperature 98.2 F (36.8 C), height 5' (1.524 m), weight 192 lb (87.1 kg), SpO2 98 %. Body mass index is 37.5 kg/m.  General: Cooperative, alert, well developed, in no acute distress. HEENT: Conjunctivae and lids unremarkable. Cardiovascular: Regular rhythm.  Lungs: Normal work of breathing. Neurologic: No focal deficits.   Lab Results  Component Value Date   CREATININE 0.90 02/19/2021   BUN 15 02/19/2021   NA 139 02/19/2021   K 4.5 02/19/2021   CL 98 02/19/2021   CO2 26 02/19/2021   Lab Results  Component Value Date   ALT 11 02/19/2021   AST 19 02/19/2021   ALKPHOS 100 02/19/2021   BILITOT 0.3 02/19/2021   Lab Results  Component Value Date   HGBA1C 6.0 (H) 02/19/2021   HGBA1C 6.5 11/13/2020   HGBA1C 6.2 04/26/2020   HGBA1C 6.1 03/13/2007   Lab Results  Component Value Date   INSULIN 9.9 05/02/2020   Lab Results  Component Value Date   TSH 0.821 05/02/2020   Lab Results  Component Value Date   CHOL 171 11/13/2020   HDL 52 11/13/2020   LDLCALC 100 11/13/2020   TRIG 59 04/26/2020   Lab Results  Component Value Date   VD25OH 61.6 02/19/2021   VD25OH 62.9 10/16/2020   VD25OH 20.9 (L) 05/02/2020   Lab Results  Component Value Date   WBC 8.0 11/13/2020   HGB 12.5 11/13/2020   HCT 37 11/13/2020   MCV 77.1 (L) 01/14/2016   PLT 473 (A) 11/13/2020   No results found for: IRON, TIBC, FERRITIN  Obesity Behavioral Intervention:   Approximately 15 minutes were spent on the discussion below.  ASK: We discussed the diagnosis of  obesity with Sonia Beck today and Sonia Beck agreed to give [place] permission to discuss obesity behavioral modification therapy today.  ASSESS: Sonia Beck has the diagnosis of obesity and her BMI today is 37.6. Sonia Beck is in the action stage of change.   ADVISE: Sonia Beck was educated on the multiple health risks of obesity as well as the benefit of weight loss to improve her health. She was advised of the need for long term treatment and the importance of lifestyle modifications to improve her current health and to decrease her risk of future health problems.  AGREE: Multiple dietary modification options and treatment options were discussed and Sonia Beck agreed to follow the recommendations documented in the above note.  ARRANGE: Sonia Beck was educated on the importance of frequent visits to treat obesity as outlined per CMS and USPSTF guidelines and agreed to schedule her next follow up appointment today.  Attestation Statements:   Reviewed by clinician on day of visit: allergies, medications, problem list, medical history, surgical history, family history, social history, and previous encounter notes.  I,  Sonia Beck, am acting as transcriptionist for [employer], DO.  I have reviewed the above documentation for accuracy and completeness, and I agree with the above. Sonia Beck Sneddon, D.O.  The [place] was signed into law in 2016 which includes the topic of electronic health records.  This provides immediate access to information in MyChart.  This includes consultation notes, operative notes, office notes, lab results and pathology reports.  If you have any questions about what you read please let us know at your next visit so we can discuss your concerns and take corrective action if need be.  We are right here with you.

## 2021-05-22 DIAGNOSIS — H04123 Dry eye syndrome of bilateral lacrimal glands: Secondary | ICD-10-CM | POA: Diagnosis not present

## 2021-05-22 DIAGNOSIS — H25813 Combined forms of age-related cataract, bilateral: Secondary | ICD-10-CM | POA: Diagnosis not present

## 2021-05-22 DIAGNOSIS — H40013 Open angle with borderline findings, low risk, bilateral: Secondary | ICD-10-CM | POA: Diagnosis not present

## 2021-06-06 ENCOUNTER — Ambulatory Visit (INDEPENDENT_AMBULATORY_CARE_PROVIDER_SITE_OTHER): Payer: Medicare PPO | Admitting: Family Medicine

## 2021-06-07 ENCOUNTER — Other Ambulatory Visit: Payer: Self-pay

## 2021-06-07 ENCOUNTER — Encounter (INDEPENDENT_AMBULATORY_CARE_PROVIDER_SITE_OTHER): Payer: Self-pay | Admitting: Family Medicine

## 2021-06-07 ENCOUNTER — Ambulatory Visit (INDEPENDENT_AMBULATORY_CARE_PROVIDER_SITE_OTHER): Payer: Medicare PPO | Admitting: Family Medicine

## 2021-06-07 VITALS — BP 139/78 | HR 73 | Temp 98.2°F | Ht 60.0 in | Wt 185.0 lb

## 2021-06-07 DIAGNOSIS — Z6836 Body mass index (BMI) 36.0-36.9, adult: Secondary | ICD-10-CM | POA: Diagnosis not present

## 2021-06-07 DIAGNOSIS — E1169 Type 2 diabetes mellitus with other specified complication: Secondary | ICD-10-CM | POA: Diagnosis not present

## 2021-06-07 DIAGNOSIS — Z6841 Body Mass Index (BMI) 40.0 and over, adult: Secondary | ICD-10-CM

## 2021-06-07 DIAGNOSIS — E559 Vitamin D deficiency, unspecified: Secondary | ICD-10-CM | POA: Diagnosis not present

## 2021-06-07 DIAGNOSIS — E1159 Type 2 diabetes mellitus with other circulatory complications: Secondary | ICD-10-CM

## 2021-06-07 DIAGNOSIS — E669 Obesity, unspecified: Secondary | ICD-10-CM

## 2021-06-07 DIAGNOSIS — I152 Hypertension secondary to endocrine disorders: Secondary | ICD-10-CM

## 2021-06-07 MED ORDER — VITAMIN D (ERGOCALCIFEROL) 1.25 MG (50000 UNIT) PO CAPS: 50000.0000 [IU] | ORAL_CAPSULE | ORAL | 0 refills | Status: DC

## 2021-06-11 NOTE — Progress Notes (Signed)
? ? ? ?Chief Complaint:  ? ?OBESITY ?Sonia Beck is here to discuss her progress with her obesity treatment plan along with follow-up of her obesity related diagnoses. Sonia Beck is on the Category 1 Plan and states she is following her eating plan approximately 85% of the time. Sonia Beck states she is doing Chief of Staff for 45 minutes 1 time per week, Tai chi for 45 minutes 1 time per week, and walking for 10 minutes 6 times per week. ? ?Today's visit was #: 75 ?Starting weight: 208 lbs ?Starting date: 05/02/2020 ?Today's weight: 185 lbs ?Today's date: 06/07/2021 ?Total lbs lost to date: 74 ?Total lbs lost since last in-office visit: 7 ? ?Interim History: Sonia Beck reports the last 2 weeks she has been on the plan almost 90% of the time; not prior weeks. Now more energy and increased activity. She planned her meals more. She started Tai chi yesterday. Overall she loves how she feels when she is on the plan and eating healthy. ? ? ? ?Subjective:  ? ?1. Type 2 diabetes mellitus with other specified complication, without long-term current use of insulin (Sonia Beck) ?Sonia Beck's diabetes is diet controlled and she prefers it, that way (wants to avoid medications if possible). Her last A1c 3 months ago was 6.0.  ? ?2. Hypertension associated with diabetes (Sonia Beck) ?Sonia Beck is asymptomatic, with no concerns or issues. She is taking Maxzide, Benicar, and Norvasc. ? ?BP Readings from Last 3 Encounters:  ?06/07/21 139/78  ?05/07/21 (!) 159/88  ?04/16/21 (!) 162/78  ? ?3. Vitamin D deficiency ?Sonia Beck is tolerating medication(s) well without side effects.  Medication compliance is good and patient appears to be taking it as prescribed.  The patient denies additional concerns regarding this condition.  ? ? ? ?Assessment/Plan:  ? ?Medications Discontinued During This Encounter  ?Medication Reason  ? Vitamin D, Ergocalciferol, (DRISDOL) 1.25 MG (50000 UNIT) CAPS capsule Reorder  ?  ? ?Meds ordered this encounter  ?Medications  ? Vitamin D,  Ergocalciferol, (DRISDOL) 1.25 MG (50000 UNIT) CAPS capsule  ?  Sig: Take 1 capsule (50,000 Units total) by mouth every 7 (seven) days. (OV for RF)  ?  Dispense:  4 capsule  ?  Refill:  0  ?  60 d supply;  OV for RF  ?  ? ?1. Type 2 diabetes mellitus with other specified complication, without long-term current use of insulin (Sonia Beck) ?A1c at 6.0-  at goal. Sonia Beck denies carbohydrate cravings currently when she is eating on the plan. She will continue her prudent nutritional plan and weight loss.She tells me Dr Delfina Redwood will likely get labs in the near future and declines them today ? ? ? ?2. Hypertension associated with diabetes (Sonia Beck) ?Sonia Beck's blood pressure is at goal. She will continue her medications per her PCP/specialists. She will continue with low salt and decrease prepared meals. ? ? ? ?3. Vitamin D deficiency ?Vit D at goal at 61 about 3 mo ago.  We will refill prescription Vitamin D for 1 month. Lakyia will follow-up for routine testing of Vitamin D, at least 2-3 times per year to avoid over-replacement. ? ?- Vitamin D, Ergocalciferol, (DRISDOL) 1.25 MG (50000 UNIT) CAPS capsule; Take 1 capsule (50,000 Units total) by mouth every 7 (seven) days. (OV for RF)  Dispense: 4 capsule; Refill: 0 ? ? ? ?4. Obesity with current BMI of 36.3 ?Sonia Beck is currently in the action stage of change. As such, her goal is to continue with weight loss efforts. She has agreed to the Category 1 Plan w  Sonia Beck options.  ? ?Exercise goals: Older adults should follow the adult guidelines. When older adults cannot meet the adult guidelines, they should be as physically active as their abilities and conditions will allow.  ? ?Behavioral modification strategies: decreasing simple carbohydrates, meal planning and cooking strategies, and planning for success. ? ?Sonia Beck has agreed to follow-up with our clinic in 4 weeks. She was informed of the importance of frequent follow-up visits to maximize her success with intensive lifestyle  modifications for her multiple health conditions.  ? ? ? ?Objective:  ? ?Blood pressure 139/78, pulse 73, temperature 98.2 ?F (36.8 ?C), height 5' (1.524 m), weight 185 lb (83.9 kg), SpO2 98 %. ?Body mass index is 36.13 kg/m?. ? ?General: Cooperative, alert, well developed, in no acute distress. ?HEENT: Conjunctivae and lids unremarkable. ?Cardiovascular: Regular rhythm.  ?Lungs: Normal work of breathing. ?Neurologic: No focal deficits.  ? ?Lab Results  ?Component Value Date  ? CREATININE 0.90 02/19/2021  ? BUN 15 02/19/2021  ? NA 139 02/19/2021  ? K 4.5 02/19/2021  ? CL 98 02/19/2021  ? CO2 26 02/19/2021  ? ?Lab Results  ?Component Value Date  ? ALT 11 02/19/2021  ? AST 19 02/19/2021  ? ALKPHOS 100 02/19/2021  ? BILITOT 0.3 02/19/2021  ? ?Lab Results  ?Component Value Date  ? HGBA1C 6.0 (H) 02/19/2021  ? HGBA1C 6.5 11/13/2020  ? HGBA1C 6.2 04/26/2020  ? HGBA1C 6.1 03/13/2007  ? ?Lab Results  ?Component Value Date  ? INSULIN 9.9 05/02/2020  ? ?Lab Results  ?Component Value Date  ? TSH 0.821 05/02/2020  ? ?Lab Results  ?Component Value Date  ? CHOL 171 11/13/2020  ? HDL 52 11/13/2020  ? [place] 100 11/13/2020  ? TRIG 59 04/26/2020  ? ?Lab Results  ?Component Value Date  ? VD25OH 61.6 02/19/2021  ? VD25OH 62.9 10/16/2020  ? VD25OH 20.9 (L) 05/02/2020  ? ?Lab Results  ?Component Value Date  ? WBC 8.0 11/13/2020  ? HGB 12.5 11/13/2020  ? HCT 37 11/13/2020  ? MCV 77.1 (L) 01/14/2016  ? PLT 473 (A) 11/13/2020  ? ?No results found for: IRON, TIBC, FERRITIN ? ? ?Obesity Behavioral Intervention:  ? ?Approximately 15 minutes were spent on the discussion below. ? ?ASK: ?We discussed the diagnosis of obesity with Sonia Beck today and Sonia Beck agreed to give [place] permission to discuss obesity behavioral modification therapy today. ? ?ASSESS: ?Sonia Beck has the diagnosis of obesity and her BMI today is 36.3. Sonia Beck is in the action stage of change.  ? ?ADVISE: ?Sonia Beck was educated on the multiple health risks of obesity as well as  the benefit of weight loss to improve her health. She was advised of the need for long term treatment and the importance of lifestyle modifications to improve her current health and to decrease her risk of future health problems. ? ?AGREE: ?Multiple dietary modification options and treatment options were discussed and Sonia Beck agreed to follow the recommendations documented in the above note. ? ?ARRANGE: ?Sonia Beck was educated on the importance of frequent visits to treat obesity as outlined per CMS and USPSTF guidelines and agreed to schedule her next follow up appointment today. ? ?Attestation Statements:  ? ?Reviewed by clinician on day of visit: allergies, medications, problem list, medical history, surgical history, family history, social history, and previous encounter notes. ? ?I, Trixie Dredge, am acting as transcriptionist for [employer], DO. ? ?I have reviewed the above documentation for accuracy and completeness, and I agree with the above. Neoma Laming  J Aramis Zobel, D.O. ? ?The [place] was signed into law in 2016 which includes the topic of electronic health records.  This provides immediate access to information in MyChart.  This includes consultation notes, operative notes, office notes, lab results and pathology reports.  If you have any questions about what you read please let us know at your next visit so we can discuss your concerns and take corrective action if need be.  We are right here with you. ? ? ?

## 2021-07-02 ENCOUNTER — Other Ambulatory Visit: Payer: Self-pay | Admitting: Cardiology

## 2021-07-05 ENCOUNTER — Ambulatory Visit (INDEPENDENT_AMBULATORY_CARE_PROVIDER_SITE_OTHER): Payer: Medicare PPO | Admitting: Family Medicine

## 2021-07-05 ENCOUNTER — Encounter (INDEPENDENT_AMBULATORY_CARE_PROVIDER_SITE_OTHER): Payer: Self-pay | Admitting: Family Medicine

## 2021-07-05 VITALS — BP 144/81 | HR 64 | Temp 98.3°F | Ht 60.0 in | Wt 188.0 lb

## 2021-07-05 DIAGNOSIS — E1169 Type 2 diabetes mellitus with other specified complication: Secondary | ICD-10-CM

## 2021-07-05 DIAGNOSIS — E559 Vitamin D deficiency, unspecified: Secondary | ICD-10-CM | POA: Diagnosis not present

## 2021-07-05 DIAGNOSIS — E669 Obesity, unspecified: Secondary | ICD-10-CM

## 2021-07-05 DIAGNOSIS — E1159 Type 2 diabetes mellitus with other circulatory complications: Secondary | ICD-10-CM

## 2021-07-05 DIAGNOSIS — I152 Hypertension secondary to endocrine disorders: Secondary | ICD-10-CM

## 2021-07-05 DIAGNOSIS — Z6836 Body mass index (BMI) 36.0-36.9, adult: Secondary | ICD-10-CM

## 2021-07-05 MED ORDER — VITAMIN D (ERGOCALCIFEROL) 1.25 MG (50000 UNIT) PO CAPS: 50000.0000 [IU] | ORAL_CAPSULE | ORAL | 0 refills | Status: DC

## 2021-07-11 NOTE — Progress Notes (Signed)
? ? ? ?Chief Complaint:  ? ?OBESITY ?Sonia Beck is here to discuss her progress with her obesity treatment plan along with follow-up of her obesity related diagnoses. Sonia Beck is on the Category 1 Plan with breakfast options and states she is following her eating plan approximately 50% of the time. Sonia Beck states she is doing Chief of Staff, tai chi, and walking for 45 minutes 3-5 times per week. ? ?Today's visit was #: 18 ?Starting weight: 208 lbs ?Starting date: 05/02/2020 ?Today's weight: 188 lbs ?Today's date: 07/05/2021 ?Total lbs lost to date: 90 ?Total lbs lost since last in-office visit: 0 ? ?Interim History: Sonia Beck challenges lately and has had "special occasions", where she is eating off the plan. ? ?Subjective:  ? ?1. Hypertension associated with type 2 diabetes mellitus ([place]) ?Indica's blood pressure is at goal today.  ? ?2. Vitamin D deficiency ?Sonia Beck is currently taking prescription vitamin D 50,000 IU each week. She denies nausea, vomiting or muscle weakness. ? ?3. Type 2 diabetes mellitus with other specified complication, without long-term current use of insulin ([place]) ?Sonia Beck still declines medications and she states that at her next office visit, if she cant avoid temptations then she will entertain medication options. I discussed latest A1c with the patient today.  ? ?Assessment/Plan:  ? ?Orders Placed This Encounter  ?Procedures  ? VITAMIN D 25 Hydroxy (Vit-D Deficiency, Fractures)  ? Hemoglobin A1c  ? ? ?Medications Discontinued During This Encounter  ?Medication Reason  ? Vitamin D, Ergocalciferol, (DRISDOL) 1.25 MG (50000 UNIT) CAPS capsule Reorder  ?  ? ?Meds ordered this encounter  ?Medications  ? Vitamin D, Ergocalciferol, (DRISDOL) 1.25 MG (50000 UNIT) CAPS capsule  ?  Sig: Take 1 capsule (50,000 Units total) by mouth every 7 (seven) days. (OV for RF)  ?  Dispense:  4 capsule  ?  Refill:  0  ?  60 d supply;  OV for RF  ?  ? ?1. Hypertension associated with type 2 diabetes mellitus  ([place]) ?Sonia Beck's blood pressure is essentially at goal for her age, but prefer <140/90 due to diabetes mellitus. She will continue Norvasc, Benicar, and Maxzide.  ? ?2. Vitamin D deficiency ?We will refill prescription Vitamin D for 1 month. Sonia Beck will continue [place] with D; every 2 year Bone density. We will recheck her Vit D level at her next office visit as she declines today. ? ?- Vitamin D, Ergocalciferol, (DRISDOL) 1.25 MG (50000 UNIT) CAPS capsule; Take 1 capsule (50,000 Units total) by mouth every 7 (seven) days. (OV for RF)  Dispense: 4 capsule; Refill: 0 ?- VITAMIN D 25 Hydroxy (Vit-D Deficiency, Fractures) ? ?3. Type 2 diabetes mellitus with other specified complication, without long-term current use of insulin ([place]) ?We will recheck A1c at her next office visit as she declines to have it checked today. Sonia Beck will continue to diet control her diabetes mellitus, and increase activity and weight loss. ? ?- Hemoglobin A1c ? ?4. Obesity with current BMI of 36.8 ?Sonia Beck is currently in the action stage of change. As such, her goal is to continue with weight loss efforts. She has agreed to the Category 1 Plan with breakfast options.  ? ?Sonia Beck will get back on the meal plan and eat lunch and dinner on the plan. We will check labs at her next office visit, non-fasting. ? ?Exercise goals: As is. ? ?Behavioral modification strategies: decreasing simple carbohydrates, better snacking choices, and avoiding temptations. ? ?Sonia Beck has agreed to follow-up with our clinic in 3 weeks. She was informed  of the importance of frequent follow-up visits to maximize her success with intensive lifestyle modifications for her multiple health conditions.  ? ?Objective:  ? ?Blood pressure (!) 144/81, pulse 64, temperature 98.3 ?F (36.8 ?C), height 5' (1.524 m), weight 188 lb (85.3 kg), SpO2 98 %. ?Body mass index is 36.72 kg/m?. ? ?General: Cooperative, alert, well developed, in no acute distress. ?HEENT: Conjunctivae  and lids unremarkable. ?Cardiovascular: Regular rhythm.  ?Lungs: Normal work of breathing. ?Neurologic: No focal deficits.  ? ?Lab Results  ?Component Value Date  ? CREATININE 0.90 02/19/2021  ? BUN 15 02/19/2021  ? NA 139 02/19/2021  ? K 4.5 02/19/2021  ? CL 98 02/19/2021  ? CO2 26 02/19/2021  ? ?Lab Results  ?Component Value Date  ? ALT 11 02/19/2021  ? AST 19 02/19/2021  ? ALKPHOS 100 02/19/2021  ? BILITOT 0.3 02/19/2021  ? ?Lab Results  ?Component Value Date  ? HGBA1C 6.0 (H) 02/19/2021  ? HGBA1C 6.5 11/13/2020  ? HGBA1C 6.2 04/26/2020  ? HGBA1C 6.1 03/13/2007  ? ?Lab Results  ?Component Value Date  ? INSULIN 9.9 05/02/2020  ? ?Lab Results  ?Component Value Date  ? TSH 0.821 05/02/2020  ? ?Lab Results  ?Component Value Date  ? CHOL 171 11/13/2020  ? HDL 52 11/13/2020  ? [place] 100 11/13/2020  ? TRIG 59 04/26/2020  ? ?Lab Results  ?Component Value Date  ? VD25OH 61.6 02/19/2021  ? VD25OH 62.9 10/16/2020  ? VD25OH 20.9 (L) 05/02/2020  ? ?Lab Results  ?Component Value Date  ? WBC 8.0 11/13/2020  ? HGB 12.5 11/13/2020  ? HCT 37 11/13/2020  ? MCV 77.1 (L) 01/14/2016  ? PLT 473 (A) 11/13/2020  ? ?No results found for: IRON, TIBC, FERRITIN ? ?Obesity Behavioral Intervention:  ? ?Approximately 15 minutes were spent on the discussion below. ? ?ASK: ?We discussed the diagnosis of obesity with Sonia Beck today and Adlean agreed to give [place] permission to discuss obesity behavioral modification therapy today. ? ?ASSESS: ?Liliane has the diagnosis of obesity and her BMI today is 36.8. Lew[place] is in the action stage of change.  ? ?ADVISE: ?Grisela was educated on the multiple health risks of obesity as well as the benefit of weight loss to improve her health. She was advised of the need for long term treatment and the importance of lifestyle modifications to improve her current health and to decrease her risk of future health problems. ? ?AGREE: ?Multiple dietary modification options and treatment options were discussed and  [place] agreed to follow the recommendations documented in the above note. ? ?ARRANGE: ?[place] was educated on the importance of frequent visits to treat obesity as outlined per CMS and USPSTF guidelines and agreed to schedule her next follow up appointment today. ? ?Attestation Statements:  ? ?Reviewed by clinician on day of visit: allergies, medications, problem list, medical history, surgical history, family history, social history, and previous encounter notes. ? ? ?I, Trixie Dredge, am acting as transcriptionist for [employer], DO. ? ?I have reviewed the above documentation for accuracy and completeness, and I agree with the above. Marjory Sneddon, D.O. ? ?The [place] was signed into law in 2016 which includes the topic of electronic health records.  This provides immediate access to information in MyChart.  This includes consultation notes, operative notes, office notes, lab results and pathology reports.  If you have any questions about what you read please let us know at your next visit so we can  discuss your concerns and take corrective action if need be.  We are right here with you. ? ? ?

## 2021-07-12 DIAGNOSIS — M25511 Pain in right shoulder: Secondary | ICD-10-CM | POA: Diagnosis not present

## 2021-07-12 DIAGNOSIS — M25561 Pain in right knee: Secondary | ICD-10-CM | POA: Diagnosis not present

## 2021-07-26 DIAGNOSIS — M25561 Pain in right knee: Secondary | ICD-10-CM | POA: Diagnosis not present

## 2021-07-26 DIAGNOSIS — M25511 Pain in right shoulder: Secondary | ICD-10-CM | POA: Diagnosis not present

## 2021-08-02 ENCOUNTER — Ambulatory Visit (INDEPENDENT_AMBULATORY_CARE_PROVIDER_SITE_OTHER): Payer: Medicare PPO | Admitting: Family Medicine

## 2021-08-09 DIAGNOSIS — M25561 Pain in right knee: Secondary | ICD-10-CM | POA: Diagnosis not present

## 2021-08-09 DIAGNOSIS — M25511 Pain in right shoulder: Secondary | ICD-10-CM | POA: Diagnosis not present

## 2021-08-14 ENCOUNTER — Telehealth (INDEPENDENT_AMBULATORY_CARE_PROVIDER_SITE_OTHER): Payer: Medicare PPO | Admitting: Family Medicine

## 2021-08-14 ENCOUNTER — Encounter (INDEPENDENT_AMBULATORY_CARE_PROVIDER_SITE_OTHER): Payer: Self-pay | Admitting: Family Medicine

## 2021-08-14 DIAGNOSIS — E559 Vitamin D deficiency, unspecified: Secondary | ICD-10-CM | POA: Diagnosis not present

## 2021-08-14 DIAGNOSIS — E1165 Type 2 diabetes mellitus with hyperglycemia: Secondary | ICD-10-CM | POA: Diagnosis not present

## 2021-08-14 DIAGNOSIS — E669 Obesity, unspecified: Secondary | ICD-10-CM | POA: Diagnosis not present

## 2021-08-14 DIAGNOSIS — Z6836 Body mass index (BMI) 36.0-36.9, adult: Secondary | ICD-10-CM

## 2021-08-14 MED ORDER — VITAMIN D (ERGOCALCIFEROL) 1.25 MG (50000 UNIT) PO CAPS: 50000.0000 [IU] | ORAL_CAPSULE | ORAL | 0 refills | Status: DC

## 2021-08-17 DIAGNOSIS — M1990 Unspecified osteoarthritis, unspecified site: Secondary | ICD-10-CM | POA: Diagnosis not present

## 2021-08-17 DIAGNOSIS — E785 Hyperlipidemia, unspecified: Secondary | ICD-10-CM | POA: Diagnosis not present

## 2021-08-17 DIAGNOSIS — M7502 Adhesive capsulitis of left shoulder: Secondary | ICD-10-CM | POA: Diagnosis not present

## 2021-08-17 DIAGNOSIS — L659 Nonscarring hair loss, unspecified: Secondary | ICD-10-CM | POA: Diagnosis not present

## 2021-08-17 DIAGNOSIS — H269 Unspecified cataract: Secondary | ICD-10-CM | POA: Diagnosis not present

## 2021-08-17 DIAGNOSIS — M7501 Adhesive capsulitis of right shoulder: Secondary | ICD-10-CM | POA: Diagnosis not present

## 2021-08-17 DIAGNOSIS — I10 Essential (primary) hypertension: Secondary | ICD-10-CM | POA: Diagnosis not present

## 2021-08-17 DIAGNOSIS — R7303 Prediabetes: Secondary | ICD-10-CM | POA: Diagnosis not present

## 2021-08-22 ENCOUNTER — Encounter: Payer: Self-pay | Admitting: Cardiology

## 2021-08-22 ENCOUNTER — Ambulatory Visit: Payer: Medicare PPO | Admitting: Cardiology

## 2021-08-22 VITALS — BP 146/92 | HR 65 | Ht 61.0 in | Wt 195.0 lb

## 2021-08-22 DIAGNOSIS — Z8249 Family history of ischemic heart disease and other diseases of the circulatory system: Secondary | ICD-10-CM | POA: Diagnosis not present

## 2021-08-22 DIAGNOSIS — R011 Cardiac murmur, unspecified: Secondary | ICD-10-CM | POA: Diagnosis not present

## 2021-08-22 DIAGNOSIS — E7800 Pure hypercholesterolemia, unspecified: Secondary | ICD-10-CM | POA: Diagnosis not present

## 2021-08-22 DIAGNOSIS — I10 Essential (primary) hypertension: Secondary | ICD-10-CM

## 2021-08-22 DIAGNOSIS — E7849 Other hyperlipidemia: Secondary | ICD-10-CM | POA: Diagnosis not present

## 2021-08-22 DIAGNOSIS — Z6839 Body mass index (BMI) 39.0-39.9, adult: Secondary | ICD-10-CM | POA: Diagnosis not present

## 2021-08-22 DIAGNOSIS — I350 Nonrheumatic aortic (valve) stenosis: Secondary | ICD-10-CM

## 2021-08-22 DIAGNOSIS — E8881 Metabolic syndrome: Secondary | ICD-10-CM

## 2021-08-22 MED ORDER — OLMESARTAN MEDOXOMIL 40 MG PO TABS: 40.0000 mg | ORAL_TABLET | Freq: Every day | ORAL | 3 refills | Status: DC

## 2021-08-22 MED ORDER — ROSUVASTATIN CALCIUM 5 MG PO TABS: 5.0000 mg | ORAL_TABLET | ORAL | 3 refills | Status: DC

## 2021-08-22 NOTE — Patient Instructions (Addendum)
Medication Instructions:  ?Increase and take Benicar ( Olmesartan) 40 mg  daily ? ? Increase Rosuvastatin 5 mg to twice a week - Wednesday and Sunday  ? ? ?*If you need a refill on your cardiac medications before your next appointment, please call your pharmacy* ? ? ?Lab Work: ?Not needed ? ? ? ?Testing/Procedures: ?Not needed ? ? ?Follow-Up: ?At [facility], you and your health needs are our priority.  As part of our continuing mission to provide you with exceptional heart care, we have created designated Provider Care Teams.  These Care Teams include your primary Cardiologist (physician) and Advanced Practice Providers (APPs -  Physician Assistants and Nurse Practitioners) who all work together to provide you with the care you need, when you need it. ? ?  ? ?Your next appointment:   ?6 month(s) ? ?The format for your next appointment:   ?In Person ? ?Provider:   ?Your physician recommends that you schedule a follow-up appointment in: 6 month visit K. Lawrence DNP and 12 month with  ?Bryan Lemma, MD  ? ? ?Other instructions ?Okay to use to start taking medication like Wegovy or medication like it  ?

## 2021-08-22 NOTE — Progress Notes (Signed)
TeleHealth Visit:  Due to the COVID-19 pandemic, this visit was completed with telemedicine (audio/video) technology to reduce patient and provider exposure as well as to preserve personal protective equipment.   Sonia Beck has verbally consented to this TeleHealth visit. The patient is located at home, the provider is located at the [facility] office. The participants in this visit include the listed provider and patient. The visit was conducted today via Mychart video.  Chief Complaint: OBESITY Sonia Beck is here to discuss her progress with her obesity treatment plan along with follow-up of her obesity related diagnoses. Sonia Beck is on the Category 1 Plan and states she is following her eating plan approximately 20% of the time. Sonia Beck states she is doing Chief of Staff and physical therapy 45-60 minutes 3 times per week.  Today's visit was #: 19 Starting weight: 208 lbs Starting date: 05/02/2020  Interim History: Sonia Beck feels that she does struggle with adherence to meal plan. She gave up chocolate and fast food for Malena Edman and was very adherent to this but did over indulge on Easter. She has a Engineer, drilling coming up tomorrow and Mother's Day brunch in 3 days.  Subjective:   1. Type 2 diabetes mellitus with hyperglycemia, without long-term current use of insulin (HCC) Shadavia's last A1c of 6.0 ( previously 6.5). Good control with eating habit changes.  2. Vitamin D deficiency Sonia Beck is currently taking prescription Vit D. Her last Vit D level at 61.6. Denies any nausea, vomiting or muscle weakness. Notes fatigue.  Assessment/Plan:   1. Type 2 diabetes mellitus with hyperglycemia, without long-term current use of insulin (HCC) We will repeat labs at her next appointment.  2. Vitamin D deficiency We will refill Vit D 50K, IU weekly for 1 month with zero refills.  -Refill Vitamin D, Ergocalciferol, (DRISDOL) 1.25 MG (50000 UNIT) CAPS capsule; Take 1 capsule (50,000  Units total) by mouth every 7 (seven) days. (OV for RF)  Dispense: 6 capsule; Refill: 0  3. Obesity with current BMI of 36.7 Sonia Beck is currently in the action stage of change. As such, her goal is to continue with weight loss efforts. She has agreed to the Category 1 Plan.   Exercise goals: All adults should avoid inactivity. Some physical activity is better than none, and adults who participate in any amount of physical activity gain some health benefits.  Behavioral modification strategies: increasing lean protein intake, meal planning and cooking strategies, keeping healthy foods in the home, celebration eating strategies, and planning for success.  Sonia Beck has agreed to follow-up with our clinic in 3 weeks. She was informed of the importance of frequent follow-up visits to maximize her success with intensive lifestyle modifications for her multiple health conditions.  Objective:   VITALS: Per patient if applicable, see vitals. GENERAL: Alert and in no acute distress. CARDIOPULMONARY: No increased WOB. Speaking in clear sentences.  PSYCH: Pleasant and cooperative. Speech normal rate and rhythm. Affect is appropriate. Insight and judgement are appropriate. Attention is focused, linear, and appropriate.  NEURO: Oriented as arrived to appointment on time with no prompting.   Lab Results  Component Value Date   CREATININE 0.90 02/19/2021   BUN 15 02/19/2021   NA 139 02/19/2021   K 4.5 02/19/2021   CL 98 02/19/2021   CO2 26 02/19/2021   Lab Results  Component Value Date   ALT 11 02/19/2021   AST 19 02/19/2021   ALKPHOS 100 02/19/2021   BILITOT 0.3 02/19/2021   Lab Results  Component  Value Date   HGBA1C 6.0 (H) 02/19/2021   HGBA1C 6.5 11/13/2020   HGBA1C 6.2 04/26/2020   HGBA1C 6.1 03/13/2007   Lab Results  Component Value Date   INSULIN 9.9 05/02/2020   Lab Results  Component Value Date   TSH 0.821 05/02/2020   Lab Results  Component Value Date   CHOL 171  11/13/2020   HDL 52 11/13/2020   LDLCALC 100 11/13/2020   TRIG 59 04/26/2020   Lab Results  Component Value Date   VD25OH 61.6 02/19/2021   VD25OH 62.9 10/16/2020   VD25OH 20.9 (L) 05/02/2020   Lab Results  Component Value Date   WBC 8.0 11/13/2020   HGB 12.5 11/13/2020   HCT 37 11/13/2020   MCV 77.1 (L) 01/14/2016   PLT 473 (A) 11/13/2020   No results found for: IRON, TIBC, FERRITIN  Attestation Statements:   Reviewed by clinician on day of visit: allergies, medications, problem list, medical history, surgical history, family history, social history, and previous encounter notes.  I, Elnora Morrison, RMA am acting as transcriptionist for Coralie Common, MD.  I have reviewed the above documentation for accuracy and completeness, and I agree with the above. - Coralie Common, MD

## 2021-08-22 NOTE — Progress Notes (Signed)
Primary Care Provider: Seward Carol, MD Cardiologist: Glenetta Hew, MD Electrophysiologist: None  Clinic Note: Chief Complaint  Patient presents with   Follow-up    Echo results-murmur heard on exam.   Hypertension    Usually better controlled than here today.   ===================================  ASSESSMENT/PLAN   Problem List Items Addressed This Visit       Cardiology Problems   Essential hypertension - Primary (Chronic)    BP is little high today.  She says it is usually better than it is here but would still like to see improved control.  Plan: Increase Benicar to 40 mg daily.       Relevant Medications   olmesartan (BENICAR) 40 MG tablet   rosuvastatin (CRESTOR) 5 MG tablet   Other Relevant Orders   EKG 12-Lead (Completed)   Mild aortic stenosis by prior echocardiography (Chronic)    Aortic valve gradient was 13 mmHg which barely meets criteria for mild stenosis.  Mostly sclerosis..     Plan: Continue risk factor modification.  Discussed potential symptoms of worsening aortic stenosis. We will check echocardiogram back in January 2026.        Relevant Medications   olmesartan (BENICAR) 40 MG tablet   rosuvastatin (CRESTOR) 5 MG tablet   Hyperlipidemia due to dietary fat intake (Chronic)    She does have family history of AAA more so than CAD.  LDL goal should be less than 100, preferably less than 70.  Most recent LDL was 100 that was last August.  Plan: m we will see if she can increase Crestor to 2 to 3 days a week.  If she can tolerate 2 days a week, I have asked that she increase to 3 days a week.  She should be due for having labs rechecked between now and then, if not we will check it in 80-monthfollow-up.  May need to consider Zetia plus or minus Nexletol.       Relevant Medications   olmesartan (BENICAR) 40 MG tablet   rosuvastatin (CRESTOR) 5 MG tablet     Other   Metabolic syndrome -> at risk for cardiac disease (Chronic)    Family  history of AAA.  Hypertension, hyperlipidemia and obesity  essentially equals metabolic syndrome.       Class 2 severe obesity with serious comorbidity and body mass index (BMI) of 39.0 to 39.9 in adult ([facility] (Chronic)    [place] [facility] [place]  I encouraged her to strongly reconsider starting it again.  I will help her the weight loss.  We will also help with glycemic control and therefore stave off diabetes and potentially treat lipids.       Family history of abdominal aortic aneurysm (AAA) (Chronic)    She will be due for follow-up ultrasound to be ordered at follow-up visit.       Other Visit Diagnoses     Hypercholesterolemia       Relevant Medications   olmesartan (BENICAR) 40 MG tablet   rosuvastatin (CRESTOR) 5 MG tablet   Other Relevant Orders   EKG 12-Lead (Completed)   Cardiac murmur       Relevant Orders   EKG 12-Lead (Completed)       ===================================  HPI:    Sonia Offieldis an obese 76y.o. female with a PMH notable for HTN and HLD who presents today for 621-monthollow-up.  I last saw Sonia Beck in August 2021 she was told working on trying to monitor her diet and exercise.  At been tried different diets.  Was also trying to work on exercise at the [facility].  Has had some ups and downs of weights.  Noted some exertional dyspnea, deconditioning some mild leg swelling.  Joint pain and neck pain.  Dizziness..  Still hypertensive.  Consider the possibility of him switching to a more aggressive ARB.  Was taking Crestor 5 mg once a week. She has been seen on a couple occasions by Jory Sims, NP ([address] [place], [place], from our CV[facility] clinic.; she was last seen on February 23, 2021 in response to complaints of chest pain back in September.  Thought to be more consistent with musculoskeletal.  He was doing Silver sneakers and tai chi as well as  nutrition counseling.  BP level was elevated.  Was following up with [facility] weight loss. TTE ordered for murmur  Recent Hospitalizations: None  Reviewed  CV studies:    The following studies were reviewed today: (if available, images/films reviewed: From Epic Chart or Care Everywhere) TTE 04/25/2021: EF 60 to 65%.  No RWMA.  Mild LVH with GR 1 DD.  Normal RV function/RVP.  Mild aortic stenosis-mean gradient 13 mmHg  Interval History:   Sonia Beck returns today overall stating that she feels pretty well.  She says her blood pressure here today is little high for her.  Usually is better than this at home.  She has been doing a good job with her exercise doing Silver sneakers at least 2 days a week and working on picking up to 3 days a week but she is also doing PT for her shoulder which is limited that.  Once she finishes PT she may switch to a different location for doing Silver Sneakers.  She did not mention whether or not she was still doing the tai chi exercises though..  With the exercise she is doing, she denies any chest pain or pressure with rest or exertion.  No exertional dyspnea.  No PND, orthopnea with trivial edema that is pretty well controlled with Maxide.  She is only taking the rosuvastatin once a week, doing okay with that.  No real myalgias.  CV Review of Symptoms (Summary) Cardiovascular ROS: no chest pain or dyspnea on exertion negative for - edema, irregular heartbeat, orthopnea, palpitations, paroxysmal nocturnal dyspnea, rapid heart rate, shortness of breath, or lightheadedness, dizziness or wooziness, syncope/near syncope or TIA/amaurosis fugax, claudication  REVIEWED OF SYSTEMS   Review of Systems  Constitutional:  Negative for malaise/fatigue and weight loss (She put back on some of the weight that she had lost, had to stop doing Silver sneakers in order to do shoulder rehab.  Is now working to get back into the exercise plan.).  HENT:  Negative  for congestion and nosebleeds.   Eyes:        She has a floater in the right eye that does not bother her affecting her vision, just something that she sees every now and then.  (I recommended that she discuss this with the ophthalmologist)  Respiratory:  Positive for shortness of breath (Only if she overdoes it). Negative for cough.   Cardiovascular:        Per HPI  Genitourinary:  Negative for dysuria and hematuria.  Musculoskeletal:  Positive for joint pain (Right shoulder). Negative for falls and myalgias.  Neurological:  Negative for dizziness, focal  weakness and headaches.  Psychiatric/Behavioral:  Negative for depression and memory loss. The patient is not nervous/anxious and does not have insomnia.    I have reviewed and (if needed) personally updated the patient's problem list, medications, allergies, past medical and surgical history, social and family history.   PAST MEDICAL HISTORY   Past Medical History:  Diagnosis Date   Anxiety    Anxiety    Asthma    Back pain    Bone spur    bilateral shoulders   Cardiac murmur Echo October 2013   Aortic sclerosis, not stenosis   Chronic renal failure syndrome, stage 2 (mild)    Depression    Depression    Eczema    Gout    Heartburn    Hyperlipidemia    Hypertension    Joint pain    Obesity (BMI 30-39.9)    Former weight 224 pounds with BMI of 41 -- 65 pound weight loss in one year   Osteoarthritis    Prediabetes    [facility] spotted fever 2021   SOBOE (shortness of breath on exertion)    Thrombocytopenia (HCC)     PAST SURGICAL HISTORY   Past Surgical History:  Procedure Laterality Date   ABDOMINAL HYSTERECTOMY  1991   COLONOSCOPY  2021   DOPPLER ECHOCARDIOGRAPHY  04/2021   EF 60 to 65%.  No RWMA.  Mild LVH with GR 1 DD.  Normal RV function/RVP.  Mild aortic stenosis-mean gradient 13 mm 3.   NM MYOCAR PERF WALL MOTION  01/30/2001   EF 69% ,LOW RISK,mild ant wall thinning due to breast attenuation    TRANSTHORACIC ECHOCARDIOGRAM  10/2016   mild LVH.  Focal basal.  EF 55 to 60%.  No R WMA.  GR 1 DD.  Mildly elevated PA pressures of 33 mmHg.  No obvious valve lesion to explain murmur    Immunization History  Administered Date(s) Administered   PFIZER(Purple Top)SARS-COV-2 Vaccination 05/23/2019, 06/15/2019, 02/19/2020    MEDICATIONS/ALLERGIES   Current Meds  Medication Sig   acetaminophen (TYLENOL) 500 MG tablet Take 500 mg by mouth as needed. TAKE 2 TABLET AS NEEDED   ALPRAZolam (XANAX) 0.25 MG tablet Take 0.25 mg by mouth 2 (two) times daily as needed for anxiety.    amLODipine (NORVASC) 10 MG tablet Take 10 mg by mouth daily.    calcium-vitamin D (OSCAL WITH D) 500-200 MG-UNIT per tablet Take 1 tablet by mouth daily.   FLUoxetine (PROZAC) 20 MG capsule Take 20 mg by mouth daily.   montelukast (SINGULAIR) 10 MG tablet Take 10 mg by mouth at bedtime.   olmesartan (BENICAR) 40 MG tablet Take 1 tablet (40 mg total) by mouth daily.   pantoprazole (PROTONIX) 40 MG tablet    potassium chloride SA (K-DUR,KLOR-CON) 20 MEQ tablet Take 20 mEq by mouth daily.   triamterene-hydrochlorothiazide (MAXZIDE-25) 37.5-25 MG per tablet Take 1 tablet by mouth daily.   Vitamin D, Ergocalciferol, (DRISDOL) 1.25 MG (50000 UNIT) CAPS capsule Take 1 capsule (50,000 Units total) by mouth every 7 (seven) days. (OV for RF)   [DISCONTINUED] olmesartan (BENICAR) 20 MG tablet TAKE ONE TABLET BY MOUTH DAILY   [DISCONTINUED] rosuvastatin (CRESTOR) 5 MG tablet Take 5 mg by mouth every Sunday.     Allergies  Allergen Reactions   Erythromycin Other (See Comments)    Stomach cramps.   Fosamax [Alendronate] Other (See Comments)    Joint pain.    SOCIAL HISTORY/FAMILY HISTORY   Reviewed in Epic:  Pertinent  findings:  Social History   Tobacco Use   Smoking status: Never   Smokeless tobacco: Never  Vaping Use   Vaping Use: Never used  Substance Use Topics   Alcohol use: No   Drug use: No   Social  History   Social History Narrative   She is a single with one child and adopted grandson who lives with her. She does not drink or smoke.      She is now on the maintenance phase of the [place]. Exercising at least 4 times a week at the door [place] barn off center doing cardio exercises, otherwise walking at home and the other days, and remaining active otherwise.    OBJCTIVE -PE, EKG, labs   Wt Readings from Last 3 Encounters:  08/22/21 195 lb (88.5 kg)  07/05/21 188 lb (85.3 kg)  06/07/21 185 lb (83.9 kg)    Physical Exam: BP (!) 146/92   Pulse 65   Ht '5\' 1"'  (1.549 m)   Wt 195 lb (88.5 kg)   SpO2 98%   BMI 36.84 kg/m  Physical Exam Vitals reviewed.  Constitutional:      Appearance: Normal appearance. She is obese. She is not ill-appearing (Well-nourished, well-groomed.), toxic-appearing or diaphoretic.  HENT:     Head: Normocephalic and atraumatic.  Neck:     Vascular: Normal carotid pulses. No carotid bruit (Radiated AOV murmur) or JVD.  Cardiovascular:     Rate and Rhythm: Normal rate and regular rhythm. No extrasystoles are present.    Chest Wall: PMI is not displaced.     Pulses: Normal pulses.     Heart sounds: S1 normal and S2 normal. Heart sounds are distant. Murmur (1/6 SEM at RUSB-neck) heard.    No friction rub. No gallop.  Pulmonary:     Effort: Pulmonary effort is normal. No respiratory distress.     Breath sounds: Normal breath sounds. No wheezing, rhonchi or rales.  Chest:     Chest wall: No tenderness.  Musculoskeletal:        General: No swelling (Trivial). Normal range of motion.     Cervical back: Normal range of motion and neck supple.  Skin:    General: Skin is warm and dry.  Neurological:     General: No focal deficit present.     Mental Status: She is alert and oriented to person, place, and time.     Gait: Gait normal.  Psychiatric:        Mood and Affect: Mood normal.        Behavior: Behavior normal.        Thought Content: Thought  content normal.        Judgment: Judgment normal.     Adult ECG Report  Rate: 65 ;  Rhythm: normal sinus rhythm and nonspecific ST-T wave changes.  Otherwise normal axis, intervals and durations. ;   Narrative Interpretation: Stable  Recent Labs: Reviewed Lab Results  Component Value Date   CHOL 171 11/13/2020   HDL 52 11/13/2020   LDLCALC 100 11/13/2020   TRIG 59 04/26/2020   Lab Results  Component Value Date   CREATININE 0.90 02/19/2021   BUN 15 02/19/2021   NA 139 02/19/2021   K 4.5 02/19/2021   CL 98 02/19/2021   CO2 26 02/19/2021      Latest Ref Rng & Units 11/13/2020   12:00 AM 04/26/2020   12:00 AM 10/18/2019   12:00 AM  CBC  WBC  8.0   8.0  6.6       Hemoglobin 12.0 - 16.0 12.5   12.5      12.3       Hematocrit 36 - 46 37   37      37       Platelets 150 - 399 473   473      410          This result is from an external source.    Lab Results  Component Value Date   HGBA1C 6.0 (H) 02/19/2021   Lab Results  Component Value Date   TSH 0.821 05/02/2020    ==================================================  COVID-19 Education: The signs and symptoms of COVID-19 were discussed with the patient and how to seek care for testing (follow up with PCP or arrange E-visit).    I spent a total of 28 minutes with the patient spent in direct patient consultation.  Additional time spent with chart review  / charting (studies, outside notes, etc): 18 min Total Time: 47 min  Current medicines are reviewed at length with the patient today.  (+/- concerns) None  This visit occurred during the SARS-CoV-2 public health emergency.  Safety protocols were in place, including screening questions prior to the visit, additional usage of staff PPE, and extensive cleaning of exam room while observing appropriate contact time as indicated for disinfecting solutions.  Notice: This dictation was prepared with Dragon dictation along with smart phrase technology. Any transcriptional  errors that result from this process are unintentional and may not be corrected upon review.  Studies Ordered:   Orders Placed This Encounter  Procedures   EKG 12-Lead   Meds ordered this encounter  Medications   olmesartan (BENICAR) 40 MG tablet    Sig: Take 1 tablet (40 mg total) by mouth daily.    Dispense:  90 tablet    Refill:  3    Dose change , discontinue previous dose   rosuvastatin (CRESTOR) 5 MG tablet    Sig: Take 1 tablet (5 mg total) by mouth 2 (two) times a week. Every Wednesdays and Sundays    Dispense:  28 tablet    Refill:  3    Patient Instructions / Medication Changes & Studies & Tests Ordered   Patient Instructions  Medication Instructions:  Increase and take Benicar ( Olmesartan) 40 mg  daily   Increase Rosuvastatin 5 mg to twice a week - Wednesday and Sunday    *If you need a refill on your cardiac medications before your next appointment, please call your pharmacy*   Lab Work: Not needed    Testing/Procedures: Not needed   Follow-Up: At [facility], you and your health needs are our priority.  As part of our continuing mission to provide you with exceptional heart care, we have created designated Provider Care Teams.  These Care Teams include your primary Cardiologist (physician) and Advanced Practice Providers (APPs -  Physician Assistants and Nurse Practitioners) who all work together to provide you with the care you need, when you need it.     Your next appointment:   6 month(s)  The format for your next appointment:   In Person  Provider:   Your physician recommends that you schedule a follow-up appointment in: 6 month visit K. [place]ugh and 12 month with  Glenetta Hew, MD    Other instructions Okay to use to start taking medication like Wegovy or medication like it      Glenetta Hew, M.D., M.S. Interventional Cardiologist  Pager # [phone] Phone # [phone] [address] [place], [place]  [address]   Thank you for choosing Heart[facility] at [facility]!!

## 2021-08-23 DIAGNOSIS — M25511 Pain in right shoulder: Secondary | ICD-10-CM | POA: Diagnosis not present

## 2021-08-23 DIAGNOSIS — M25561 Pain in right knee: Secondary | ICD-10-CM | POA: Diagnosis not present

## 2021-09-04 ENCOUNTER — Encounter: Payer: Self-pay | Admitting: Cardiology

## 2021-09-04 NOTE — Assessment & Plan Note (Signed)
She was reportedly started on Wegovy by [facility] -> not sure why she stopped.  I encouraged her to strongly reconsider starting it again.  I will help her the weight loss.  We will also help with glycemic control and therefore stave off diabetes and potentially treat lipids.

## 2021-09-04 NOTE — Assessment & Plan Note (Signed)
She will be due for follow-up ultrasound to be ordered at follow-up visit.

## 2021-09-04 NOTE — Assessment & Plan Note (Signed)
She does have family history of AAA more so than CAD.  LDL goal should be less than 100, preferably less than 70.  Most recent LDL was 100 that was last August.  Plan: m we will see if she can increase Crestor to 2 to 3 days a week.  If she can tolerate 2 days a week, I have asked that she increase to 3 days a week.  She should be due for having labs rechecked between now and then, if not we will check it in 67-month follow-up.  May need to consider Zetia plus or minus Nexletol.

## 2021-09-04 NOTE — Assessment & Plan Note (Signed)
Aortic valve gradient was 13 mmHg which barely meets criteria for mild stenosis.  Mostly sclerosis..     Plan: Continue risk factor modification.  Discussed potential symptoms of worsening aortic stenosis.  We will check echocardiogram back in January 2026.

## 2021-09-04 NOTE — Assessment & Plan Note (Signed)
BP is little high today.  She says it is usually better than it is here but would still like to see improved control.  Plan: Increase Benicar to 40 mg daily.

## 2021-09-04 NOTE — Assessment & Plan Note (Addendum)
Family history of AAA.  Hypertension, hyperlipidemia and obesity  essentially equals metabolic syndrome.

## 2021-09-06 ENCOUNTER — Ambulatory Visit (INDEPENDENT_AMBULATORY_CARE_PROVIDER_SITE_OTHER): Payer: Medicare PPO | Admitting: Family Medicine

## 2021-09-06 ENCOUNTER — Encounter (INDEPENDENT_AMBULATORY_CARE_PROVIDER_SITE_OTHER): Payer: Self-pay | Admitting: Family Medicine

## 2021-09-06 VITALS — BP 138/81 | HR 66 | Temp 98.4°F | Ht 61.0 in | Wt 194.0 lb

## 2021-09-06 DIAGNOSIS — E559 Vitamin D deficiency, unspecified: Secondary | ICD-10-CM | POA: Diagnosis not present

## 2021-09-06 DIAGNOSIS — E669 Obesity, unspecified: Secondary | ICD-10-CM

## 2021-09-06 DIAGNOSIS — Z6836 Body mass index (BMI) 36.0-36.9, adult: Secondary | ICD-10-CM | POA: Diagnosis not present

## 2021-09-06 DIAGNOSIS — E1165 Type 2 diabetes mellitus with hyperglycemia: Secondary | ICD-10-CM | POA: Diagnosis not present

## 2021-09-06 DIAGNOSIS — I10 Essential (primary) hypertension: Secondary | ICD-10-CM

## 2021-09-06 MED ORDER — VITAMIN D (ERGOCALCIFEROL) 1.25 MG (50000 UNIT) PO CAPS: 50000.0000 [IU] | ORAL_CAPSULE | ORAL | 0 refills | Status: DC

## 2021-09-07 LAB — VITAMIN D 25 HYDROXY (VIT D DEFICIENCY, FRACTURES): Vit D, 25-Hydroxy: 51.9 ng/mL (ref 30.0–100.0)

## 2021-09-07 LAB — HEMOGLOBIN A1C
Est. average glucose Bld gHb Est-mCnc: 131 mg/dL
Hgb A1c MFr Bld: 6.2 % — ABNORMAL HIGH (ref 4.8–5.6)

## 2021-09-13 NOTE — Progress Notes (Unsigned)
Chief Complaint:   OBESITY Sonia Beck is here to discuss her progress with her obesity treatment plan along with follow-up of her obesity related diagnoses. [name] is on the Category 1 Plan with breakfast options and states she is following her eating plan approximately 0% of the time. [name] states she is doing Geophysicist/field seismologist 45 minutes 2 times per week.  Today's visit was #: 20 Starting weight: 208 lbs Starting date: 05/02/2020 Today's weight: 194 lbs Today's date: 09/06/2021 Total lbs lost to date: 14 Total lbs lost since last in-office visit: +6  Interim History: Pt notes that she just got into the bad habit of eating whatever she wanted. She notes she did it because the tasted good. Pt declines medication help with weight loss.  Subjective:   1. Type 2 diabetes mellitus with hyperglycemia, without long-term current use of insulin (HCC) [name] is not checking her blood sugars at home. Her A1c 9 months ago was 6.5 and 6 months ago, it was 6.0. She denies feelings of lows or highs. Pt was seen recently by Dr. Herbie Baltimore of cardiology, who requested pt go on Ozempic or Mounjaro.  2. Essential hypertension Cardiology recently increased pt's Benicar from 20 mg to 40 mg. Her BP was 146/92 on 08/22/2021 and at goal today.  3. Vitamin D deficiency She is currently taking prescription vitamin D 50,000 IU each week. She denies nausea, vomiting or muscle weakness.  Assessment/Plan:   Orders Placed This Encounter  Procedures   Hemoglobin A1c   VITAMIN D 25 Hydroxy (Vit-D Deficiency, Fractures)    Medications Discontinued During This Encounter  Medication Reason   Vitamin D, Ergocalciferol, (DRISDOL) 1.25 MG (50000 UNIT) CAPS capsule Reorder     Meds ordered this encounter  Medications   Vitamin D, Ergocalciferol, (DRISDOL) 1.25 MG (50000 UNIT) CAPS capsule    Sig: Take 1 capsule (50,000 Units total) by mouth every 7 (seven) days. (OV for RF)    Dispense:  6 capsule     Refill:  0    45 d supply;  OV for RF     1. Type 2 diabetes mellitus with hyperglycemia, without long-term current use of insulin (HCC) Good blood sugar control is important to decrease the likelihood of diabetic complications such as nephropathy, neuropathy, limb loss, blindness, coronary artery disease, and death. Intensive lifestyle modification including diet, exercise and weight loss are the first line of treatment for diabetes.  Pt declines any injectable meds at all, even after risks and benefits of meds was discussed. I recommend GLP-1 over all others. Pt also declines Metformin again, which we have discussed in detail at previous OV's.she still has sweets cravings. Check labs today.  - Hemoglobin A1c  2. Essential hypertension [name] is working on healthy weight loss and exercise to improve blood pressure control. We will watch for signs of hypotension as she continues her lifestyle modifications. BP much better controlled today.  3. Vitamin D deficiency Low Vitamin D level contributes to fatigue and are associated with obesity, breast, and colon cancer. She agrees to continue to take prescription Vitamin D @50 ,000 IU every week and will follow-up for routine testing of Vitamin D, at least 2-3 times per year to avoid over-replacement. Check labs today.  Refill- Vitamin D, Ergocalciferol, (DRISDOL) 1.25 MG (50000 UNIT) CAPS capsule; Take 1 capsule (50,000 Units total) by mouth every 7 (seven) days. (OV for RF)  Dispense: 6 capsule; Refill: 0  - VITAMIN D 25 Hydroxy (Vit-D Deficiency, Fractures)  4.  Obesity with current BMI of 36.8 [name] is currently in the action stage of change. As such, her goal is to continue with weight loss efforts. She has agreed to the Category 1 Plan with breakfast options.   Pt's goal is to stay on meal plan 4 days a week. She declines diabetes meds to help with weight loss.  Exercise goals:  As is but increase to 3 days a week.  Behavioral  modification strategies: decreasing simple carbohydrates, better snacking choices, and avoiding temptations.  [name] has agreed to follow-up with our clinic in 6 weeks. She was informed of the importance of frequent follow-up visits to maximize her success with intensive lifestyle modifications for her multiple health conditions.   [name] was informed we would discuss her lab results at her next visit unless there is a critical issue that needs to be addressed sooner. [name] agreed to keep her next visit at the agreed upon time to discuss these results.  Objective:   Blood pressure 138/81, pulse 66, temperature 98.4 F (36.9 C), height 5\' 1"  (1.549 m), weight 194 lb (88 kg), SpO2 100 %. Body mass index is 36.66 kg/m.  General: Cooperative, alert, well developed, in no acute distress. HEENT: Conjunctivae and lids unremarkable. Cardiovascular: Regular rhythm.  Lungs: Normal work of breathing. Neurologic: No focal deficits.   Lab Results  Component Value Date   CREATININE 0.90 02/19/2021   BUN 15 02/19/2021   NA 139 02/19/2021   K 4.5 02/19/2021   CL 98 02/19/2021   CO2 26 02/19/2021   Lab Results  Component Value Date   ALT 11 02/19/2021   AST 19 02/19/2021   ALKPHOS 100 02/19/2021   BILITOT 0.3 02/19/2021   Lab Results  Component Value Date   HGBA1C 6.2 (H) 09/06/2021   HGBA1C 6.0 (H) 02/19/2021   HGBA1C 6.5 11/13/2020   HGBA1C 6.2 04/26/2020   HGBA1C 6.1 03/13/2007   Lab Results  Component Value Date   INSULIN 9.9 05/02/2020   Lab Results  Component Value Date   TSH 0.821 05/02/2020   Lab Results  Component Value Date   CHOL 171 11/13/2020   HDL 52 11/13/2020   LDLCALC 100 11/13/2020   TRIG 59 04/26/2020   Lab Results  Component Value Date   VD25OH 51.9 09/06/2021   VD25OH 61.6 02/19/2021   VD25OH 62.9 10/16/2020   Lab Results  Component Value Date   WBC 8.0 11/13/2020   HGB 12.5 11/13/2020   HCT 37 11/13/2020   MCV 77.1 (L) 01/14/2016   PLT  473 (A) 11/13/2020   No results found for: "IRON", "TIBC", "FERRITIN"  Obesity Behavioral Intervention:   Approximately 15 minutes were spent on the discussion below.  ASK: We discussed the diagnosis of obesity with [name] today and [name] agreed to give [place] permission to discuss obesity behavioral modification therapy today.  ASSESS: Ixchel has the diagnosis of obesity and her BMI today is 36.8. Rayla is in the action stage of change.   ADVISE: Pricillia was educated on the multiple health risks of obesity as well as the benefit of weight loss to improve her health. She was advised of the need for long term treatment and the importance of lifestyle modifications to improve her current health and to decrease her risk of future health problems.  AGREE: Multiple dietary modification options and treatment options were discussed and Corea agreed to follow the recommendations documented in the above note.  ARRANGE: Shanquetta was educated on the importance of frequent visits  to treat obesity as outlined per CMS and USPSTF guidelines and agreed to schedule her next follow up appointment today.  Attestation Statements:   Reviewed by clinician on day of visit: allergies, medications, problem list, medical history, surgical history, family history, social history, and previous encounter notes.  I, Kathlene November, BS, CMA, am acting as transcriptionist for [employer], DO.  I have reviewed the above documentation for accuracy and completeness, and I agree with the above. Marjory Sneddon, D.O.  The [place] was signed into law in 2016 which includes the topic of electronic health records.  This provides immediate access to information in MyChart.  This includes consultation notes, operative notes, office notes, lab results and pathology reports.  If you have any questions about what you read please let us know at your next visit so we can discuss your concerns and  take corrective action if need be.  We are right here with you.

## 2021-10-18 ENCOUNTER — Ambulatory Visit (INDEPENDENT_AMBULATORY_CARE_PROVIDER_SITE_OTHER): Payer: Medicare PPO | Admitting: Family Medicine

## 2021-10-18 ENCOUNTER — Encounter (INDEPENDENT_AMBULATORY_CARE_PROVIDER_SITE_OTHER): Payer: Self-pay | Admitting: Family Medicine

## 2021-10-18 VITALS — BP 140/84 | HR 65 | Temp 97.9°F | Ht 61.0 in | Wt 196.0 lb

## 2021-10-18 DIAGNOSIS — E1159 Type 2 diabetes mellitus with other circulatory complications: Secondary | ICD-10-CM

## 2021-10-18 DIAGNOSIS — E669 Obesity, unspecified: Secondary | ICD-10-CM

## 2021-10-18 DIAGNOSIS — E119 Type 2 diabetes mellitus without complications: Secondary | ICD-10-CM | POA: Insufficient documentation

## 2021-10-18 DIAGNOSIS — Z7984 Long term (current) use of oral hypoglycemic drugs: Secondary | ICD-10-CM

## 2021-10-18 DIAGNOSIS — Z6837 Body mass index (BMI) 37.0-37.9, adult: Secondary | ICD-10-CM

## 2021-10-18 DIAGNOSIS — E559 Vitamin D deficiency, unspecified: Secondary | ICD-10-CM

## 2021-10-18 MED ORDER — VITAMIN D (ERGOCALCIFEROL) 1.25 MG (50000 UNIT) PO CAPS: 50000.0000 [IU] | ORAL_CAPSULE | ORAL | 1 refills | Status: AC

## 2021-10-22 NOTE — Progress Notes (Signed)
Chief Complaint:   OBESITY Sonia Beck is here to discuss her progress with her obesity treatment plan along with follow-up of her obesity related diagnoses. Sonia Beck is on the Category 1 Plan with breakfast options and states she is following her eating plan approximately 10% of the time. Sonia Beck states she is more active.  Today's visit was #: 21 Starting weight: 208 lbs Starting date: 001/25/2022 Today's weight: 196 lbs Today's date: 10/18/2021 Total lbs lost to date: 12 lbs Total lbs lost since last in-office visit: +2  Interim History: Sonia Beck has gained 11 lbs since 06/07/2021.  She feels that she is not ready for change.  Some days I do not feel like eating or just eat fruits all day.  On other days I crave cake and other unhealthy items.  Sonia Beck wishes to take time off from our program.     Subjective:   1. Vitamin D deficiency She is currently taking prescription vitamin D 50,000 IU each week. She denies nausea, vomiting or muscle weakness.  Last Vitamin D level 51.9.  2. Type 2 diabetes mellitus with other circulatory complication, without long-term current use of insulin (HCC) Medications reviewed. Diabetic ROS: no polyuria or polydipsia, no chest pain, dyspnea or TIA's, no numbness, tingling or pain in extremities Last A1c was 6.2.  Assessment/Plan:  No orders of the defined types were placed in this encounter.   Medications Discontinued During This Encounter  Medication Reason   Vitamin D, Ergocalciferol, (DRISDOL) 1.25 MG (50000 UNIT) CAPS capsule Reorder     Meds ordered this encounter  Medications   Vitamin D, Ergocalciferol, (DRISDOL) 1.25 MG (50000 UNIT) CAPS capsule    Sig: Take 1 capsule (50,000 Units total) by mouth every 7 (seven) days. (OV for RF)    Dispense:  12 capsule    Refill:  1    OV for RF - no exceptions     1. Vitamin D deficiency Low Vitamin D level contributes to fatigue and are associated with obesity, breast, and colon cancer.  She agrees to continue to take prescription Vitamin D @50 ,000 IU every week and will follow-up for routine testing of Vitamin D, at least 2-3 times per year to avoid over-replacement. Labs are at goal.  Refill - Vitamin D, Ergocalciferol, (DRISDOL) 1.25 MG (50000 UNIT) CAPS capsule; Take 1 capsule (50,000 Units total) by mouth every 7 (seven) days. (OV for RF)  Dispense: 12 capsule; Refill: 1  2. Type 2 diabetes mellitus with other circulatory complication, without long-term current use of insulin (HCC) Good blood sugar control is important to decrease the likelihood of diabetic complications such as nephropathy, neuropathy, limb loss, blindness, coronary artery disease, and death. Intensive lifestyle modification including diet, exercise and weight loss are the first line of treatment for diabetes.  Long discussion with Sonia Beck the risks and benefits of GLP's.  Rybelsus oral, and GLP-1's.  She declines these medications again after recommendations along with the cardiologist.   3. Obesity with current BMI of 37 Explained to Sonia Beck our 6 month rule in detail.   Sonia Beck is currently in the action stage of change. As such, her goal is to continue with weight loss efforts. She has agreed to the Category 1 Plan with breakfast options.   Exercise goals: Older adults should follow the adult guidelines. When older adults cannot meet the adult guidelines, they should be as physically active as their abilities and conditions will allow.   Behavioral modification strategies: increasing lean protein intake and decreasing  simple carbohydrates.  Sonia Beck has agreed to follow-up with our clinic in 5 months.  She was informed of the importance of frequent follow-up visits to maximize her success with intensive lifestyle modifications for her multiple health conditions.   Objective:   Blood pressure 140/84, pulse 65, temperature 97.9 F (36.6 C), height 5\' 1"  (1.549 m), weight 196 lb (88.9 kg), SpO2 98  %. Body mass index is 37.03 kg/m.  General: Cooperative, alert, well developed, in no acute distress. HEENT: Conjunctivae and lids unremarkable. Cardiovascular: Regular rhythm.  Lungs: Normal work of breathing. Neurologic: No focal deficits.   Lab Results  Component Value Date   CREATININE 0.90 02/19/2021   BUN 15 02/19/2021   NA 139 02/19/2021   K 4.5 02/19/2021   CL 98 02/19/2021   CO2 26 02/19/2021   Lab Results  Component Value Date   ALT 11 02/19/2021   AST 19 02/19/2021   ALKPHOS 100 02/19/2021   BILITOT 0.3 02/19/2021   Lab Results  Component Value Date   HGBA1C 6.2 (H) 09/06/2021   HGBA1C 6.0 (H) 02/19/2021   HGBA1C 6.5 11/13/2020   HGBA1C 6.2 04/26/2020   HGBA1C 6.1 03/13/2007   Lab Results  Component Value Date   INSULIN 9.9 05/02/2020   Lab Results  Component Value Date   TSH 0.821 05/02/2020   Lab Results  Component Value Date   CHOL 171 11/13/2020   HDL 52 11/13/2020   LDLCALC 100 11/13/2020   TRIG 59 04/26/2020   Lab Results  Component Value Date   VD25OH 51.9 09/06/2021   VD25OH 61.6 02/19/2021   VD25OH 62.9 10/16/2020   Lab Results  Component Value Date   WBC 8.0 11/13/2020   HGB 12.5 11/13/2020   HCT 37 11/13/2020   MCV 77.1 (L) 01/14/2016   PLT 473 (A) 11/13/2020   No results found for: "IRON", "TIBC", "FERRITIN"  Obesity Behavioral Intervention:   Approximately 15 minutes were spent on the discussion below.  ASK: We discussed the diagnosis of obesity with 01/13/2021 today and Sonia Beck agreed to give Sonia Beck permission to discuss obesity behavioral modification therapy today.  ASSESS: Sonia Beck has the diagnosis of obesity and her BMI today is 37.0. Sonia Beck is in the action stage of change.   ADVISE: Sonia Beck was educated on the multiple health risks of obesity as well as the benefit of weight loss to improve her health. She was advised of the need for long term treatment and the importance of lifestyle modifications to improve  her current health and to decrease her risk of future health problems.  AGREE: Multiple dietary modification options and treatment options were discussed and Sonia Beck agreed to follow the recommendations documented in the above note.  ARRANGE: Sonia Beck was educated on the importance of frequent visits to treat obesity as outlined per CMS and USPSTF guidelines and agreed to schedule her next follow up appointment today.  Attestation Statements:   Reviewed by clinician on day of visit: allergies, medications, problem list, medical history, surgical history, family history, social history, and previous encounter notes.  I, Sonia Beck, RMA, am acting as [place] [employer] for Energy manager, DO.  I have reviewed the above documentation for accuracy and completeness, and I agree with the above. [employer], D.O.  The 21st Century Cures Act was signed into law in 2016 which includes the topic of electronic health records.  This provides immediate access to information in MyChart.  This includes consultation notes, operative notes, office notes, lab results and pathology reports.  If you have any questions about what you read please let us know at your next visit so we can discuss your concerns and take corrective action if need be.  We are right here with you.

## 2021-11-06 DIAGNOSIS — E1169 Type 2 diabetes mellitus with other specified complication: Secondary | ICD-10-CM | POA: Diagnosis not present

## 2021-11-06 DIAGNOSIS — I350 Nonrheumatic aortic (valve) stenosis: Secondary | ICD-10-CM | POA: Diagnosis not present

## 2021-11-06 DIAGNOSIS — E7800 Pure hypercholesterolemia, unspecified: Secondary | ICD-10-CM | POA: Diagnosis not present

## 2021-11-06 DIAGNOSIS — Z0000 Encounter for general adult medical examination without abnormal findings: Secondary | ICD-10-CM | POA: Diagnosis not present

## 2021-11-06 DIAGNOSIS — R7989 Other specified abnormal findings of blood chemistry: Secondary | ICD-10-CM | POA: Diagnosis not present

## 2021-11-06 DIAGNOSIS — Z1331 Encounter for screening for depression: Secondary | ICD-10-CM | POA: Diagnosis not present

## 2021-11-06 DIAGNOSIS — E2839 Other primary ovarian failure: Secondary | ICD-10-CM | POA: Diagnosis not present

## 2021-11-06 DIAGNOSIS — Z5181 Encounter for therapeutic drug level monitoring: Secondary | ICD-10-CM | POA: Diagnosis not present

## 2021-11-06 DIAGNOSIS — I10 Essential (primary) hypertension: Secondary | ICD-10-CM | POA: Diagnosis not present

## 2021-11-06 DIAGNOSIS — F419 Anxiety disorder, unspecified: Secondary | ICD-10-CM | POA: Diagnosis not present

## 2021-11-09 ENCOUNTER — Other Ambulatory Visit: Payer: Self-pay | Admitting: Internal Medicine

## 2021-11-09 DIAGNOSIS — E2839 Other primary ovarian failure: Secondary | ICD-10-CM

## 2021-11-13 ENCOUNTER — Other Ambulatory Visit: Payer: Self-pay | Admitting: Internal Medicine

## 2021-11-13 DIAGNOSIS — Z1231 Encounter for screening mammogram for malignant neoplasm of breast: Secondary | ICD-10-CM

## 2021-11-14 ENCOUNTER — Encounter (INDEPENDENT_AMBULATORY_CARE_PROVIDER_SITE_OTHER): Payer: Self-pay

## 2021-11-20 DIAGNOSIS — H40013 Open angle with borderline findings, low risk, bilateral: Secondary | ICD-10-CM | POA: Diagnosis not present

## 2021-12-04 DIAGNOSIS — R7989 Other specified abnormal findings of blood chemistry: Secondary | ICD-10-CM | POA: Diagnosis not present

## 2021-12-20 ENCOUNTER — Other Ambulatory Visit: Payer: Medicare PPO

## 2022-01-01 ENCOUNTER — Ambulatory Visit
Admission: RE | Admit: 2022-01-01 | Discharge: 2022-01-01 | Disposition: A | Discharge status: Home / Self Care | Payer: Medicare PPO | Source: Ambulatory Visit | Attending: Internal Medicine | Admitting: Internal Medicine

## 2022-01-01 DIAGNOSIS — M8589 Other specified disorders of bone density and structure, multiple sites: Secondary | ICD-10-CM | POA: Diagnosis not present

## 2022-01-01 DIAGNOSIS — Z780 Asymptomatic menopausal state: Secondary | ICD-10-CM | POA: Diagnosis not present

## 2022-01-01 DIAGNOSIS — E2839 Other primary ovarian failure: Secondary | ICD-10-CM

## 2022-01-19 DIAGNOSIS — Z23 Encounter for immunization: Secondary | ICD-10-CM | POA: Diagnosis not present

## 2022-01-30 ENCOUNTER — Ambulatory Visit: Payer: Self-pay

## 2022-01-30 NOTE — Patient Outreach (Signed)
  Care Coordination   01/30/2022 Name: Sonia Beck MRN: [id number] DOB: November 21, 1945   Care Coordination Outreach Attempts:  An unsuccessful telephone outreach was attempted today to offer the patient information about available care coordination services as a benefit of their health plan.   Follow Up Plan:  Additional outreach attempts will be made to offer the patient care coordination information and services.   Encounter Outcome:  No Answer  Care Coordination Interventions Activated:  No   Care Coordination Interventions:  No, not indicated    Daneen Schick, BSW, CDP Social Worker, Certified Dementia Practitioner [facility] Care Management  Care Coordination [phone]

## 2022-02-05 DIAGNOSIS — I10 Essential (primary) hypertension: Secondary | ICD-10-CM | POA: Diagnosis not present

## 2022-02-05 DIAGNOSIS — J329 Chronic sinusitis, unspecified: Secondary | ICD-10-CM | POA: Diagnosis not present

## 2022-03-11 ENCOUNTER — Ambulatory Visit
Admission: RE | Admit: 2022-03-11 | Discharge: 2022-03-11 | Disposition: A | Discharge status: Home / Self Care | Payer: Medicare PPO | Source: Ambulatory Visit | Attending: Internal Medicine | Admitting: Internal Medicine

## 2022-03-11 ENCOUNTER — Ambulatory Visit: Payer: Medicare PPO

## 2022-03-11 DIAGNOSIS — Z1231 Encounter for screening mammogram for malignant neoplasm of breast: Secondary | ICD-10-CM | POA: Diagnosis not present

## 2022-03-20 ENCOUNTER — Ambulatory Visit (INDEPENDENT_AMBULATORY_CARE_PROVIDER_SITE_OTHER): Payer: Medicare PPO | Admitting: Family Medicine

## 2022-04-18 DIAGNOSIS — Z03818 Encounter for observation for suspected exposure to other biological agents ruled out: Secondary | ICD-10-CM | POA: Diagnosis not present

## 2022-04-18 DIAGNOSIS — R0981 Nasal congestion: Secondary | ICD-10-CM | POA: Diagnosis not present

## 2022-05-08 IMAGING — MG MM DIGITAL SCREENING BILAT W/ TOMO AND CAD
8 series · 8 of 24 positions shown · non-contrast
Comparison: Previous exam(s).

CLINICAL DATA: Screening.

EXAM:
DIGITAL SCREENING BILATERAL MAMMOGRAM WITH TOMOSYNTHESIS AND CAD
TECHNIQUE: Bilateral screening digital craniocaudal and mediolateral oblique
mammograms were obtained. Bilateral screening digital breast
tomosynthesis was performed. The images were evaluated with
computer-aided detection.

[R CC synth-2D]
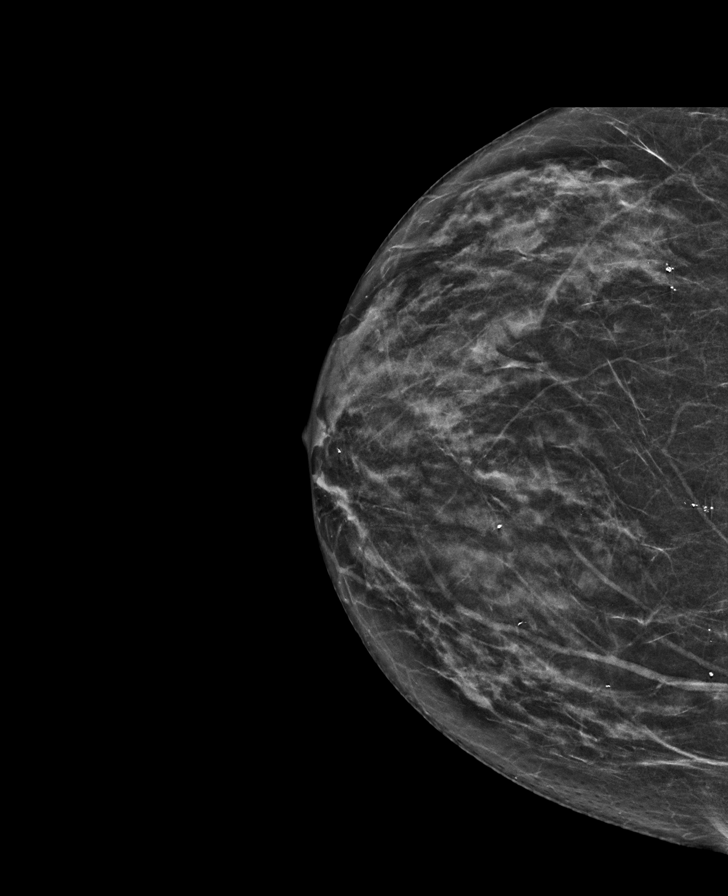

[R MLO synth-2D]
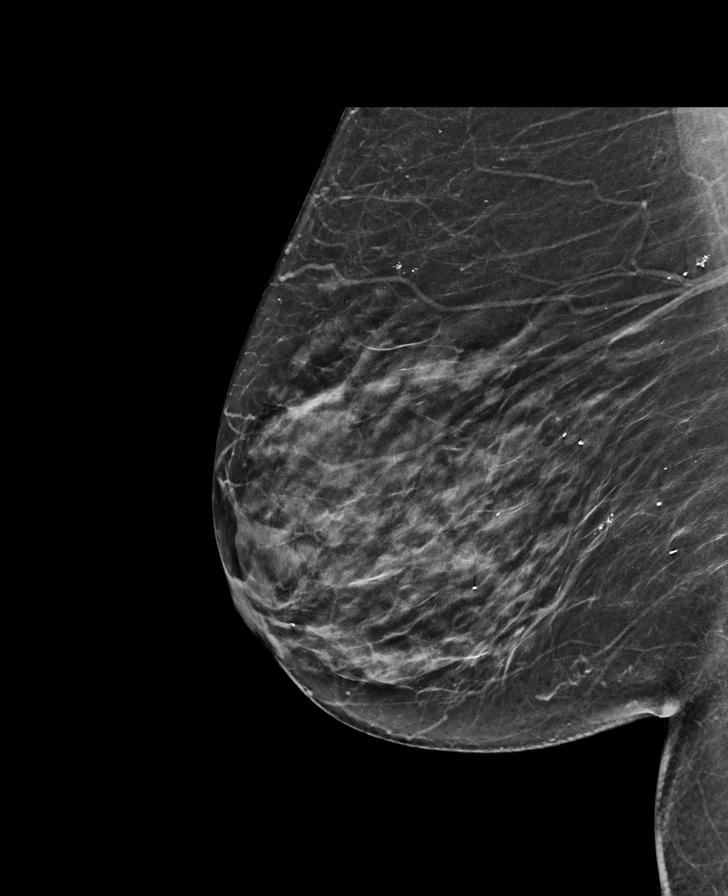

[L MLO synth-2D]
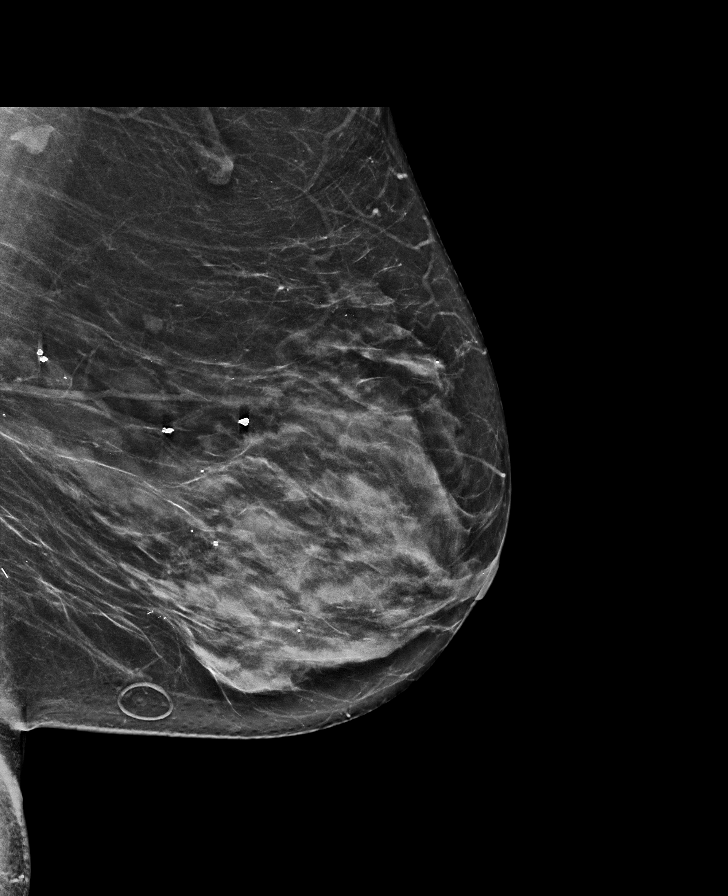

[L CC synth-2D]
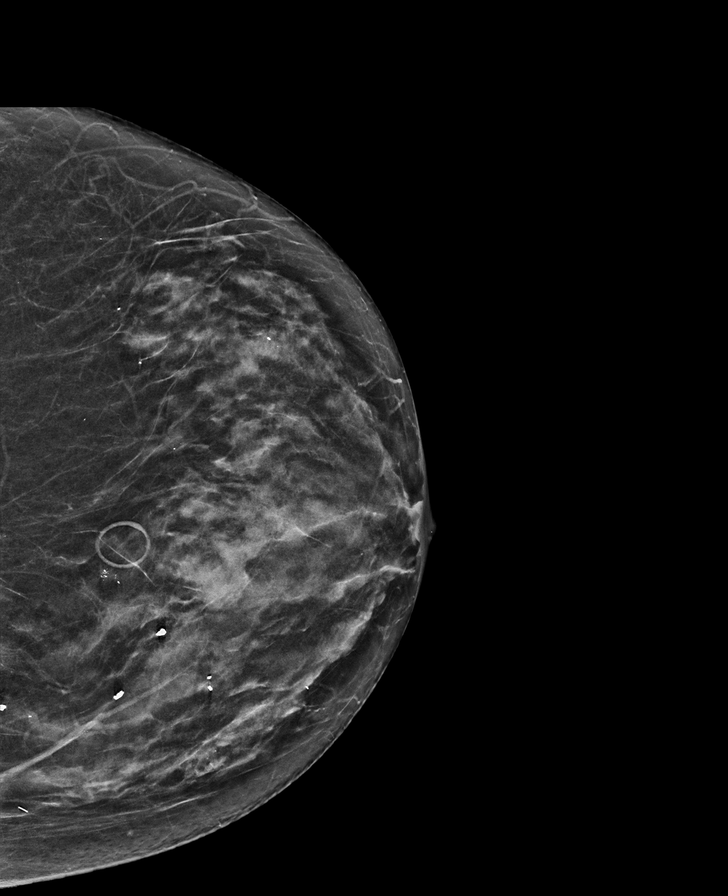

[R MLO tomo · tomo slice 33/66.0]
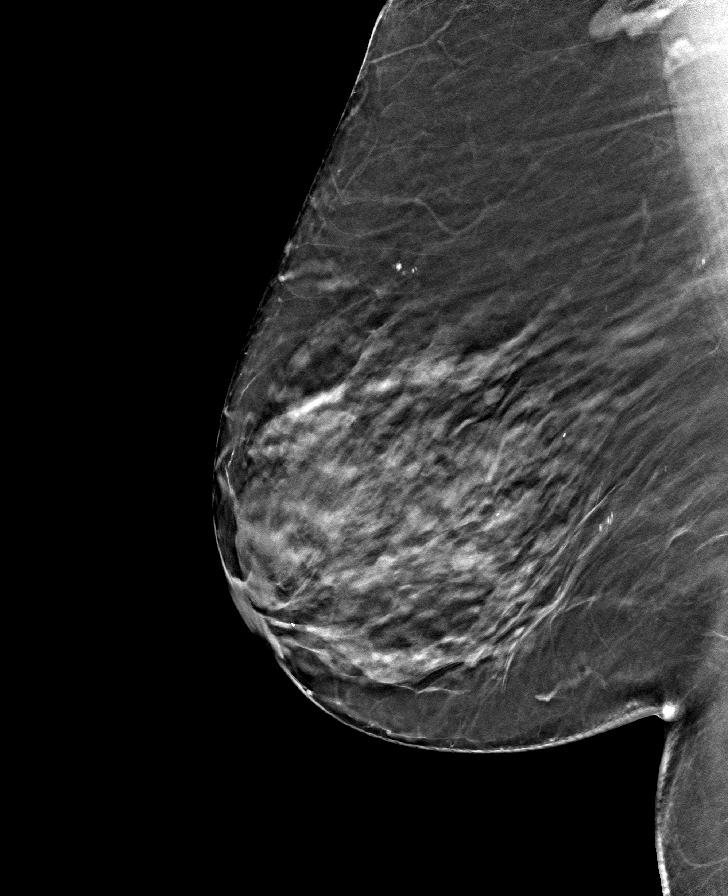

[L CC tomo · tomo slice 29/57.0]
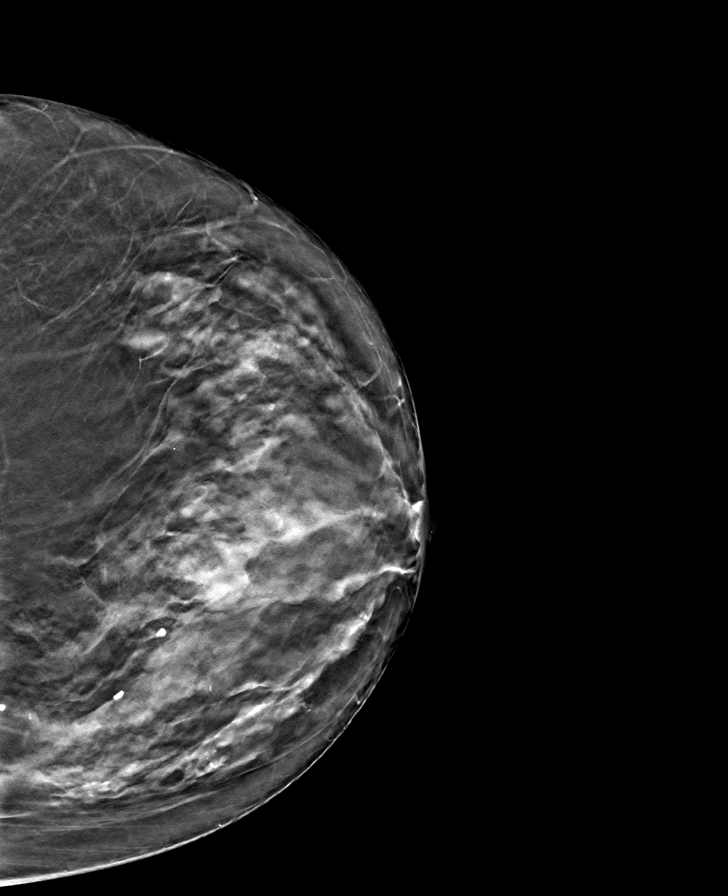

[L MLO tomo · tomo slice 35/69.0]
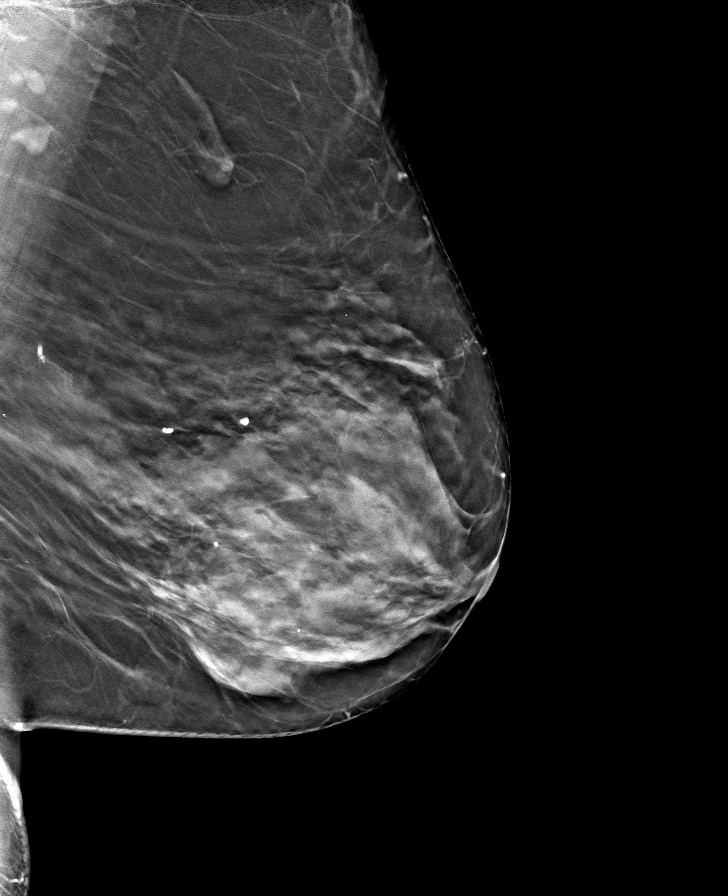

[R CC tomo · tomo slice 26/51.0]
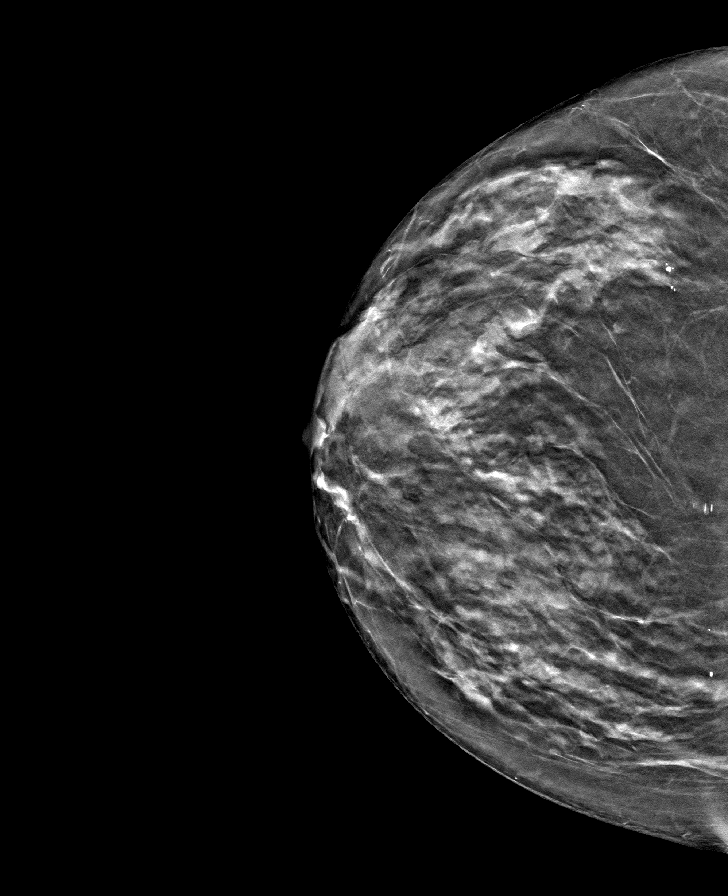

[8 of 24 positions shown; findings below may reference images not displayed]

ACR Breast Density Category c: The breast tissue is heterogeneously
dense, which may obscure small masses.
FINDINGS: There are no findings suspicious for malignancy.
IMPRESSION: No mammographic evidence of malignancy. A result letter of this
screening mammogram will be mailed directly to the patient.

RECOMMENDATION:
Screening mammogram in one year. (Code:[id number]-W-BC3)

BI-RADS CATEGORY  1: Negative.

## 2022-05-14 DIAGNOSIS — I10 Essential (primary) hypertension: Secondary | ICD-10-CM | POA: Diagnosis not present

## 2022-05-14 DIAGNOSIS — M8588 Other specified disorders of bone density and structure, other site: Secondary | ICD-10-CM | POA: Diagnosis not present

## 2022-05-14 DIAGNOSIS — E559 Vitamin D deficiency, unspecified: Secondary | ICD-10-CM | POA: Diagnosis not present

## 2022-05-14 DIAGNOSIS — D509 Iron deficiency anemia, unspecified: Secondary | ICD-10-CM | POA: Diagnosis not present

## 2022-05-14 DIAGNOSIS — N1831 Chronic kidney disease, stage 3a: Secondary | ICD-10-CM | POA: Diagnosis not present

## 2022-05-14 DIAGNOSIS — D649 Anemia, unspecified: Secondary | ICD-10-CM | POA: Diagnosis not present

## 2022-05-14 DIAGNOSIS — E0822 Diabetes mellitus due to underlying condition with diabetic chronic kidney disease: Secondary | ICD-10-CM | POA: Diagnosis not present

## 2022-05-14 DIAGNOSIS — I350 Nonrheumatic aortic (valve) stenosis: Secondary | ICD-10-CM | POA: Diagnosis not present

## 2022-05-14 DIAGNOSIS — M25569 Pain in unspecified knee: Secondary | ICD-10-CM | POA: Diagnosis not present

## 2022-05-14 DIAGNOSIS — E7800 Pure hypercholesterolemia, unspecified: Secondary | ICD-10-CM | POA: Diagnosis not present

## 2022-05-14 DIAGNOSIS — E1169 Type 2 diabetes mellitus with other specified complication: Secondary | ICD-10-CM | POA: Diagnosis not present

## 2022-05-31 DIAGNOSIS — H11152 Pinguecula, left eye: Secondary | ICD-10-CM | POA: Diagnosis not present

## 2022-05-31 DIAGNOSIS — H524 Presbyopia: Secondary | ICD-10-CM | POA: Diagnosis not present

## 2022-05-31 DIAGNOSIS — H35361 Drusen (degenerative) of macula, right eye: Secondary | ICD-10-CM | POA: Diagnosis not present

## 2022-05-31 DIAGNOSIS — H40013 Open angle with borderline findings, low risk, bilateral: Secondary | ICD-10-CM | POA: Diagnosis not present

## 2022-05-31 DIAGNOSIS — H25813 Combined forms of age-related cataract, bilateral: Secondary | ICD-10-CM | POA: Diagnosis not present

## 2022-06-10 DIAGNOSIS — D649 Anemia, unspecified: Secondary | ICD-10-CM | POA: Diagnosis not present

## 2022-06-24 DIAGNOSIS — D509 Iron deficiency anemia, unspecified: Secondary | ICD-10-CM | POA: Diagnosis not present

## 2022-07-09 DIAGNOSIS — E559 Vitamin D deficiency, unspecified: Secondary | ICD-10-CM | POA: Diagnosis not present

## 2022-08-01 ENCOUNTER — Telehealth: Payer: Self-pay

## 2022-08-01 ENCOUNTER — Ambulatory Visit: Payer: Self-pay

## 2022-08-01 NOTE — Patient Outreach (Signed)
  Care Coordination   08/01/2022 Name: Sonia Beck MRN: [id number] DOB: 26-Nov-1945   Care Coordination Outreach Attempts:  An unsuccessful telephone outreach was attempted today to offer the patient information about available care coordination services as a benefit of their health plan.   Follow Up Plan:  Additional outreach attempts will be made to offer the patient care coordination information and services.   Encounter Outcome:  No Answer   Care Coordination Interventions:  No, not indicated    Bevelyn Ngo, BSW, CDP Social Worker, Certified Dementia Practitioner [facility] Care Management  Care Coordination [phone]

## 2022-08-01 NOTE — Patient Instructions (Signed)
Visit Information  Thank you for taking time to visit with me today. Please don't hesitate to contact me if I can be of assistance to you.   Following are the goals we discussed today:  -Follow up with Dr. Nehemiah Settle regarding desired referral  If you are experiencing a Mental Health or Behavioral Health Crisis or need someone to talk to, please go to [facility] [address] [place] [phone])  Patient verbalizes understanding of instructions and care plan provided today and agrees to view in MyChart. Active MyChart status and patient understanding of how to access instructions and care plan via MyChart confirmed with patient.     No further follow up required: Please contact me as needed  Bevelyn Ngo, BSW, CDP Social Worker, Certified Dementia Practitioner [facility] Care Management  Care Coordination [phone]

## 2022-08-01 NOTE — Patient Outreach (Signed)
  Care Coordination   Initial Visit Note   08/01/2022 Name: Ruhama Lehew MRN: [id number] DOB: [birth year]  Randie Bloodgood is a 77 y.o. year old female who sees Renford Dills, MD for primary care. I spoke with  [name] by phone today.  What matters to the patients health and wellness today?  The patient plans to ask her provider for a referral to an orthopedic specialist    Goals Addressed             This Visit's Progress    COMPLETED: Care Coordination Activities       Care Coordination Interventions: [facility] screening performed - no acute resource challenges identified at this time Determined the patient does not have concerns with medication costs and/or adherence Discussed the patient would like a referral to ortho - she will follow up with Dr. Nehemiah Settle at an upcomming office visit Education provided on the role of the care coordination team - no follow up desired at this time Encouraged the patient to contact her primary care provider as needed         SDOH assessments and interventions completed:  Yes  SDOH Interventions Today    Flowsheet Row Most Recent Value  SDOH Interventions   Food Insecurity Interventions Intervention Not Indicated  Housing Interventions Intervention Not Indicated  Transportation Interventions Intervention Not Indicated        Care Coordination Interventions:  Yes, provided   Interventions Today    Flowsheet Row Most Recent Value  Chronic Disease   Chronic disease during today's visit Diabetes  General Interventions   General Interventions Discussed/Reviewed General Interventions Discussed, Doctor Visits  Doctor Visits Discussed/Reviewed Doctor Visits Reviewed  Education Interventions   Education Provided Provided Education  Provided Verbal Education On Other  [role of care coordination team]        Follow up plan: No further intervention required.   Encounter Outcome:  Pt. Visit Completed   Bevelyn Ngo,  BSW, CDP Social Worker, Certified Dementia Practitioner [facility] Care Management  Care Coordination [phone]

## 2022-08-19 ENCOUNTER — Other Ambulatory Visit: Payer: Self-pay | Admitting: Cardiology

## 2022-09-25 ENCOUNTER — Encounter: Payer: Self-pay | Admitting: Physician Assistant

## 2022-09-25 ENCOUNTER — Ambulatory Visit: Payer: Medicare PPO | Attending: Physician Assistant | Admitting: Physician Assistant

## 2022-09-25 VITALS — BP 160/86 | HR 75 | Ht 61.5 in | Wt 209.6 lb

## 2022-09-25 DIAGNOSIS — I351 Nonrheumatic aortic (valve) insufficiency: Secondary | ICD-10-CM

## 2022-09-25 DIAGNOSIS — E7849 Other hyperlipidemia: Secondary | ICD-10-CM | POA: Diagnosis not present

## 2022-09-25 DIAGNOSIS — R7303 Prediabetes: Secondary | ICD-10-CM | POA: Diagnosis not present

## 2022-09-25 DIAGNOSIS — I350 Nonrheumatic aortic (valve) stenosis: Secondary | ICD-10-CM

## 2022-09-25 MED ORDER — ROSUVASTATIN CALCIUM 5 MG PO TABS: ORAL_TABLET | ORAL | 12 refills | Status: DC

## 2022-09-25 MED ORDER — OLMESARTAN MEDOXOMIL 40 MG PO TABS: 40.0000 mg | ORAL_TABLET | Freq: Every day | ORAL | 0 refills | Status: DC

## 2022-09-25 NOTE — Progress Notes (Signed)
Cardiology Office Note:  .   Date:  09/25/2022  ID:  Sonia Beck, DOB 1946-03-19, MRN [id number] PCP: Renford Dills, MD  [facility] Providers Cardiologist:  Sonia Lemma, MD    History of Present Illness: [name] Kitchen   Sonia Beck is a 77 y.o. female with past medical history of hypertension, hyperlipidemia, mild aortic stenosis, family history of AAA and obesity.  CT of the abdomen obtained on 10/08/2016 showed no evidence of abdominal aortic aneurysm.  Echocardiogram obtained 04/25/2021 showed EF 60 to 65%, grade 1 DD, RVSP 33 mmHg, trivial MR, mild aortic stenosis.  There was no mention of any dilated aortic root either. Patient was last seen by Dr. Herbie Baltimore in May 2023 at which time her blood pressure was elevated, olmesartan was increased to 40 mg daily.  Crestor was increased to 5 mg twice a week on Wednesday and Sunday.  6-month follow-up was recommended.   Patient presents today for follow-up.  Her blood pressure remains elevated today.  Her last dose of olmesartan was on Sunday, she has ran out of her prescription.  She will need a refill of olmesartan and Crestor.  Blood pressure is understandably high as she has not taking the olmesartan.  She also did not take amlodipine this morning.  She did take her triamterene-HCTZ.  Blood work obtained by PCP in February showed a stable renal function and electrolyte.  Fasting lipid panel obtained in February showed LDL of 102, however well-controlled HDL, triglyceride and total cholesterol.  Her LDL goal should be less than 100.  I asked her why she is taking rosuvastatin twice a week rather than more frequent dose, she preferred to keep it at twice a week dosing.  She denies any exertional symptoms such as chest pain or worsening dyspnea.  She occasionally has some dizziness when she is laying on her right side but this is quite transient and quickly resolves.  She has right knee arthritis and is going to see orthopedic surgery service.   Overall, she is doing quite well from the cardiac perspective and can follow-up in 6 to 8 months with Dr. Harding.  EKG today showed no acute changes.  ROS:  She denies chest pain, palpitations, dyspnea, pnd, orthopnea, n, v, syncope, edema, weight gain, or early satiety. All other systems reviewed and are otherwise negative except as noted above.   She does have dizziness when laying on the right side, however this appears to be transient.  She has right knee pain.  Studies Reviewed: .   EKG Interpretation  Date/Time:  Wednesday September 25 2022 08:10:42 EDT Ventricular Rate:  75 PR Interval:  166 QRS Duration: 90 QT Interval:  378 QTC Calculation: 422 R Axis:   -1 Text Interpretation: Normal sinus rhythm Normal ECG No significant ST-T wave changes When compared with ECG of Oct 2017, no significant change. Confirmed by [name] ([id number]) on 09/25/2022 8:21:46 AM    Cardiac Studies & Procedures     STRESS TESTS  NM MYOCAR MULTI W/SPECT W 11/09/2012   ECHOCARDIOGRAM  ECHOCARDIOGRAM COMPLETE 04/25/2021  Narrative ECHOCARDIOGRAM REPORT    Patient Name:   Sonia Beck Date of Exam: 04/25/2021 Medical Rec #:  [id number]          Height:       60.0 in Accession #:    [id number]         Weight:       19 1.0 lb Date of Birth:  09-May-1945  BSA:          1.830 m Patient Age:    75 years           BP:           175/112 mmHg Patient Gender: F                  HR:           63 bpm. Exam Location:  [address]  Procedure: 2D Echo, 3D Echo, Cardiac Doppler and Color Doppler  Indications:    R01.1 Murmur  History:        Patient has prior history of Echocardiogram examinations, most recent 10/11/2016. Signs/Symptoms:Shortness of Breath; Risk Factors:Hypertension and HLD.  Sonographer:    Clearence Ped RCS Referring Phys: [id number] Bettey Mare LAWRENCE  IMPRESSIONS   1. Left ventricular ejection fraction, by estimation, is 60 to 65%. The left ventricle has normal function. The left  ventricle has no regional wall motion abnormalities. There is mild left ventricular hypertrophy. Left ventricular diastolic parameters are consistent with Grade I diastolic dysfunction (impaired relaxation). 2. Right ventricular systolic function is normal. The right ventricular size is normal. There is normal pulmonary artery systolic pressure. The estimated right ventricular systolic pressure is 33.0 mmHg. 3. The mitral valve is normal in structure. Trivial mitral valve regurgitation. No evidence of mitral stenosis. 4. The aortic valve is tricuspid. Aortic valve regurgitation is not visualized. Mild aortic valve stenosis. Aortic valve mean gradient measures 13.0 mmHg. 5. The inferior vena cava is normal in size with greater than 50% respiratory variability, suggesting right atrial pressure of 3 mmHg.  FINDINGS Left Ventricle: Left ventricular ejection fraction, by estimation, is 60 to 65%. The left ventricle has normal function. The left ventricle has no regional wall motion abnormalities. The left ventricular internal cavity size was normal in size. There is mild left ventricular hypertrophy. Left ventricular diastolic parameters are consistent with Grade I diastolic dysfunction (impaired relaxation).  Right Ventricle: The right ventricular size is normal. No increase in right ventricular wall thickness. Right ventricular systolic function is normal. There is normal pulmonary artery systolic pressure. The tricuspid regurgitant velocity is 2.74 m/s, and with an assumed right atrial pressure of 3 mmHg, the estimated right ventricular systolic pressure is 33.0 mmHg.  Left Atrium: Left atrial size was normal in size.  Right Atrium: Right atrial size was normal in size.  Pericardium: There is no evidence of pericardial effusion.  Mitral Valve: The mitral valve is normal in structure. Trivial mitral valve regurgitation. No evidence of mitral valve stenosis.  Tricuspid Valve: The tricuspid valve is  normal in structure. Tricuspid valve regurgitation is trivial.  Aortic Valve: The aortic valve is tricuspid. Aortic valve regurgitation is not visualized. Mild aortic stenosis is present. Aortic valve mean gradient measures 13.0 mmHg. Aortic valve peak gradient measures 28.7 mmHg. Aortic valve area, by VTI measures 1.04 cm.  Pulmonic Valve: The pulmonic valve was normal in structure. Pulmonic valve regurgitation is not visualized.  Aorta: The aortic root is normal in size and structure.  Venous: The inferior vena cava is normal in size with greater than 50% respiratory variability, suggesting right atrial pressure of 3 mmHg.  IAS/Shunts: No atrial level shunt detected by color flow Doppler.   LEFT VENTRICLE PLAX 2D LVIDd:         3.50 cm   Diastology LVIDs:         2.50 cm   LV e' medial:    8.59 cm/s  LV PW:         1.00 cm   LV E/e' medial:  10.6 LV IVS:        1.00 cm   LV e' lateral:   12.30 cm/s LVOT diam:     1.70 cm   LV E/e' lateral: 7.4 LV SV:         64 LV SV Index:   35 LVOT Area:     2.27 cm  3D Volume EF: 3D EF:        64 % LV EDV:       87 ml LV ESV:       31 ml LV SV:        56 ml  RIGHT VENTRICLE RV Basal diam:  2.80 cm RV S prime:     11.50 cm/s TAPSE (M-mode): 2.4 cm RVSP:           33.0 mmHg  LEFT ATRIUM             Index        RIGHT ATRIUM           Index LA diam:        3.50 cm 1.91 cm/m   RA Pressure: 3.00 mmHg LA Vol (A2C):   51.2 ml 27.97 ml/m  RA Area:     9.49 cm LA Vol (A4C):   31.4 ml 17.15 ml/m  RA Volume:   18.60 ml  10.16 ml/m LA Biplane Vol: 40.2 ml 21.96 ml/m AORTIC VALVE AV Area (Vmax):    0.99 cm AV Area (Vmean):   1.05 cm AV Area (VTI):     1.04 cm AV Vmax:           268.00 cm/s AV Vmean:          167.000 cm/s AV VTI:            0.616 m AV Peak Grad:      28.7 mmHg AV Mean Grad:      13.0 mmHg LVOT Vmax:         117.00 cm/s LVOT Vmean:        77.000 cm/s LVOT VTI:          0.281 m LVOT/AV VTI ratio: 0.46  AORTA Ao  Root diam: 2.70 cm Ao Asc diam:  3.30 cm  MITRAL VALVE                TRICUSPID VALVE MV Area (PHT):              TR Peak grad:   30.0 mmHg MV Decel Time:              TR Vmax:        274.00 cm/s MV E velocity: 91.20 cm/s   Estimated RAP:  3.00 mmHg MV A velocity: 106.00 cm/s  RVSP:           33.0 mmHg MV E/A ratio:  0.86 SHUNTS Systemic VTI:  0.28 m Systemic Diam: 1.70 cm  Dalton McleanMD Electronically signed by Wilfred Lacy Signature Date/Time: 04/25/2021/3:34:21 PM    Final             Risk Assessment/Calculations:            Physical Exam:   VS:  BP (!) 160/86 (BP Location: Left Arm, Patient Position: Sitting, Cuff Size: Large)   Pulse 75   Ht 5' 1.5" (1.562 m)   Wt 209 lb 9.6 oz (95.1 kg)   SpO2 98%  BMI 38.96 kg/m    Wt Readings from Last 3 Encounters:  09/25/22 209 lb 9.6 oz (95.1 kg)  10/18/21 196 lb (88.9 kg)  09/06/21 194 lb (88 kg)    GEN: Well nourished, well developed in no acute distress NECK: No JVD; No carotid bruits CARDIAC: RRR, no rubs, gallops. 3/6 murmur RESPIRATORY:  Clear to auscultation without rales, wheezing or rhonchi  ABDOMEN: Soft, non-tender, non-distended EXTREMITIES:  No edema; No deformity   ASSESSMENT AND PLAN: .   Hypertension: Blood pressure elevated today as she has ran out of olmesartan for the past 2 days.  She also did not take her amlodipine this morning says she usually take it later in the morning.  Will refill medication and reassess in 6 months.  Hyperlipidemia: On Crestor 5 mg twice a week.  She wished to keep at the current schedule.  Last lipid panel obtained in February showed LDL 102, otherwise well-controlled the total cholesterol, triglyceride and HDL.  Prediabetes: Most recent hemoglobin A1c was 6.3 in February.  Followed by primary care provider.  Aortic regurgitation: Moderate aortic regurgitation on echocardiogram in January 2023.  She continues to have 3 out of 6 heart murmur.       Dispo: Dr. Edwyna Shell  in 6 to 8 months.  Signed, Azalee Course, PA

## 2022-09-25 NOTE — Patient Instructions (Signed)
Medication Instructions:  The current medical regimen is effective;  continue present plan and medications as directed. Please refer to the Current Medication list given to you today.  *If you need a refill on your cardiac medications before your next appointment, please call your pharmacy*  Lab Work: NONE ordered at this time of appointment   If you have labs (blood work) drawn today and your tests are completely normal, you will receive your results only by:   MyChart Message (if you have MyChart) OR  A paper copy in the mail If you have any lab test that is abnormal or we need to change your treatment, we will call you to review the results.  Testing/Procedures: NONE ordered at this time of appointment    Follow-Up: At [facility], you and your health needs are our priority.  As part of our continuing mission to provide you with exceptional heart care, we have created designated Provider Care Teams.  These Care Teams include your primary Cardiologist (physician) and Advanced Practice Providers (APPs -  Physician Assistants and Nurse Practitioners) who all work together to provide you with the care you need, when you need it.  Your next appointment:   6 month(s)  Provider:   David Harding, MD     Other Instructions  

## 2022-09-30 ENCOUNTER — Other Ambulatory Visit: Payer: Self-pay

## 2022-09-30 MED ORDER — OLMESARTAN MEDOXOMIL 40 MG PO TABS: 40.0000 mg | ORAL_TABLET | Freq: Every day | ORAL | 3 refills | Status: DC

## 2022-09-30 NOTE — Telephone Encounter (Signed)
Pt's medication was sent to pt's pharmacy as requested. Confirmation received.  °

## 2022-10-01 DIAGNOSIS — M25561 Pain in right knee: Secondary | ICD-10-CM | POA: Insufficient documentation

## 2022-10-07 DIAGNOSIS — M1711 Unilateral primary osteoarthritis, right knee: Secondary | ICD-10-CM | POA: Diagnosis not present

## 2022-10-16 DIAGNOSIS — M25561 Pain in right knee: Secondary | ICD-10-CM | POA: Diagnosis not present

## 2022-10-16 DIAGNOSIS — M1711 Unilateral primary osteoarthritis, right knee: Secondary | ICD-10-CM | POA: Diagnosis not present

## 2022-10-18 DIAGNOSIS — M1711 Unilateral primary osteoarthritis, right knee: Secondary | ICD-10-CM | POA: Diagnosis not present

## 2022-10-18 DIAGNOSIS — M25561 Pain in right knee: Secondary | ICD-10-CM | POA: Diagnosis not present

## 2022-10-19 DIAGNOSIS — M1711 Unilateral primary osteoarthritis, right knee: Secondary | ICD-10-CM | POA: Diagnosis not present

## 2022-10-19 DIAGNOSIS — M25561 Pain in right knee: Secondary | ICD-10-CM | POA: Diagnosis not present

## 2022-10-21 DIAGNOSIS — M25561 Pain in right knee: Secondary | ICD-10-CM | POA: Diagnosis not present

## 2022-10-21 DIAGNOSIS — M1711 Unilateral primary osteoarthritis, right knee: Secondary | ICD-10-CM | POA: Diagnosis not present

## 2022-10-23 DIAGNOSIS — M1711 Unilateral primary osteoarthritis, right knee: Secondary | ICD-10-CM | POA: Diagnosis not present

## 2022-10-23 DIAGNOSIS — M25561 Pain in right knee: Secondary | ICD-10-CM | POA: Diagnosis not present

## 2022-10-25 DIAGNOSIS — M25561 Pain in right knee: Secondary | ICD-10-CM | POA: Diagnosis not present

## 2022-10-25 DIAGNOSIS — M1711 Unilateral primary osteoarthritis, right knee: Secondary | ICD-10-CM | POA: Diagnosis not present

## 2022-10-28 DIAGNOSIS — M25561 Pain in right knee: Secondary | ICD-10-CM | POA: Diagnosis not present

## 2022-10-28 DIAGNOSIS — M1711 Unilateral primary osteoarthritis, right knee: Secondary | ICD-10-CM | POA: Diagnosis not present

## 2022-10-30 DIAGNOSIS — M1711 Unilateral primary osteoarthritis, right knee: Secondary | ICD-10-CM | POA: Diagnosis not present

## 2022-10-30 DIAGNOSIS — M25561 Pain in right knee: Secondary | ICD-10-CM | POA: Diagnosis not present

## 2022-11-02 DIAGNOSIS — M1711 Unilateral primary osteoarthritis, right knee: Secondary | ICD-10-CM | POA: Diagnosis not present

## 2022-11-02 DIAGNOSIS — M25561 Pain in right knee: Secondary | ICD-10-CM | POA: Diagnosis not present

## 2022-11-04 DIAGNOSIS — M25561 Pain in right knee: Secondary | ICD-10-CM | POA: Diagnosis not present

## 2022-11-04 DIAGNOSIS — M1711 Unilateral primary osteoarthritis, right knee: Secondary | ICD-10-CM | POA: Diagnosis not present

## 2022-11-06 DIAGNOSIS — M1711 Unilateral primary osteoarthritis, right knee: Secondary | ICD-10-CM | POA: Diagnosis not present

## 2022-11-06 DIAGNOSIS — M25561 Pain in right knee: Secondary | ICD-10-CM | POA: Diagnosis not present

## 2022-11-08 DIAGNOSIS — M25561 Pain in right knee: Secondary | ICD-10-CM | POA: Diagnosis not present

## 2022-11-08 DIAGNOSIS — M1711 Unilateral primary osteoarthritis, right knee: Secondary | ICD-10-CM | POA: Diagnosis not present

## 2022-11-12 DIAGNOSIS — Z1331 Encounter for screening for depression: Secondary | ICD-10-CM | POA: Diagnosis not present

## 2022-11-12 DIAGNOSIS — I350 Nonrheumatic aortic (valve) stenosis: Secondary | ICD-10-CM | POA: Diagnosis not present

## 2022-11-12 DIAGNOSIS — E1169 Type 2 diabetes mellitus with other specified complication: Secondary | ICD-10-CM | POA: Diagnosis not present

## 2022-11-12 DIAGNOSIS — Z23 Encounter for immunization: Secondary | ICD-10-CM | POA: Diagnosis not present

## 2022-11-12 DIAGNOSIS — F419 Anxiety disorder, unspecified: Secondary | ICD-10-CM | POA: Diagnosis not present

## 2022-11-12 DIAGNOSIS — Z6841 Body Mass Index (BMI) 40.0 and over, adult: Secondary | ICD-10-CM | POA: Diagnosis not present

## 2022-11-12 DIAGNOSIS — Z0000 Encounter for general adult medical examination without abnormal findings: Secondary | ICD-10-CM | POA: Diagnosis not present

## 2022-11-12 DIAGNOSIS — I10 Essential (primary) hypertension: Secondary | ICD-10-CM | POA: Diagnosis not present

## 2022-11-12 DIAGNOSIS — F321 Major depressive disorder, single episode, moderate: Secondary | ICD-10-CM | POA: Diagnosis not present

## 2022-11-12 DIAGNOSIS — M25561 Pain in right knee: Secondary | ICD-10-CM | POA: Diagnosis not present

## 2022-11-12 DIAGNOSIS — E7800 Pure hypercholesterolemia, unspecified: Secondary | ICD-10-CM | POA: Diagnosis not present

## 2022-11-12 DIAGNOSIS — E1122 Type 2 diabetes mellitus with diabetic chronic kidney disease: Secondary | ICD-10-CM | POA: Diagnosis not present

## 2022-11-12 DIAGNOSIS — M1711 Unilateral primary osteoarthritis, right knee: Secondary | ICD-10-CM | POA: Diagnosis not present

## 2022-11-12 DIAGNOSIS — N1831 Chronic kidney disease, stage 3a: Secondary | ICD-10-CM | POA: Diagnosis not present

## 2022-11-18 DIAGNOSIS — M25561 Pain in right knee: Secondary | ICD-10-CM | POA: Diagnosis not present

## 2022-11-18 DIAGNOSIS — M1711 Unilateral primary osteoarthritis, right knee: Secondary | ICD-10-CM | POA: Diagnosis not present

## 2022-11-25 DIAGNOSIS — M1711 Unilateral primary osteoarthritis, right knee: Secondary | ICD-10-CM | POA: Diagnosis not present

## 2022-11-25 DIAGNOSIS — M25561 Pain in right knee: Secondary | ICD-10-CM | POA: Diagnosis not present

## 2022-11-27 ENCOUNTER — Emergency Department (HOSPITAL_COMMUNITY)
Admission: EM | Admit: 2022-11-27 | Discharge: 2022-11-27 | Disposition: A | Discharge status: Home / Self Care | Payer: Medicare PPO | Attending: Emergency Medicine | Admitting: Emergency Medicine

## 2022-11-27 ENCOUNTER — Other Ambulatory Visit: Payer: Self-pay

## 2022-11-27 ENCOUNTER — Encounter (HOSPITAL_COMMUNITY): Payer: Self-pay | Admitting: Emergency Medicine

## 2022-11-27 DIAGNOSIS — I10 Essential (primary) hypertension: Secondary | ICD-10-CM | POA: Diagnosis not present

## 2022-11-27 DIAGNOSIS — R519 Headache, unspecified: Secondary | ICD-10-CM | POA: Diagnosis not present

## 2022-11-27 DIAGNOSIS — R040 Epistaxis: Secondary | ICD-10-CM | POA: Diagnosis not present

## 2022-11-27 MED ORDER — AMOXICILLIN-POT CLAVULANATE 875-125 MG PO TABS: 1.0000 | ORAL_TABLET | Freq: Two times a day (BID) | ORAL | 0 refills | Status: DC

## 2022-11-27 NOTE — ED Notes (Signed)
Patients nose began bleeding again and spitting up blood clots.

## 2022-11-27 NOTE — ED Provider Notes (Signed)
EMERGENCY DEPARTMENT AT [facility] Provider Note   CSN: [id number] Arrival date & time: 11/27/22  1445     History  Chief Complaint  Patient presents with   Epistaxis    Sonia Beck is a 77 y.o. female.  77 yo F with a chief complaints of epistaxis.  She had this occur a couple days ago started noticing some bleeding mostly on the left side.  Stopped after some supportive measures.  She had no issue yesterday and then had recurrence today.  She is also been having frontal headaches and some congestion.  She denies trauma to the nose.  Denies blood thinner use.   Epistaxis      Home Medications Prior to Admission medications   Medication Sig Start Date End Date Taking? Authorizing Provider  amoxicillin-clavulanate (AUGMENTIN) 875-125 MG tablet Take 1 tablet by mouth every 12 (twelve) hours. 11/27/22  Yes Melene Plan, DO  acetaminophen (TYLENOL) 500 MG tablet Take 500 mg by mouth as needed. TAKE 2 TABLET AS NEEDED    [provider]  ALPRAZolam (XANAX) 0.25 MG tablet Take 0.25 mg by mouth 2 (two) times daily as needed for anxiety.     [provider]  amLODipine (NORVASC) 10 MG tablet Take 10 mg by mouth daily.  11/01/14   [provider]  calcium-vitamin D (OSCAL WITH D) 500-200 MG-UNIT per tablet Take 1 tablet by mouth daily.    [provider]  FLUoxetine (PROZAC) 20 MG capsule Take 20 mg by mouth daily.    [provider]  montelukast (SINGULAIR) 10 MG tablet Take 10 mg by mouth at bedtime.    [provider]  olmesartan (BENICAR) 40 MG tablet Take 1 tablet (40 mg total) by mouth daily. 09/30/22   Marykay Lex, MD  pantoprazole (PROTONIX) 40 MG tablet  02/13/20   [provider]  potassium chloride SA (K-DUR,KLOR-CON) 20 MEQ tablet Take 20 mEq by mouth daily.    [provider]  rosuvastatin (CRESTOR) 5 MG tablet Take 1 tablet (5 mg total) by mouth 2 (two) times a week. Every  Wednesday and Sunday 09/25/22   Azalee Course, PA  triamterene-hydrochlorothiazide (MAXZIDE-25) 37.5-25 MG per tablet Take 1 tablet by mouth daily.    [provider]      Allergies    Erythromycin and Fosamax [alendronate]    Review of Systems   Review of Systems  HENT:  Positive for nosebleeds.     Physical Exam Updated Vital Signs BP (!) 141/63 (BP Location: Right Arm)   Pulse (!) 106   Temp 98 F (36.7 C)   Resp 16   Ht 5' 1.5" (1.562 m)   Wt 94.8 kg   SpO2 97%   BMI 38.85 kg/m  Physical Exam Vitals and nursing note reviewed.  Constitutional:      General: She is not in acute distress.    Appearance: She is well-developed. She is not diaphoretic.  HENT:     Head: Normocephalic and atraumatic.     Nose:     Comments: Hypervascularity in Kiesselbach's plexus on the left Eyes:     Pupils: Pupils are equal, round, and reactive to light.  Cardiovascular:     Rate and Rhythm: Normal rate and regular rhythm.     Heart sounds: No murmur heard.    No friction rub. No gallop.  Pulmonary:     Effort: Pulmonary effort is normal.     Breath sounds: No wheezing  or rales.  Abdominal:     General: There is no distension.     Palpations: Abdomen is soft.     Tenderness: There is no abdominal tenderness.  Musculoskeletal:        General: No tenderness.     Cervical back: Normal range of motion and neck supple.  Skin:    General: Skin is warm and dry.  Neurological:     Mental Status: She is alert and oriented to person, place, and time.  Psychiatric:        Behavior: Behavior normal.     ED Results / Procedures / Treatments   Labs (all labs ordered are listed, but only abnormal results are displayed) Labs Reviewed - No data to display  EKG None  Radiology No results found.  Procedures .Epistaxis Management  Date/Time: 11/27/2022 4:06 PM  Performed by: Melene Plan, DO Authorized by: Melene Plan, DO   Consent:    Consent obtained:  Verbal   Consent given  by:  Patient   Risks, benefits, and alternatives were discussed: yes     Risks discussed:  Bleeding, infection and nasal injury   Alternatives discussed:  No treatment Universal protocol:    Procedure explained and questions answered to patient or proxy's satisfaction: yes     Patient identity confirmed:  Verbally with patient Anesthesia:    Anesthesia method:  None Procedure details:    Treatment site:  L anterior   Treatment method:  Silver nitrate   Treatment complexity:  Limited   Treatment episode: initial   Post-procedure details:    Assessment:  Bleeding stopped   Procedure completion:  Tolerated well, no immediate complications     Medications Ordered in ED Medications - No data to display  ED Course/ Medical Decision Making/ A&P                                 Medical Decision Making  77 yo F with a chief complaints of left-sided epistaxis.  This occurred a couple days ago then resolved then she had another episode today.  Was more profuse today and so she called 911.  Bleeding had stopped by the time she arrived here.  On my exam there is no obvious signs of continued bleeding.  She did have some hypervascularity to the left is a box plexus.  I discussed risk and benefits of cautery here electing to have the procedure performed.  Given ENT follow-up.  4:07 PM:  I have discussed the diagnosis/risks/treatment options with the patient.  Evaluation and diagnostic testing in the emergency department does not suggest an emergent condition requiring admission or immediate intervention beyond what has been performed at this time.  They will follow up with PCP, ENT. We also discussed returning to the ED immediately if new or worsening sx occur. We discussed the sx which are most concerning (e.g., sudden worsening pain, fever, inability to tolerate by mouth, bleeding not controlled with direct pressure) that necessitate immediate return. Medications administered to the patient during  their visit and any new prescriptions provided to the patient are listed below.  Medications given during this visit Medications - No data to display   The patient appears reasonably screen and/or stabilized for discharge and I doubt any other medical condition or other [facility] requiring further screening, evaluation, or treatment in the ED at this time prior to discharge.  Final Clinical Impression(s) / ED Diagnoses Final diagnoses:  Left-sided epistaxis    Rx / DC Orders ED Discharge Orders          Ordered    amoxicillin-clavulanate (AUGMENTIN) 875-125 MG tablet  Every 12 hours        11/27/22 1605              Melene Plan, DO 11/27/22 1607

## 2022-11-27 NOTE — ED Triage Notes (Signed)
Per [facility] pt coming from home for epistaxis x 1 hour. Had episode 2 days ago and eventually subsided. No blood thinners. Bleeding controlled at this time.

## 2022-11-27 NOTE — Discharge Instructions (Signed)
No blowing your nose rubbing her nose as best you can.  Place a small amount of antibiotic ointment to the tip of your pinky finger and rub just on the inside of the nostril a couple times a day.  He can use a humidifier in your bedroom at night as well.  I have given you information to follow-up with ENT.  If you have recurrent bleeding then please hold pressure for 15 minutes without stopping if you tried this twice and it does not work then please return to the Emergency Department for repeat evaluation.

## 2022-11-28 ENCOUNTER — Emergency Department (HOSPITAL_COMMUNITY)
Admission: EM | Admit: 2022-11-28 | Discharge: 2022-11-28 | Disposition: A | Discharge status: Home / Self Care | Payer: Medicare PPO | Attending: Emergency Medicine | Admitting: Emergency Medicine

## 2022-11-28 ENCOUNTER — Other Ambulatory Visit: Payer: Self-pay

## 2022-11-28 ENCOUNTER — Encounter (HOSPITAL_COMMUNITY): Payer: Self-pay

## 2022-11-28 DIAGNOSIS — Z79899 Other long term (current) drug therapy: Secondary | ICD-10-CM | POA: Diagnosis not present

## 2022-11-28 DIAGNOSIS — R58 Hemorrhage, not elsewhere classified: Secondary | ICD-10-CM | POA: Diagnosis not present

## 2022-11-28 DIAGNOSIS — J45909 Unspecified asthma, uncomplicated: Secondary | ICD-10-CM | POA: Insufficient documentation

## 2022-11-28 DIAGNOSIS — I10 Essential (primary) hypertension: Secondary | ICD-10-CM | POA: Insufficient documentation

## 2022-11-28 DIAGNOSIS — R040 Epistaxis: Secondary | ICD-10-CM | POA: Insufficient documentation

## 2022-11-28 DIAGNOSIS — R000 Tachycardia, unspecified: Secondary | ICD-10-CM | POA: Diagnosis not present

## 2022-11-28 NOTE — ED Provider Notes (Signed)
[place] EMERGENCY DEPARTMENT AT [facility] Provider Note   CSN: [id number] Arrival date & time: 11/28/22  6578     History  Chief Complaint  Patient presents with   Epistaxis    Sonia Beck is a 77 y.o. female with past medical history significant for hypertension, hyperlipidemia, asthma, thrombocytopenia, obesity presents to the ED via EMS from home for epistaxis.  Patient was seen on Tuesday night for same problem on the left and chemical cautery was performed.  Patient states tonight she began coughing and the bleeding restarted.  She reports blood was coming out of both nostrils.  She attempted to hold pressure to slow the bleeding, but this was unsuccessful.  EMS administered Afrin.  Denies blood thinner use or bruising/bleeding easily.         Home Medications Prior to Admission medications   Medication Sig Start Date End Date Taking? Authorizing Provider  acetaminophen (TYLENOL) 500 MG tablet Take 500 mg by mouth as needed. TAKE 2 TABLET AS NEEDED   Yes [provider]  amLODipine (NORVASC) 10 MG tablet Take 10 mg by mouth daily.  11/01/14  Yes [provider]  calcium-vitamin D (OSCAL WITH D) 500-200 MG-UNIT per tablet Take 1 tablet by mouth daily.   Yes [provider]  FLUoxetine (PROZAC) 20 MG capsule Take 20 mg by mouth daily.   Yes [provider]  montelukast (SINGULAIR) 10 MG tablet Take 10 mg by mouth at bedtime.   Yes [provider]  olmesartan (BENICAR) 40 MG tablet Take 1 tablet (40 mg total) by mouth daily. 09/30/22  Yes Marykay Lex, MD  pantoprazole (PROTONIX) 40 MG tablet Take 40 mg by mouth daily. 02/13/20  Yes [provider]  potassium chloride SA (K-DUR,KLOR-CON) 20 MEQ tablet Take 20 mEq by mouth daily.   Yes [provider]  rosuvastatin (CRESTOR) 5 MG tablet Take 1 tablet (5 mg total) by mouth 2 (two) times a week. Every Wednesday and Sunday 09/25/22  Yes Meng, Delta, PA   triamterene-hydrochlorothiazide (MAXZIDE-25) 37.5-25 MG per tablet Take 1 tablet by mouth daily.   Yes [provider]  ALPRAZolam (XANAX) 0.25 MG tablet Take 0.25 mg by mouth 2 (two) times daily as needed for anxiety.  Patient not taking: Reported on 11/28/2022    [provider]  amoxicillin-clavulanate (AUGMENTIN) 875-125 MG tablet Take 1 tablet by mouth every 12 (twelve) hours. Patient not taking: Reported on 11/28/2022 11/27/22   Melene Plan, DO      Allergies    Alendronate and Erythromycin    Review of Systems   Review of Systems  HENT:  Positive for nosebleeds, sinus pressure and sinus pain.   Hematological:  Does not bruise/bleed easily.    Physical Exam Updated Vital Signs BP (!) 148/80   Pulse 86   Temp (!) 97 F (36.1 C) (Axillary)   Resp (!) 28   Ht 5\' 1"  (1.549 m)   Wt 94.8 kg   SpO2 100%   BMI 39.49 kg/m  Physical Exam Vitals and nursing note reviewed.  Constitutional:      General: She is not in acute distress.    Appearance: Normal appearance. She is not ill-appearing or diaphoretic.  HENT:     Nose:     Right Nostril: No epistaxis or septal hematoma.     Left Nostril: Epistaxis (no active bleeding) present. No septal hematoma.     Comments: No visible bleeding anteriorly.  No obvious source of  bleeding.  Minimal amount of dried and clotted blood in both nares.      Mouth/Throat:     Lips: Pink.     Mouth: Mucous membranes are moist.     Pharynx: Oropharynx is clear. Uvula midline.     Comments: No bleeding or drainage into posterior oropharynx.   Cardiovascular:     Rate and Rhythm: Normal rate and regular rhythm.  Pulmonary:     Effort: Pulmonary effort is normal.  Skin:    General: Skin is warm and dry.     Capillary Refill: Capillary refill takes less than 2 seconds.  Neurological:     Mental Status: She is alert. Mental status is at baseline.  Psychiatric:        Mood and Affect: Mood normal.        Behavior: Behavior normal.      ED Results / Procedures / Treatments   Labs (all labs ordered are listed, but only abnormal results are displayed) Labs Reviewed - No data to display  EKG None  Radiology No results found.  Procedures Procedures    Medications Ordered in ED Medications - No data to display  ED Course/ Medical Decision Making/ A&P                                 Medical Decision Making  This patient presents to the ED with chief complaint(s) of epistaxis with pertinent past medical history of hypertension.  The complaint involves an extensive differential diagnosis and also carries with it a high risk of complications and morbidity.    Records Reviewed: Patient was seen by PCP on 11/12/22.  Review of labs demonstrates no thrombocytopenia.  Borderline anemia, Hgb 11.9.  Patient takes iron daily.    Initial Assessment:   On exam, patient is mildly anxious but not in acute distress.  Nose clamp with gauze in place.  Dried blood present to lips and tongue.  No visible blood in posterior oropharynx.    Treatment and Reassessment: Nose clamp left in place for 20 minutes.  After removal, no active bleeding is present.  Patient coughed up moderate blood clots.  Nose gently suctioned.  No obvious source of bleeding anteriorly.  Bleeding appears to be from left nostril.  Patient's mouth cleaned and she was given cup of water.  Will observe and reassess.    10:15 AM  Patient continues to have no bleeding.  She reports she has coughed some and bleeding did not restart.    10:15 AM  Patient is feeling overall better and is ready for discharge home.  She has not had any further epistaxis.   Disposition:   Discussed with patient continuing to use prescribed treatment for epistaxis and possible acute sinus infection.  Recommended holding off on saline nose sprays or rinses to help prevent disrupting any clot formation.  Advised patient to call ENT office today to schedule follow up.   The patient has  been appropriately medically screened and/or stabilized in the ED. I have low suspicion for any other emergent medical condition which would require further screening, evaluation or treatment in the ED or require inpatient management. At time of discharge the patient is hemodynamically stable and in no acute distress. I have discussed work-up results and diagnosis with patient and answered all questions. Patient is agreeable with discharge plan. We discussed strict return precautions for returning to the emergency department and they verbalized  understanding.             Final Clinical Impression(s) / ED Diagnoses Final diagnoses:  Epistaxis    Rx / DC Orders ED Discharge Orders     None         Lenard Simmer, PA-C 11/28/22 1015    Pricilla Loveless, MD 11/28/22 1042

## 2022-11-28 NOTE — Discharge Instructions (Addendum)
Thank you for allowing [place] to be a part of your care today.  You were evaluated in the ED for nosebleed.   Please continue to take your antibiotic as prescribed and use the Afrin nasal spray every 12 hours.  Use neosporin or Vaseline to both nostrils to help retain moisture.    Call the ENT office today and schedule a follow up appointment.   If you experience another bleed, use the Afrin spray in your nostril and hold pressure for at least 15 minutes.  You can use the provided nose clamps to help with holding pressure.  If the bleeding does not stop, call 911 and seek emergency medical care.   Return to the ED if you develop worsening of your symptoms, have a nosebleed that does not stop, or if you have any new concerns.

## 2022-11-28 NOTE — ED Notes (Signed)
Pt has nose clamp in place, no active bleeding at this time.

## 2022-11-28 NOTE — ED Triage Notes (Signed)
Per [facility] pt coming from home for epistaxis was seen on Tuesday nite for same was cauterized.  Tonight began coughing and epistaxis restarted.   EMS administered Afrin

## 2022-12-18 DIAGNOSIS — R040 Epistaxis: Secondary | ICD-10-CM | POA: Diagnosis not present

## 2022-12-25 ENCOUNTER — Other Ambulatory Visit: Payer: Self-pay | Admitting: Internal Medicine

## 2022-12-25 DIAGNOSIS — Z1231 Encounter for screening mammogram for malignant neoplasm of breast: Secondary | ICD-10-CM

## 2022-12-30 DIAGNOSIS — M7051 Other bursitis of knee, right knee: Secondary | ICD-10-CM | POA: Diagnosis not present

## 2022-12-30 DIAGNOSIS — M1711 Unilateral primary osteoarthritis, right knee: Secondary | ICD-10-CM | POA: Diagnosis not present

## 2023-01-14 DIAGNOSIS — M1711 Unilateral primary osteoarthritis, right knee: Secondary | ICD-10-CM | POA: Diagnosis not present

## 2023-01-14 DIAGNOSIS — M25561 Pain in right knee: Secondary | ICD-10-CM | POA: Diagnosis not present

## 2023-01-14 DIAGNOSIS — R040 Epistaxis: Secondary | ICD-10-CM | POA: Diagnosis not present

## 2023-01-21 DIAGNOSIS — M25561 Pain in right knee: Secondary | ICD-10-CM | POA: Diagnosis not present

## 2023-01-21 DIAGNOSIS — M1711 Unilateral primary osteoarthritis, right knee: Secondary | ICD-10-CM | POA: Diagnosis not present

## 2023-01-23 DIAGNOSIS — M1711 Unilateral primary osteoarthritis, right knee: Secondary | ICD-10-CM | POA: Diagnosis not present

## 2023-01-23 DIAGNOSIS — M25561 Pain in right knee: Secondary | ICD-10-CM | POA: Diagnosis not present

## 2023-01-25 DIAGNOSIS — Z23 Encounter for immunization: Secondary | ICD-10-CM | POA: Diagnosis not present

## 2023-01-31 DIAGNOSIS — M25561 Pain in right knee: Secondary | ICD-10-CM | POA: Diagnosis not present

## 2023-01-31 DIAGNOSIS — M1711 Unilateral primary osteoarthritis, right knee: Secondary | ICD-10-CM | POA: Diagnosis not present

## 2023-02-06 DIAGNOSIS — M25561 Pain in right knee: Secondary | ICD-10-CM | POA: Diagnosis not present

## 2023-02-06 DIAGNOSIS — M1711 Unilateral primary osteoarthritis, right knee: Secondary | ICD-10-CM | POA: Diagnosis not present

## 2023-02-10 DIAGNOSIS — M7051 Other bursitis of knee, right knee: Secondary | ICD-10-CM | POA: Diagnosis not present

## 2023-02-10 DIAGNOSIS — M1711 Unilateral primary osteoarthritis, right knee: Secondary | ICD-10-CM | POA: Diagnosis not present

## 2023-02-11 DIAGNOSIS — M25561 Pain in right knee: Secondary | ICD-10-CM | POA: Diagnosis not present

## 2023-02-11 DIAGNOSIS — M1711 Unilateral primary osteoarthritis, right knee: Secondary | ICD-10-CM | POA: Diagnosis not present

## 2023-02-18 DIAGNOSIS — M1711 Unilateral primary osteoarthritis, right knee: Secondary | ICD-10-CM | POA: Diagnosis not present

## 2023-03-13 ENCOUNTER — Ambulatory Visit
Admission: RE | Admit: 2023-03-13 | Discharge: 2023-03-13 | Disposition: A | Discharge status: Home / Self Care | Payer: Medicare PPO | Source: Ambulatory Visit | Attending: Internal Medicine | Admitting: Internal Medicine

## 2023-03-13 DIAGNOSIS — Z1231 Encounter for screening mammogram for malignant neoplasm of breast: Secondary | ICD-10-CM | POA: Diagnosis not present

## 2023-03-13 DIAGNOSIS — M25561 Pain in right knee: Secondary | ICD-10-CM | POA: Diagnosis not present

## 2023-03-13 DIAGNOSIS — M1711 Unilateral primary osteoarthritis, right knee: Secondary | ICD-10-CM | POA: Diagnosis not present

## 2023-03-31 DIAGNOSIS — M1711 Unilateral primary osteoarthritis, right knee: Secondary | ICD-10-CM | POA: Diagnosis not present

## 2023-03-31 DIAGNOSIS — M25561 Pain in right knee: Secondary | ICD-10-CM | POA: Diagnosis not present

## 2023-04-04 DIAGNOSIS — M1711 Unilateral primary osteoarthritis, right knee: Secondary | ICD-10-CM | POA: Diagnosis not present

## 2023-04-04 DIAGNOSIS — M25561 Pain in right knee: Secondary | ICD-10-CM | POA: Diagnosis not present

## 2023-04-29 ENCOUNTER — Ambulatory Visit: Payer: Medicare PPO | Attending: Cardiology | Admitting: Cardiology

## 2023-04-29 ENCOUNTER — Encounter: Payer: Self-pay | Admitting: Cardiology

## 2023-04-29 VITALS — BP 104/76 | HR 78 | Ht 61.0 in | Wt 181.2 lb

## 2023-04-29 DIAGNOSIS — I350 Nonrheumatic aortic (valve) stenosis: Secondary | ICD-10-CM | POA: Diagnosis not present

## 2023-04-29 DIAGNOSIS — E7849 Other hyperlipidemia: Secondary | ICD-10-CM

## 2023-04-29 DIAGNOSIS — I10 Essential (primary) hypertension: Secondary | ICD-10-CM | POA: Diagnosis not present

## 2023-04-29 DIAGNOSIS — E66812 Obesity, class 2: Secondary | ICD-10-CM | POA: Diagnosis not present

## 2023-04-29 DIAGNOSIS — Z6839 Body mass index (BMI) 39.0-39.9, adult: Secondary | ICD-10-CM | POA: Diagnosis not present

## 2023-04-29 DIAGNOSIS — E8881 Metabolic syndrome: Secondary | ICD-10-CM | POA: Diagnosis not present

## 2023-04-29 DIAGNOSIS — E7800 Pure hypercholesterolemia, unspecified: Secondary | ICD-10-CM

## 2023-04-29 MED ORDER — ROSUVASTATIN CALCIUM 5 MG PO TABS: ORAL_TABLET | ORAL | 3 refills | Status: DC

## 2023-04-29 NOTE — Progress Notes (Signed)
Cardiology Office Note:  .   Date:  05/03/2023  ID:  Sonia Beck, DOB 22-Apr-1945, MRN [id number] PCP: Sonia Dills, MD  Sonia Beck:  Sonia Lemma, MD     Chief Complaint  Patient presents with   Follow-up    Doing remarkably well.  He is lost quite a bit of weight.     Hypertension    BP is stable.    Patient Profile: Sonia Beck     Sonia Beck is a obese 78 y.o. female with a PMH notable for HTN, HLD, mild AS, family history of AAA who presents here for 76-month follow-up at the request of Sonia Dills, MD.    I last saw Sonia Beck in May 2023 for follow-up after having echocardiogram done revealing mild AS but otherwise normal study.  No evidence much better pressures at home than in the office.  Was exercising doing Silver sneakers maybe 2 to 3 days a week.  Also doing PT for shoulder pain.  Looking into doing tai chi classes.  No cardiac symptoms.  Still trial his weight. => Recommended increasing Benicar to 40 mg and rosuvastatin to twice a week.  Also suggested it was okay to use [facility]  She was most recently seen by Sonia Course, PA on September 25, 2022.  BP remained elevated but had missed couple doses of olmesartan having run out of the prescription.  Both telmisartan and Crestor were refilled.  She also had not taken her amlodipine but did take her Maxide.  She was only taking her statin twice a week.  No cardiac symptoms.  Occasional dizziness lying on the right side.  Right knee arthritis limits activity.  Subjective  Discussed the use of AI scribe software for clinical note transcription with the pati patient also ent, who gave verbal consent to proceed.  History of Present Illness   Sonia Beck, a patient with a history of hypertension, presented for a follow-up visit after a significant weight loss of approximately 28 pounds since the last visit in August of the previous year. The weight loss was achieved through a diet consisting  of fruits, vegetables, and low-sodium Healthy Choice frozen meals. The patient also incorporated physical therapy and resumed work as a Lawyer, which contributed to increased physical activity.  The patient reported no issues with the current antihypertensive regimen, which includes amlodipine 10 mg, Maxzide 37.5-25, and Benicar 40 mg. The patient spaces out the medication doses throughout the day. The patient's blood pressure readings have been consistently well-controlled, with no reported episodes of dizziness or lightheadedness.  The patient also reported undergoing physical therapy for a right knee issue, which has improved with weight loss and strengthening exercises. The patient denied any chest pain, pressure, or tightness, headaches, blurred vision, or episodes of syncope. The patient sleeps with the head of the bed elevated for comfort, not due to dyspnea or GERD.  The patient reported a history of nosebleeds, which led to a consultation with an ENT specialist. The nosebleeds were attributed to dryness and resolved with cauterization and the use of Afrin. The patient denied any blood in the stools or urine. The patient also reported occasional gas, which was attributed to an increased intake of fruits.  The patient's cholesterol levels were last checked in August, and the patient was on Crestor twice a week. The patient denied taking aspirin or Advil, and only used Tylenol for pain management. The patient was due for a follow-up echocardiogram in  a year due to mild aortic stenosis.     Cardiovascular ROS: no chest pain or dyspnea on exertion negative for - dyspnea on exertion, edema, irregular heartbeat, orthopnea, palpitations, paroxysmal nocturnal dyspnea, rapid heart rate, shortness of breath, or syncope/near syncope, TIA/amaurosis fugax - no recent Epistaxis, melena, hematochezia, hematuria-- dark stool 2/2 Fe supplement.  ROS:  Review of Systems - Negative except R knee  pain MEASUREMENTS: Weight loss of approximately 28 pounds since last visit CARDIOVASCULAR: No chest pain, pressure, or tightness EXTREMITIES: No leg swelling NEUROLOGICAL: No headaches, blurred vision, dizzy spells, or palpitations   Objective    Studies Reviewed: Sonia Beck   EKG Interpretation Date/Time:  Tuesday April 29 2023 10:20:49 EST Ventricular Rate:  76 PR Interval:  168 QRS Duration:  88 QT Interval:  360 QTC Calculation: 405 R Axis:   -11  Text Interpretation: Normal sinus rhythm Normal ECG When compared with ECG of 25-Sep-2022 08:10, No significant change was found Confirmed by Sonia Beck ([id number]) on 04/29/2023 10:24:16 AM    LABS Total cholesterol: 194 mg/dL (60/4540) HDL: 72 mg/dL (98/1191) LDL: 478 mg/dL (29/5621) Triglycerides: 78 mg/dL (30/8657) Q4O: 6% (96/2952) Hemoglobin: 11.9 g/dL (84/1324) Creatinine: 0.9 mg/dL (40/1027) Neopterin: 4.4 (11/2022) Liver function tests: Normal (11/2022)  DIAGNOSTIC Echocardiogram: Mild aortic stenosis EF 60 to 65%.  GR 1 DD.  Trivial MR, mild AS.  (Mean AVG 13 mmHg).(09/2021)  Risk Assessment/Calculations:    Physical Exam:   VS:  BP 104/76   Pulse 78   Ht 5\' 1"  (1.549 m)   Wt 181 lb 3.2 oz (82.2 kg)   SpO2 97%   BMI 34.24 kg/m    Wt Readings from Last 3 Encounters:  04/29/23 181 lb 3.2 oz (82.2 kg)  11/28/22 208 lb 15.9 oz (94.8 kg)  11/27/22 209 lb (94.8 kg)    GEN: Well nourished, well developed in no acute distress; Now Mild-Moderately Obese NECK: No JVD; No carotid bruits CARDIAC:  RRR, Normal S1, S2; 1/6 SEM at RUSB otherwise no murmurs, rubs, gallops RESPIRATORY:  Clear to auscultation without rales, wheezing or rhonchi ; nonlabored, good air movement. ABDOMEN: Soft, non-tender, non-distended EXTREMITIES:  No edema; No deformity     ASSESSMENT AND PLAN: .    Problem List Items Addressed This Visit       Cardiology Problems   Essential hypertension (Chronic)   Blood pressure well controlled,  potentially slightly low. Patient is currently on Amlodipine 10mg , Maxzide 37.25mg , and Benicar 40mg . -Monitor blood pressure and consider reducing medication if consistently low.      Relevant Medications   rosuvastatin (CRESTOR) 5 MG tablet   Other Relevant Orders   EKG 12-Lead (Completed)   Hyperlipidemia due to dietary fat intake (Chronic)   Patient is on Crestor twice a week. Last LDL was slightly elevated at 108. -Continue current regimen and recheck lipid panel at next visit with Dr. Nehemiah Settle.      Relevant Medications   rosuvastatin (CRESTOR) 5 MG tablet   Other Relevant Orders   EKG 12-Lead (Completed)   Mild aortic stenosis by prior echocardiography - Primary (Chronic)   No change in symptoms.  Mean gradient was 13 mmHg. -Schedule echocardiogram for January 2026 and follow-up appointment afterwards.      Relevant Medications   rosuvastatin (CRESTOR) 5 MG tablet   Other Relevant Orders   EKG 12-Lead (Completed)   ECHOCARDIOGRAM COMPLETE     Other   Class 2 severe obesity with serious comorbidity and body mass index (BMI) of  39.0 to 39.9 in adult [facility]) (Chronic)   Significant weight loss of 28 pounds since last visit through dietary changes and increased physical activity. -Continue current diet and exercise regimen.      Metabolic syndrome -> at risk for cardiac disease (Chronic)   BP well-controlled.  Lipids relatively well-controlled, not quite at goal but at max tolerated dose of statin.  A1c also relatively well-controlled. Active exercise without any notable symptoms. -Continue current meds for now.  Labs followed by PCP.      Other Visit Diagnoses       Hypercholesterolemia       Relevant Medications   rosuvastatin (CRESTOR) 5 MG tablet   Other Relevant Orders   EKG 12-Lead (Completed)        General Health Maintenance -Continue current medications and lifestyle modifications. -Check cholesterol levels at next visit with Dr. Nehemiah Settle. -Schedule  echocardiogram for January 2026 and follow-up appointment afterwards.     Follow-Up: Return in about 1 year (around 04/28/2024) for Routine follow up with me.  I spent 35 minutes in the care of [name] today including reviewing outside labs from [facility]/PCP (2 min), reviewing studies (2 min), face to face time discussing treatment options (22 min ), reviewing records from prior visits  (3 min), 6 min dictating, and documenting in the encounter.     Signed, Marykay Lex, MD, MS Sonia Beck, M.D., M.S. Interventional Beck  [facility]  Pager # [phone] Phone # [phone] [address] [place], [place] [address]

## 2023-04-29 NOTE — Patient Instructions (Addendum)
Medication Instructions:   No changes -   *If you need a refill on your cardiac medications before your next appointment, please call your pharmacy*   Lab Work: Not needed    Testing/Procedures: Will be schedule Jan 2026 - [address]  Your physician has requested that you have an echocardiogram. Echocardiography is a painless test that uses sound waves to create images of your heart. It provides your doctor with information about the size and shape of your heart and how well your heart's chambers and valves are working. This procedure takes approximately one hour. There are no restrictions for this procedure. Please do NOT wear cologne, perfume, aftershave, or lotions (deodorant is allowed). Please arrive 15 minutes prior to your appointment time.  Please note: We ask at that you not bring children with you during ultrasound (echo/ vascular) testing. Due to room size and safety concerns, children are not allowed in the ultrasound rooms during exams. Our front office staff cannot provide observation of children in our lobby area while testing is being conducted. An adult accompanying a patient to their appointment will only be allowed in the ultrasound room at the discretion of the ultrasound technician under special circumstances. We apologize for any inconvenience.    Follow-Up: At [facility], you and your health needs are our priority.  As part of our continuing mission to provide you with exceptional heart care, we have created designated Provider Care Teams.  These Care Teams include your primary Cardiologist (physician) and Advanced Practice Providers (APPs -  Physician Assistants and Nurse Practitioners) who all work together to provide you with the care you need, when you need it.     Your next appointment:   12 month(s)  The format for your next appointment:   In Person  Provider:   Bryan Lemma, MD    Other Instructions

## 2023-05-03 ENCOUNTER — Encounter: Payer: Self-pay | Admitting: Cardiology

## 2023-05-03 NOTE — Assessment & Plan Note (Signed)
Blood pressure well controlled, potentially slightly low. Patient is currently on Amlodipine 10mg , Maxzide 37.25mg , and Benicar 40mg . -Monitor blood pressure and consider reducing medication if consistently low.

## 2023-05-03 NOTE — Assessment & Plan Note (Signed)
Significant weight loss of 28 pounds since last visit through dietary changes and increased physical activity. -Continue current diet and exercise regimen.

## 2023-05-03 NOTE — Assessment & Plan Note (Signed)
Patient is on Crestor twice a week. Last LDL was slightly elevated at 108. -Continue current regimen and recheck lipid panel at next visit with Dr. Nehemiah Settle.

## 2023-05-03 NOTE — Assessment & Plan Note (Signed)
BP well-controlled.  Lipids relatively well-controlled, not quite at goal but at max tolerated dose of statin.  A1c also relatively well-controlled. Active exercise without any notable symptoms. -Continue current meds for now.  Labs followed by PCP.

## 2023-05-03 NOTE — Assessment & Plan Note (Addendum)
No change in symptoms.  Mean gradient was 13 mmHg. -Schedule echocardiogram for January 2026 and follow-up appointment afterwards.

## 2023-05-15 DIAGNOSIS — E1169 Type 2 diabetes mellitus with other specified complication: Secondary | ICD-10-CM | POA: Diagnosis not present

## 2023-05-15 DIAGNOSIS — F419 Anxiety disorder, unspecified: Secondary | ICD-10-CM | POA: Diagnosis not present

## 2023-05-15 DIAGNOSIS — I10 Essential (primary) hypertension: Secondary | ICD-10-CM | POA: Diagnosis not present

## 2023-05-15 DIAGNOSIS — N1831 Chronic kidney disease, stage 3a: Secondary | ICD-10-CM | POA: Diagnosis not present

## 2023-05-15 DIAGNOSIS — E0822 Diabetes mellitus due to underlying condition with diabetic chronic kidney disease: Secondary | ICD-10-CM | POA: Diagnosis not present

## 2023-05-15 DIAGNOSIS — E7800 Pure hypercholesterolemia, unspecified: Secondary | ICD-10-CM | POA: Diagnosis not present

## 2023-05-15 DIAGNOSIS — I350 Nonrheumatic aortic (valve) stenosis: Secondary | ICD-10-CM | POA: Diagnosis not present

## 2023-05-15 DIAGNOSIS — F321 Major depressive disorder, single episode, moderate: Secondary | ICD-10-CM | POA: Diagnosis not present

## 2023-05-16 ENCOUNTER — Telehealth: Payer: Self-pay | Admitting: Cardiology

## 2023-05-16 DIAGNOSIS — M25561 Pain in right knee: Secondary | ICD-10-CM | POA: Diagnosis not present

## 2023-05-16 DIAGNOSIS — R260 Ataxic gait: Secondary | ICD-10-CM | POA: Diagnosis not present

## 2023-05-16 NOTE — Telephone Encounter (Signed)
 Unfortunately Dr. Aldona Hunger labs are not in epic.  So I cannot actually see them.  If we can contact Dr. Sydney Eve office to get the labs sent/faxed over, I can review them.   Randene Bustard, MD

## 2023-05-16 NOTE — Telephone Encounter (Signed)
 New Messasge:         Patient sid ehn she saw Dr Anner last month, he wanted to know when had she seen her primary doctor(Dr Polite). She wants Dr Anner to know that she saw him yesterday(05-15-23) and she did have lab work yesterday. She said Dr Anner can review the results  via Epic if he likes.

## 2023-05-30 DIAGNOSIS — M25561 Pain in right knee: Secondary | ICD-10-CM | POA: Diagnosis not present

## 2023-05-30 DIAGNOSIS — R260 Ataxic gait: Secondary | ICD-10-CM | POA: Diagnosis not present

## 2023-06-02 DIAGNOSIS — H524 Presbyopia: Secondary | ICD-10-CM | POA: Diagnosis not present

## 2023-06-02 DIAGNOSIS — H35361 Drusen (degenerative) of macula, right eye: Secondary | ICD-10-CM | POA: Diagnosis not present

## 2023-06-02 DIAGNOSIS — H40013 Open angle with borderline findings, low risk, bilateral: Secondary | ICD-10-CM | POA: Diagnosis not present

## 2023-06-02 DIAGNOSIS — H25813 Combined forms of age-related cataract, bilateral: Secondary | ICD-10-CM | POA: Diagnosis not present

## 2023-06-02 DIAGNOSIS — H04123 Dry eye syndrome of bilateral lacrimal glands: Secondary | ICD-10-CM | POA: Diagnosis not present

## 2023-06-20 DIAGNOSIS — R260 Ataxic gait: Secondary | ICD-10-CM | POA: Diagnosis not present

## 2023-06-20 DIAGNOSIS — M25561 Pain in right knee: Secondary | ICD-10-CM | POA: Diagnosis not present

## 2023-06-25 ENCOUNTER — Ambulatory Visit (INDEPENDENT_AMBULATORY_CARE_PROVIDER_SITE_OTHER)

## 2023-06-25 ENCOUNTER — Encounter (HOSPITAL_COMMUNITY): Payer: Self-pay | Admitting: Emergency Medicine

## 2023-06-25 ENCOUNTER — Ambulatory Visit (HOSPITAL_COMMUNITY)
Admission: EM | Admit: 2023-06-25 | Discharge: 2023-06-25 | Disposition: A | Discharge status: Home / Self Care | Attending: Emergency Medicine | Admitting: Emergency Medicine

## 2023-06-25 DIAGNOSIS — M79644 Pain in right finger(s): Secondary | ICD-10-CM | POA: Diagnosis not present

## 2023-06-25 NOTE — Discharge Instructions (Addendum)
 Your x-rays did not show any obvious fracture or deformity.  It appears that you have strained your thumb.  Please wear the brace to provide to support.  You can ice and elevate your hand to help with swelling and pain.  Continue taking your Tylenol 500 mg every 6-8 hours as needed for pain.  Follow-up with your orthopedic as scheduled on Friday, and follow-up with them for any continued thumb pain.

## 2023-06-25 NOTE — ED Provider Notes (Addendum)
 [facility]    CSN: [id number] Arrival date & time: 06/25/23  1322      History   Chief Complaint Chief Complaint  Patient presents with   Finger Injury    HPI Sonia Beck is a 78 y.o. female.   Patient presents to clinic over concerns of right thumb pain and swelling.  Yesterday she was sitting on her rolling walker and bent forward to play with the child and she slid out from the walker.  She did not hit her head or pass out.  She kind of rolled.  She may have caught herself with her hands, she is unsure.  Noticed last night that she was having right thumb pain and swelling, limitation to range of motion.  She has arthritis in her knees so she took Tylenol for this and for her thumb.   Denies hx of gout. Hx of HTN, HLD, OA and gout per medical records.   Hmg A1C 1 year ago was 6.2.   The history is provided by the patient and medical records.    Past Medical History:  Diagnosis Date   Anxiety    Anxiety    Asthma    Back pain    Bone spur    bilateral shoulders   Cardiac murmur Echo October 2013   Aortic sclerosis, not stenosis   Chronic renal failure syndrome, stage 2 (mild)    Depression    Depression    Eczema    Gout    Heartburn    Hyperlipidemia    Hypertension    Joint pain    Obesity (BMI 30-39.9)    Former weight 224 pounds with BMI of 41 -- 65 pound weight loss in one year   Osteoarthritis    Prediabetes    [facility] spotted fever 2021   SOBOE (shortness of breath on exertion)    Thrombocytopenia (HCC)     Patient Active Problem List   Diagnosis Date Noted   Pes anserinus bursitis of right knee 12/30/2022   Epistaxis 12/18/2022   Pain in joint of right knee 10/01/2022   Diabetes mellitus (HCC) 10/18/2021   Mild aortic stenosis by prior echocardiography 04/25/2021   Asthma without status asthmaticus 08/15/2020   Chronic kidney disease, stage 2 (mild) 08/15/2020   Gastroesophageal reflux disease with esophagitis  08/15/2020   Gout 08/15/2020   Moderate major depression, single episode (HCC) 08/15/2020   Encounter for general adult medical examination without abnormal findings 08/15/2020   Osteopenia 08/15/2020   Osteoarthritis of multiple joints 06/21/2020   Vitamin D deficiency 05/16/2020   Other fatigue 05/02/2020   SOBOE (shortness of breath on exertion) 05/02/2020   Prediabetes 05/02/2020   Other hyperlipidemia 05/02/2020   Anxiety disorder 05/02/2020   Metabolic syndrome -> at risk for cardiac disease 05/02/2020   Family history of abdominal aortic aneurysm (AAA) 10/02/2016   Class 2 severe obesity with serious comorbidity and body mass index (BMI) of 39.0 to 39.9 in adult (HCC) 11/13/2012   Essential hypertension    Hyperlipidemia due to dietary fat intake     Past Surgical History:  Procedure Laterality Date   ABDOMINAL HYSTERECTOMY  1991   COLONOSCOPY  2021   DOPPLER ECHOCARDIOGRAPHY  04/2021   EF 60 to 65%.  No RWMA.  Mild LVH with GR 1 DD.  Normal RV function/RVP.  Mild aortic stenosis-mean gradient 13 mm 3.   NM MYOCAR PERF WALL MOTION  01/30/2001   EF 69% ,LOW RISK,mild ant  wall thinning due to breast attenuation   TRANSTHORACIC ECHOCARDIOGRAM  10/2016   mild LVH.  Focal basal.  EF 55 to 60%.  No R WMA.  GR 1 DD.  Mildly elevated PA pressures of 33 mmHg.  No obvious valve lesion to explain murmur    OB History     Gravida  0   Para  0   Term  0   Preterm  0   AB  0   Living  0      SAB  0   IAB  0   Ectopic  0   Multiple  0   Live Births  0            Home Medications    Prior to Admission medications   Medication Sig Start Date End Date Taking? Authorizing Provider  acetaminophen (TYLENOL) 500 MG tablet Take 500 mg by mouth as needed. TAKE 2 TABLET AS NEEDED    [provider]  ALPRAZolam (XANAX) 0.25 MG tablet Take 0.25 mg by mouth 2 (two) times daily as needed for anxiety.    [provider]  amLODipine (NORVASC) 10 MG  tablet Take 10 mg by mouth daily.  11/01/14   [provider]  amoxicillin-clavulanate (AUGMENTIN) 875-125 MG tablet Take 1 tablet by mouth every 12 (twelve) hours.    [provider]  calcium-vitamin D (OSCAL WITH D) 500-200 MG-UNIT per tablet Take 1 tablet by mouth daily.    [provider]  Fluocinolone Acetonide 0.01 % OIL Place 4 drops in ear(s) 2 (two) times daily. 01/14/23   [provider]  FLUoxetine (PROZAC) 20 MG capsule Take 20 mg by mouth daily.    [provider]  montelukast (SINGULAIR) 10 MG tablet Take 10 mg by mouth at bedtime.    [provider]  olmesartan (BENICAR) 40 MG tablet Take 1 tablet (40 mg total) by mouth daily. 09/30/22   Marykay Lex, MD  pantoprazole (PROTONIX) 40 MG tablet Take 40 mg by mouth daily. 02/13/20   [provider]  potassium chloride SA (K-DUR,KLOR-CON) 20 MEQ tablet Take 20 mEq by mouth daily.    [provider]  rosuvastatin (CRESTOR) 5 MG tablet Take 1 tablet (5 mg total) by mouth 2 (two) times a week. Every Wednesday and Sunday 04/29/23   Marykay Lex, MD  [facility] injection Inject 0.5 mLs into the muscle once. 02/17/23   [provider]  [facility] syringe Inject 0.5 mLs into the muscle once. 01/31/23   [provider]  triamterene-hydrochlorothiazide (MAXZIDE-25) 37.5-25 MG per tablet Take 1 tablet by mouth daily.    [provider]    Family History Family History  Problem Relation Age of Onset   Heart disease Mother    Hypertension Mother    Hyperlipidemia Mother    Cancer Father    Heart attack Brother 10   BRCA 1/2 Neg Hx    Breast cancer Neg Hx     Social History Social History   Tobacco Use   Smoking status: Never   Smokeless tobacco: Never  Vaping Use   Vaping status: Never Used  Substance Use Topics   Alcohol use: No   Drug use: No     Allergies   Alendronate and Erythromycin   Review of Systems Review of  Systems  Per HPI   Physical Exam Triage Vital Signs ED Triage Vitals  Encounter Vitals Group     BP 06/25/23 1421 (!) 153/79  Systolic BP Percentile --      Diastolic BP Percentile --      Pulse Rate 06/25/23 1421 77     Resp 06/25/23 1421 18     Temp 06/25/23 1421 98.1 F (36.7 C)     Temp Source 06/25/23 1421 Oral     SpO2 06/25/23 1421 98 %     Weight --      Height --      Head Circumference --      Peak Flow --      Pain Score 06/25/23 1420 6     Pain Loc --      Pain Education --      Exclude from Growth Chart --    No data found.  Updated Vital Signs BP (!) 153/79 (BP Location: Left Arm)   Pulse 77   Temp 98.1 F (36.7 C) (Oral)   Resp 18   SpO2 98%   Visual Acuity Right Eye Distance:   Left Eye Distance:   Bilateral Distance:    Right Eye Near:   Left Eye Near:    Bilateral Near:     Physical Exam Vitals and nursing note reviewed.  Constitutional:      Appearance: Normal appearance.  HENT:     Head: Normocephalic and atraumatic.     Right Ear: External ear normal.     Left Ear: External ear normal.     Nose: Nose normal.     Mouth/Throat:     Mouth: Mucous membranes are moist.  Eyes:     Conjunctiva/sclera: Conjunctivae normal.  Cardiovascular:     Rate and Rhythm: Normal rate.  Pulmonary:     Effort: Pulmonary effort is normal. No respiratory distress.  Musculoskeletal:     Right hand: Swelling present. Decreased range of motion. There is no disruption of two-point discrimination. Normal capillary refill. Normal pulse.     Comments: Mild swelling, changes to ROM with right thumb, no obvious erythema.  Skin:    General: Skin is warm and dry.  Neurological:     General: No focal deficit present.     Mental Status: She is alert.  Psychiatric:        Mood and Affect: Mood normal.        Behavior: Behavior is cooperative.      UC Treatments / Results  Labs (all labs ordered are listed, but only abnormal results are  displayed) Labs Reviewed - No data to display  EKG   Radiology No results found.  Procedures Procedures (including critical care time)  Medications Ordered in UC Medications - No data to display  Initial Impression / Assessment and Plan / UC Course  I have reviewed the triage vital signs and the nursing notes.  Pertinent labs & imaging results that were available during my care of the patient were reviewed by me and considered in my medical decision making (see chart for details).  Vitals and triage reviewed, patient is hemodynamically stable.  Right thumb with swelling and pain with range of motion.  May have caught herself with an outstretched hand during a fall from her rollator.  Imaging by my interpretation does not reveal any obvious fracture or deformity.  Will provide with thumb spica and treat conservatively with RICE.  Of care, follow-up care return precautions given, no questions at this time.  Official radiology overread is negative.  No changes in treatment plan at this time.    Final Clinical Impressions(s) / UC Diagnoses  Final diagnoses:  Thumb pain, right     Discharge Instructions      Your x-rays did not show any obvious fracture or deformity.  It appears that you have strained your thumb.  Please wear the brace to provide to support.  You can ice and elevate your hand to help with swelling and pain.  Continue taking your Tylenol 500 mg every 6-8 hours as needed for pain.  Follow-up with your orthopedic as scheduled on Friday, and follow-up with them for any continued thumb pain.      ED Prescriptions   None    PDMP not reviewed this encounter.   Shekira Drummer, [place] N, FNP 06/25/23 1544    Akhil Piscopo, [place] N, [place] 06/25/23 1655

## 2023-06-25 NOTE — ED Triage Notes (Signed)
 Pt reports was playing with some children in front of her and fell off a roller chair. Pt c/o right thumb pain. Took Tylenol today. Pain is only with movement.

## 2023-06-27 DIAGNOSIS — M1711 Unilateral primary osteoarthritis, right knee: Secondary | ICD-10-CM | POA: Diagnosis not present

## 2023-06-27 DIAGNOSIS — M79644 Pain in right finger(s): Secondary | ICD-10-CM | POA: Diagnosis not present

## 2023-06-27 DIAGNOSIS — M7051 Other bursitis of knee, right knee: Secondary | ICD-10-CM | POA: Diagnosis not present

## 2023-07-04 DIAGNOSIS — M25561 Pain in right knee: Secondary | ICD-10-CM | POA: Diagnosis not present

## 2023-07-04 DIAGNOSIS — R260 Ataxic gait: Secondary | ICD-10-CM | POA: Diagnosis not present

## 2023-07-11 DIAGNOSIS — M25561 Pain in right knee: Secondary | ICD-10-CM | POA: Diagnosis not present

## 2023-07-11 DIAGNOSIS — R260 Ataxic gait: Secondary | ICD-10-CM | POA: Diagnosis not present

## 2023-07-22 DIAGNOSIS — R260 Ataxic gait: Secondary | ICD-10-CM | POA: Diagnosis not present

## 2023-07-22 DIAGNOSIS — M25561 Pain in right knee: Secondary | ICD-10-CM | POA: Diagnosis not present

## 2023-07-24 DIAGNOSIS — R260 Ataxic gait: Secondary | ICD-10-CM | POA: Diagnosis not present

## 2023-07-24 DIAGNOSIS — M25561 Pain in right knee: Secondary | ICD-10-CM | POA: Diagnosis not present

## 2023-08-04 DIAGNOSIS — R260 Ataxic gait: Secondary | ICD-10-CM | POA: Diagnosis not present

## 2023-08-04 DIAGNOSIS — M25561 Pain in right knee: Secondary | ICD-10-CM | POA: Diagnosis not present

## 2023-08-28 DIAGNOSIS — J0101 Acute recurrent maxillary sinusitis: Secondary | ICD-10-CM | POA: Diagnosis not present

## 2023-08-28 DIAGNOSIS — J069 Acute upper respiratory infection, unspecified: Secondary | ICD-10-CM | POA: Diagnosis not present

## 2023-08-29 DIAGNOSIS — M25561 Pain in right knee: Secondary | ICD-10-CM | POA: Diagnosis not present

## 2023-08-29 DIAGNOSIS — R260 Ataxic gait: Secondary | ICD-10-CM | POA: Diagnosis not present

## 2023-09-04 DIAGNOSIS — M25561 Pain in right knee: Secondary | ICD-10-CM | POA: Diagnosis not present

## 2023-09-04 DIAGNOSIS — R260 Ataxic gait: Secondary | ICD-10-CM | POA: Diagnosis not present

## 2023-09-12 DIAGNOSIS — M25561 Pain in right knee: Secondary | ICD-10-CM | POA: Diagnosis not present

## 2023-09-12 DIAGNOSIS — R260 Ataxic gait: Secondary | ICD-10-CM | POA: Diagnosis not present

## 2023-09-18 DIAGNOSIS — M25561 Pain in right knee: Secondary | ICD-10-CM | POA: Diagnosis not present

## 2023-09-18 DIAGNOSIS — R260 Ataxic gait: Secondary | ICD-10-CM | POA: Diagnosis not present

## 2023-09-22 DIAGNOSIS — M25561 Pain in right knee: Secondary | ICD-10-CM | POA: Diagnosis not present

## 2023-09-22 DIAGNOSIS — R260 Ataxic gait: Secondary | ICD-10-CM | POA: Diagnosis not present

## 2023-09-24 DIAGNOSIS — M25561 Pain in right knee: Secondary | ICD-10-CM | POA: Diagnosis not present

## 2023-09-24 DIAGNOSIS — R260 Ataxic gait: Secondary | ICD-10-CM | POA: Diagnosis not present

## 2023-09-26 DIAGNOSIS — M25561 Pain in right knee: Secondary | ICD-10-CM | POA: Diagnosis not present

## 2023-09-26 DIAGNOSIS — R260 Ataxic gait: Secondary | ICD-10-CM | POA: Diagnosis not present

## 2023-09-29 DIAGNOSIS — R260 Ataxic gait: Secondary | ICD-10-CM | POA: Diagnosis not present

## 2023-09-29 DIAGNOSIS — M25561 Pain in right knee: Secondary | ICD-10-CM | POA: Diagnosis not present

## 2023-10-01 DIAGNOSIS — M25561 Pain in right knee: Secondary | ICD-10-CM | POA: Diagnosis not present

## 2023-10-01 DIAGNOSIS — R260 Ataxic gait: Secondary | ICD-10-CM | POA: Diagnosis not present

## 2023-10-03 DIAGNOSIS — M25561 Pain in right knee: Secondary | ICD-10-CM | POA: Diagnosis not present

## 2023-10-03 DIAGNOSIS — R260 Ataxic gait: Secondary | ICD-10-CM | POA: Diagnosis not present

## 2023-10-06 DIAGNOSIS — R260 Ataxic gait: Secondary | ICD-10-CM | POA: Diagnosis not present

## 2023-10-06 DIAGNOSIS — M25561 Pain in right knee: Secondary | ICD-10-CM | POA: Diagnosis not present

## 2023-10-08 DIAGNOSIS — M25561 Pain in right knee: Secondary | ICD-10-CM | POA: Diagnosis not present

## 2023-10-08 DIAGNOSIS — R260 Ataxic gait: Secondary | ICD-10-CM | POA: Diagnosis not present

## 2023-10-13 DIAGNOSIS — M25561 Pain in right knee: Secondary | ICD-10-CM | POA: Diagnosis not present

## 2023-10-13 DIAGNOSIS — R260 Ataxic gait: Secondary | ICD-10-CM | POA: Diagnosis not present

## 2023-10-15 DIAGNOSIS — M25561 Pain in right knee: Secondary | ICD-10-CM | POA: Diagnosis not present

## 2023-10-15 DIAGNOSIS — R260 Ataxic gait: Secondary | ICD-10-CM | POA: Diagnosis not present

## 2023-10-17 DIAGNOSIS — M25561 Pain in right knee: Secondary | ICD-10-CM | POA: Diagnosis not present

## 2023-10-17 DIAGNOSIS — R260 Ataxic gait: Secondary | ICD-10-CM | POA: Diagnosis not present

## 2023-10-20 ENCOUNTER — Other Ambulatory Visit: Payer: Self-pay | Admitting: Cardiology

## 2023-10-20 DIAGNOSIS — R260 Ataxic gait: Secondary | ICD-10-CM | POA: Diagnosis not present

## 2023-10-20 DIAGNOSIS — M25561 Pain in right knee: Secondary | ICD-10-CM | POA: Diagnosis not present

## 2023-10-22 DIAGNOSIS — M25561 Pain in right knee: Secondary | ICD-10-CM | POA: Diagnosis not present

## 2023-10-22 DIAGNOSIS — R260 Ataxic gait: Secondary | ICD-10-CM | POA: Diagnosis not present

## 2023-10-24 DIAGNOSIS — R260 Ataxic gait: Secondary | ICD-10-CM | POA: Diagnosis not present

## 2023-10-24 DIAGNOSIS — M25561 Pain in right knee: Secondary | ICD-10-CM | POA: Diagnosis not present

## 2023-10-27 DIAGNOSIS — R260 Ataxic gait: Secondary | ICD-10-CM | POA: Diagnosis not present

## 2023-10-27 DIAGNOSIS — M25561 Pain in right knee: Secondary | ICD-10-CM | POA: Diagnosis not present

## 2023-10-29 DIAGNOSIS — R260 Ataxic gait: Secondary | ICD-10-CM | POA: Diagnosis not present

## 2023-10-29 DIAGNOSIS — M25561 Pain in right knee: Secondary | ICD-10-CM | POA: Diagnosis not present

## 2023-10-31 DIAGNOSIS — R260 Ataxic gait: Secondary | ICD-10-CM | POA: Diagnosis not present

## 2023-10-31 DIAGNOSIS — M25561 Pain in right knee: Secondary | ICD-10-CM | POA: Diagnosis not present

## 2023-11-04 DIAGNOSIS — M25561 Pain in right knee: Secondary | ICD-10-CM | POA: Diagnosis not present

## 2023-11-04 DIAGNOSIS — R260 Ataxic gait: Secondary | ICD-10-CM | POA: Diagnosis not present

## 2023-11-06 DIAGNOSIS — R260 Ataxic gait: Secondary | ICD-10-CM | POA: Diagnosis not present

## 2023-11-06 DIAGNOSIS — M25561 Pain in right knee: Secondary | ICD-10-CM | POA: Diagnosis not present

## 2023-11-17 DIAGNOSIS — R260 Ataxic gait: Secondary | ICD-10-CM | POA: Diagnosis not present

## 2023-11-17 DIAGNOSIS — M25561 Pain in right knee: Secondary | ICD-10-CM | POA: Diagnosis not present

## 2023-11-19 DIAGNOSIS — M25561 Pain in right knee: Secondary | ICD-10-CM | POA: Diagnosis not present

## 2023-11-19 DIAGNOSIS — R260 Ataxic gait: Secondary | ICD-10-CM | POA: Diagnosis not present

## 2023-11-20 DIAGNOSIS — R260 Ataxic gait: Secondary | ICD-10-CM | POA: Diagnosis not present

## 2023-11-20 DIAGNOSIS — M25561 Pain in right knee: Secondary | ICD-10-CM | POA: Diagnosis not present

## 2023-11-25 DIAGNOSIS — M25561 Pain in right knee: Secondary | ICD-10-CM | POA: Diagnosis not present

## 2023-11-25 DIAGNOSIS — R260 Ataxic gait: Secondary | ICD-10-CM | POA: Diagnosis not present

## 2023-12-01 DIAGNOSIS — R260 Ataxic gait: Secondary | ICD-10-CM | POA: Diagnosis not present

## 2023-12-01 DIAGNOSIS — M25561 Pain in right knee: Secondary | ICD-10-CM | POA: Diagnosis not present

## 2023-12-05 DIAGNOSIS — M25561 Pain in right knee: Secondary | ICD-10-CM | POA: Diagnosis not present

## 2023-12-05 DIAGNOSIS — R260 Ataxic gait: Secondary | ICD-10-CM | POA: Diagnosis not present

## 2023-12-09 DIAGNOSIS — R260 Ataxic gait: Secondary | ICD-10-CM | POA: Diagnosis not present

## 2023-12-09 DIAGNOSIS — M25561 Pain in right knee: Secondary | ICD-10-CM | POA: Diagnosis not present

## 2024-01-05 ENCOUNTER — Other Ambulatory Visit: Payer: Self-pay | Admitting: Internal Medicine

## 2024-01-05 DIAGNOSIS — Z1231 Encounter for screening mammogram for malignant neoplasm of breast: Secondary | ICD-10-CM

## 2024-01-10 DIAGNOSIS — Z23 Encounter for immunization: Secondary | ICD-10-CM | POA: Diagnosis not present

## 2024-01-23 DIAGNOSIS — Z111 Encounter for screening for respiratory tuberculosis: Secondary | ICD-10-CM | POA: Diagnosis not present

## 2024-01-23 DIAGNOSIS — Z23 Encounter for immunization: Secondary | ICD-10-CM | POA: Diagnosis not present

## 2024-01-26 DIAGNOSIS — F321 Major depressive disorder, single episode, moderate: Secondary | ICD-10-CM | POA: Diagnosis not present

## 2024-01-26 DIAGNOSIS — Z1331 Encounter for screening for depression: Secondary | ICD-10-CM | POA: Diagnosis not present

## 2024-01-26 DIAGNOSIS — E0822 Diabetes mellitus due to underlying condition with diabetic chronic kidney disease: Secondary | ICD-10-CM | POA: Diagnosis not present

## 2024-01-26 DIAGNOSIS — E7800 Pure hypercholesterolemia, unspecified: Secondary | ICD-10-CM | POA: Diagnosis not present

## 2024-01-26 DIAGNOSIS — F419 Anxiety disorder, unspecified: Secondary | ICD-10-CM | POA: Diagnosis not present

## 2024-01-26 DIAGNOSIS — I350 Nonrheumatic aortic (valve) stenosis: Secondary | ICD-10-CM | POA: Diagnosis not present

## 2024-01-26 DIAGNOSIS — N1831 Chronic kidney disease, stage 3a: Secondary | ICD-10-CM | POA: Diagnosis not present

## 2024-01-26 DIAGNOSIS — Z0000 Encounter for general adult medical examination without abnormal findings: Secondary | ICD-10-CM | POA: Diagnosis not present

## 2024-01-26 DIAGNOSIS — I10 Essential (primary) hypertension: Secondary | ICD-10-CM | POA: Diagnosis not present

## 2024-01-26 DIAGNOSIS — E1169 Type 2 diabetes mellitus with other specified complication: Secondary | ICD-10-CM | POA: Diagnosis not present

## 2024-01-27 ENCOUNTER — Other Ambulatory Visit (HOSPITAL_BASED_OUTPATIENT_CLINIC_OR_DEPARTMENT_OTHER): Payer: Self-pay | Admitting: Internal Medicine

## 2024-01-27 DIAGNOSIS — M8580 Other specified disorders of bone density and structure, unspecified site: Secondary | ICD-10-CM

## 2024-03-11 ENCOUNTER — Other Ambulatory Visit: Payer: Self-pay

## 2024-03-15 ENCOUNTER — Ambulatory Visit
Admission: RE | Admit: 2024-03-15 | Discharge: 2024-03-15 | Disposition: A | Source: Ambulatory Visit | Attending: Internal Medicine | Admitting: Internal Medicine

## 2024-03-15 DIAGNOSIS — Z1231 Encounter for screening mammogram for malignant neoplasm of breast: Secondary | ICD-10-CM

## 2024-03-16 MED ORDER — ROSUVASTATIN CALCIUM 5 MG PO TABS: ORAL_TABLET | ORAL | 0 refills | Status: AC

## 2024-03-26 ENCOUNTER — Encounter: Payer: Self-pay | Admitting: Cardiology

## 2024-04-12 ENCOUNTER — Ambulatory Visit (HOSPITAL_COMMUNITY)
Admission: RE | Admit: 2024-04-12 | Discharge: 2024-04-12 | Disposition: A | Discharge status: Home / Self Care | Payer: Medicare PPO | Source: Ambulatory Visit | Attending: Cardiology | Admitting: Cardiology

## 2024-04-12 DIAGNOSIS — I350 Nonrheumatic aortic (valve) stenosis: Secondary | ICD-10-CM

## 2024-04-12 LAB — ECHOCARDIOGRAM COMPLETE
AR max vel: 1.1 cm2
AV Area VTI: 1.25 cm2
AV Area mean vel: 1.27 cm2
AV Mean grad: 13 mmHg
AV Peak grad: 25.2 mmHg
Ao pk vel: 2.51 m/s
Area-P 1/2: 1.87 cm2
MV M vel: 1.75 m/s
MV Peak grad: 12.3 mmHg
S' Lateral: 2.6 cm

## 2024-04-15 ENCOUNTER — Ambulatory Visit: Payer: Self-pay | Admitting: Cardiology
